# Patient Record
Sex: Female | Born: 1935
Health system: Southern US, Community
[De-identification: ages and names within clinical notes are randomized; demographics above are authoritative.]

## PROBLEM LIST (undated history)

## (undated) DIAGNOSIS — E079 Disorder of thyroid, unspecified: Secondary | ICD-10-CM

## (undated) DIAGNOSIS — K5790 Diverticulosis of intestine, part unspecified, without perforation or abscess without bleeding: Secondary | ICD-10-CM

## (undated) DIAGNOSIS — R740 Nonspecific elevation of levels of transaminase and lactic acid dehydrogenase [LDH]: Secondary | ICD-10-CM

## (undated) DIAGNOSIS — F411 Generalized anxiety disorder: Secondary | ICD-10-CM

## (undated) DIAGNOSIS — L02214 Cutaneous abscess of groin: Secondary | ICD-10-CM

## (undated) DIAGNOSIS — I10 Essential (primary) hypertension: Secondary | ICD-10-CM

## (undated) DIAGNOSIS — I872 Venous insufficiency (chronic) (peripheral): Secondary | ICD-10-CM

## (undated) DIAGNOSIS — R748 Abnormal levels of other serum enzymes: Secondary | ICD-10-CM

## (undated) DIAGNOSIS — E785 Hyperlipidemia, unspecified: Secondary | ICD-10-CM

## (undated) DIAGNOSIS — H8109 Meniere's disease, unspecified ear: Secondary | ICD-10-CM

## (undated) DIAGNOSIS — E039 Hypothyroidism, unspecified: Secondary | ICD-10-CM

## (undated) DIAGNOSIS — Z9109 Other allergy status, other than to drugs and biological substances: Secondary | ICD-10-CM

## (undated) DIAGNOSIS — B019 Varicella without complication: Secondary | ICD-10-CM

## (undated) DIAGNOSIS — R590 Localized enlarged lymph nodes: Secondary | ICD-10-CM

## (undated) DIAGNOSIS — A449 Bartonellosis, unspecified: Secondary | ICD-10-CM

## (undated) DIAGNOSIS — C4491 Basal cell carcinoma of skin, unspecified: Secondary | ICD-10-CM

## (undated) DIAGNOSIS — B159 Hepatitis A without hepatic coma: Secondary | ICD-10-CM

## (undated) DIAGNOSIS — R7402 Elevation of levels of lactic acid dehydrogenase (LDH): Secondary | ICD-10-CM

## (undated) DIAGNOSIS — H3530 Unspecified macular degeneration: Secondary | ICD-10-CM

## (undated) HISTORY — DX: Hyperlipidemia, unspecified: E78.5

## (undated) HISTORY — DX: Meniere's disease, unspecified ear: H81.09

## (undated) HISTORY — DX: Bartonellosis, unspecified: A44.9

## (undated) HISTORY — DX: Disorder of thyroid, unspecified: E07.9

## (undated) HISTORY — DX: Varicella without complication: B01.9

## (undated) HISTORY — DX: Abnormal levels of other serum enzymes: R74.8

## (undated) HISTORY — DX: Localized enlarged lymph nodes: R59.0

## (undated) HISTORY — PX: BLEPHAROPLASTY: SUR158

## (undated) HISTORY — DX: Unspecified macular degeneration: H35.30

## (undated) HISTORY — DX: Elevation of levels of lactic acid dehydrogenase (LDH): R74.02

## (undated) HISTORY — DX: Venous insufficiency (chronic) (peripheral): I87.2

## (undated) HISTORY — DX: Hepatitis a without hepatic coma: B15.9

## (undated) HISTORY — DX: Essential (primary) hypertension: I10

## (undated) HISTORY — DX: Other allergy status, other than to drugs and biological substances: Z91.09

## (undated) HISTORY — DX: Diverticulosis of intestine, part unspecified, without perforation or abscess without bleeding: K57.90

## (undated) HISTORY — DX: Hypothyroidism, unspecified: E03.9

## (undated) HISTORY — DX: Cutaneous abscess of groin: L02.214

## (undated) HISTORY — PX: CATARACT EXTRACTION W/ INTRAOCULAR LENS IMPLANT: SHX1309

## (undated) HISTORY — DX: Generalized anxiety disorder: F41.1

## (undated) HISTORY — DX: Basal cell carcinoma of skin, unspecified: C44.91

## (undated) HISTORY — DX: Nonspecific elevation of levels of transaminase and lactic acid dehydrogenase (ldh): R74.0

---

## 1965-02-05 HISTORY — PX: APPENDECTOMY: SHX54

## 1965-02-05 HISTORY — PX: CHOLECYSTECTOMY: SHX55

## 1989-02-05 HISTORY — PX: ABDOMINAL HYSTERECTOMY: SHX81

## 1995-02-06 DIAGNOSIS — C4491 Basal cell carcinoma of skin, unspecified: Secondary | ICD-10-CM

## 1995-02-06 HISTORY — PX: BASAL CELL CARCINOMA EXCISION: SHX1214

## 1995-02-06 HISTORY — DX: Basal cell carcinoma of skin, unspecified: C44.91

## 2015-12-15 ENCOUNTER — Encounter: Payer: Self-pay | Admitting: Family Medicine

## 2016-01-10 ENCOUNTER — Encounter: Payer: Self-pay | Admitting: Family Medicine

## 2016-01-10 ENCOUNTER — Ambulatory Visit (INDEPENDENT_AMBULATORY_CARE_PROVIDER_SITE_OTHER): Payer: Commercial Managed Care - HMO | Admitting: Family Medicine

## 2016-01-10 VITALS — BP 129/84 | HR 76 | Temp 98.2°F | Resp 20 | Ht 64.5 in | Wt 154.5 lb

## 2016-01-10 DIAGNOSIS — E079 Disorder of thyroid, unspecified: Secondary | ICD-10-CM | POA: Insufficient documentation

## 2016-01-10 DIAGNOSIS — H8109 Meniere's disease, unspecified ear: Secondary | ICD-10-CM | POA: Diagnosis not present

## 2016-01-10 DIAGNOSIS — Z7689 Persons encountering health services in other specified circumstances: Secondary | ICD-10-CM

## 2016-01-10 DIAGNOSIS — Z23 Encounter for immunization: Secondary | ICD-10-CM | POA: Diagnosis not present

## 2016-01-10 DIAGNOSIS — R8290 Unspecified abnormal findings in urine: Secondary | ICD-10-CM

## 2016-01-10 DIAGNOSIS — F411 Generalized anxiety disorder: Secondary | ICD-10-CM | POA: Insufficient documentation

## 2016-01-10 DIAGNOSIS — E785 Hyperlipidemia, unspecified: Secondary | ICD-10-CM | POA: Insufficient documentation

## 2016-01-10 DIAGNOSIS — R350 Frequency of micturition: Secondary | ICD-10-CM | POA: Diagnosis not present

## 2016-01-10 DIAGNOSIS — I10 Essential (primary) hypertension: Secondary | ICD-10-CM | POA: Insufficient documentation

## 2016-01-10 DIAGNOSIS — I872 Venous insufficiency (chronic) (peripheral): Secondary | ICD-10-CM

## 2016-01-10 DIAGNOSIS — E039 Hypothyroidism, unspecified: Secondary | ICD-10-CM | POA: Insufficient documentation

## 2016-01-10 DIAGNOSIS — H3530 Unspecified macular degeneration: Secondary | ICD-10-CM | POA: Insufficient documentation

## 2016-01-10 LAB — POC URINALSYSI DIPSTICK (AUTOMATED)
BILIRUBIN UA: NEGATIVE
Glucose, UA: NEGATIVE
KETONES UA: NEGATIVE
NITRITE UA: NEGATIVE
PH UA: 7.5
PROTEIN UA: NEGATIVE
Spec Grav, UA: 1.015
Urobilinogen, UA: 0.2

## 2016-01-10 MED ORDER — LISINOPRIL 5 MG PO TABS: 5.0000 mg | ORAL_TABLET | Freq: Every day | ORAL | 1 refills | Status: DC

## 2016-01-10 MED ORDER — NITROFURANTOIN MONOHYD MACRO 100 MG PO CAPS: 100.0000 mg | ORAL_CAPSULE | Freq: Two times a day (BID) | ORAL | 0 refills | Status: DC

## 2016-01-10 MED ORDER — TRIAMTERENE-HCTZ 37.5-25 MG PO CAPS: 1.0000 | ORAL_CAPSULE | Freq: Every day | ORAL | 1 refills | Status: DC

## 2016-01-10 MED ORDER — TRAZODONE HCL 50 MG PO TABS: 25.0000 mg | ORAL_TABLET | Freq: Every evening | ORAL | 0 refills | Status: DC | PRN

## 2016-01-10 MED ORDER — LEVOTHYROXINE SODIUM 125 MCG PO TABS: 125.0000 ug | ORAL_TABLET | Freq: Every day | ORAL | 1 refills | Status: DC

## 2016-01-10 NOTE — Patient Instructions (Signed)
It was a pleasure to meet you today.  I have called in an antibiotic at [facility] for your UTI. We will call you when your culture returns.  I have also called in your BP meds to [facility], follow up on chronic medical conditions every 6 months and your physical and medical wellness yearly.

## 2016-01-10 NOTE — Progress Notes (Signed)
Patient ID: Denise Fuller, female  DOB: September 24, 1935, 80 y.o.   MRN: [id number] Patient Care Team    Relationship Specialty Notifications Start End  Ma Hillock, DO PCP - General Family Medicine  01/10/16   Otelia Sergeant, OD Referring Physician   01/10/16    Comment: ophth- macular degeneration    Subjective:  Denise Fuller is a 80 y.o.  female present for new patient establishment. All past medical history, surgical history, allergies, family history, immunizations, medications and social history were obtained and entered in the electronic medical record today. All recent labs, ED visits and hospitalizations within the last year were reviewed. Pt is transferring from [place] secondary to her PCP retiring. She moved from [place] 05/2014.   Chronic medical conditions: Pt is being treated for Hypertension/hyperlipidemia and hypothyroidism through her prior PCP. Her last labs were collected 04/2015. She reports compliance with Dyazide, lisinopril  and synthroid daily. She takes 81 mg ASA daily.  04/27/2015:    - CBC, CMP WNL    - TSH 0.976   - lipid panel: Tchol 220, LDL 134, HDL 65. Pt currently not on medications.   Dysuria: Pt has complaint of dysuria of 1 week duration, after recent travel. She has taken AZO and cranberry extract to help with her symptoms.  She endorses urinary frequency, dysuria and (chronic) mild incontinence. She denies fever, chills, low back pain  or nausea.   Insomnia/anxiety: pt reports she very infrequently uses ativan 0.25 mg to fall asleep at night. She states she has noticed she is more sensitive to the medication and it "knocks her out". She reports she has taken this medication for multiple years. She has a current script. She reports she does have a "sleep hangover" with use of this medication and other sleeping pills.   Health maintenance:  Immunizations: tdap 1/12012, Influenza updated today (encouraged yearly), PNA series prevnar 13 10/2014 and  pneumovax 2012, zostavax completed 2012. DEXA: 2012 thorugh life [facility], and was "normal" per her report.   Depression screen PHQ 2/9 01/10/2016  Decreased Interest 0  Down, Depressed, Hopeless 0  PHQ - 2 Score 0    Immunization History  Administered Date(s) Administered  . Influenza, High Dose Seasonal PF 01/10/2016  . Influenza-Unspecified 10/27/2014  . Pneumococcal Conjugate-13 10/27/2014    Past Medical History:  Diagnosis Date  . BCC (basal cell carcinoma of skin) 1997   nose  . Chickenpox   . Chronic venous insufficiency   . Diverticulosis   . Elevated alkaline phosphatase level   . Elevated LDH   . Environmental allergies   . GAD (generalized anxiety disorder)   . Groin abscess    culture negative, doxy resolved abscess  . Hepatitis A   . Hyperlipidemia   . Hypertension   . Hypothyroidism   . Macular degeneration   . Meniere disease   . Thyroid disease    Allergies  Allergen Reactions  . Keflex [Cephalexin] Hives and Itching   Past Surgical History:  Procedure Laterality Date  . ABDOMINAL HYSTERECTOMY  1991  . APPENDECTOMY  1967  . BASAL CELL CARCINOMA EXCISION  1997   nose  . BLEPHAROPLASTY    . CHOLECYSTECTOMY  1967   Family History  Problem Relation Age of Onset  . Arthritis Mother   . Heart disease Mother   . Arthritis Father   . Aneurysm Father 43    brain  . Thyroid disease Daughter    Social History  Social History  . Marital status: Married    Spouse name: [name]  . Number of children: 3  . Years of education: N/A   Occupational History  . retired    Social History Main Topics  . Smoking status: Never Smoker  . Smokeless tobacco: Never Used  . Alcohol use No  . Drug use: No  . Sexual activity: Yes    Partners: Male     Comment: married   Other Topics Concern  . Not on file   Social History Narrative   Married to [place]. They have 3 children [name])   Retired.    Drinks caffeine, takes herbal remedies and  dialy vitamin.   Wears her seatbelt, smoke detector in the home   Wears dentures, no assistive devices for walking - independent.    Feels safe in her relationships.      Medication List       Accurate as of 01/10/16 10:07 AM. Always use your most recent med list.          aspirin EC 81 MG tablet Take 81 mg by mouth daily.   Biotin 5000 MCG Tabs Take by mouth.   calcium citrate-vitamin D 315-200 MG-UNIT tablet Commonly known as:  CITRACAL+D Take 1 tablet by mouth 2 (two) times daily.   co-enzyme Q-10 30 MG capsule Take 30 mg by mouth 3 (three) times daily.   Cranberry 500 MG Tabs Take 1 tablet by mouth 2 (two) times daily.   FLAX SEED OIL PO Take 1,300 mg by mouth 2 (two) times daily.   glucosamine-chondroitin 500-400 MG tablet Take 1 tablet by mouth 3 (three) times daily.   levothyroxine 125 MCG tablet Commonly known as:  SYNTHROID, LEVOTHROID Take 1 tablet (125 mcg total) by mouth daily.   lisinopril 5 MG tablet Commonly known as:  PRINIVIL,ZESTRIL Take 1 tablet (5 mg total) by mouth daily.   LORazepam 0.5 MG tablet Commonly known as:  ATIVAN Take 0.5 mg by mouth as needed.   nitrofurantoin (macrocrystal-monohydrate) 100 MG capsule Commonly known as:  MACROBID Take 1 capsule (100 mg total) by mouth 2 (two) times daily.   OMEGA-3 COMPLEX PO Take 360 mg by mouth daily.   polyethylene glycol packet Commonly known as:  MIRALAX / GLYCOLAX Take 17 g by mouth daily.   traZODone 50 MG tablet Commonly known as:  DESYREL Take 0.5-1 tablets (25-50 mg total) by mouth at bedtime as needed for sleep.   triamterene-hydrochlorothiazide 37.5-25 MG capsule Commonly known as:  DYAZIDE Take 1 each (1 capsule total) by mouth daily.   Vitamin D3 1000 units Caps Take by mouth.        Recent Results (from the past 2160 hour(s))  POCT Urinalysis Dipstick (Automated)     Status: Abnormal   Collection Time: 01/10/16  9:28 AM  Result Value Ref Range   Color, UA  yellow    Clarity, UA cloudy    Glucose, UA negative    Bilirubin, UA negative    Ketones, UA negative    Spec Grav, UA 1.015    Blood, UA small    pH, UA 7.5    Protein, UA negative    Urobilinogen, UA 0.2    Nitrite, UA negative    Leukocytes, UA large (3+) (A) Negative    Patient was never admitted.   ROS: 14 pt review of systems performed and negative (unless mentioned in an HPI)  Objective: BP 129/84 (BP Location: Left Arm, Patient  Position: Sitting, Cuff Size: Normal)   Pulse 76   Temp 98.2 F (36.8 C)   Resp 20   Ht 5' 4.5" (1.638 m)   Wt 154 lb 8 oz (70.1 kg)   SpO2 98%   BMI 26.11 kg/m  Gen: Afebrile. No acute distress. Nontoxic in appearance, well-developed, well-nourished, pleasant caucasian female.  HENT: AT. [place]. MMM Eyes:Pupils Equal Round Reactive to light, Extraocular movements intact,  Conjunctiva without redness, discharge or icterus. Neck/lymp/endocrine: Supple,mild chronic left ant cervical  lymphadenopathy, no thyromegaly CV: RRR no murmur, no edema, +2/4 P posterior tibialis pulses. no carotid bruits. No JVD. Chest: CTAB, no wheeze, rhonchi or crackles.  Abd: Soft. NTND. BS present. . Skin:  Warm and well-perfused. Skin intact. Neuro/Msk: Normal gait. PERLA. EOMi. Alert. Oriented x3.   Psych: Normal affect, dress and demeanor. Normal speech. Normal thought content and judgment.  Assessment/plan: Denise Fuller is a 80 y.o. female present for establishment of care with complaints of dysuria.   Hypothyroidism, unspecified type - stable - last TSH WNL by records in 04/2015, continue current dose, refills provided - levothyroxine (SYNTHROID, LEVOTHROID) 125 MCG tablet; Take 1 tablet (125 mcg total) by mouth daily.  Dispense: 90 tablet; Refill: 1 - will follow yearly (during yearly).   Essential hypertension - stable - low salt, diet and exercise.  - continue current regimen, well controlled.  - triamterene-hydrochlorothiazide (DYAZIDE) 37.5-25 MG  capsule; Take 1 each (1 capsule total) by mouth daily.  Dispense: 90 capsule; Refill: 1 - lisinopril (PRINIVIL,ZESTRIL) 5 MG tablet; Take 1 tablet (5 mg total) by mouth daily.  Dispense: 90 tablet; Refill: 1  Hyperlipidemia, unspecified hyperlipidemia type - stable H/o with last lipids only mildly elevated LDL 134, 04/2015.  Fasting labs with yearly in March. Currently not on medications, consider fish oil supplement if elevated.    Urinary frequency/abnormal  - new - POCT Urinalysis Dipstick (Automated): +leuks and blood.  - urine culture ordered.  - macrobid 100 mg BId x 7 days  Meniere's disease, unspecified laterality H/o, no complications.   Macular degeneration - follows every 6 months with ophthalmologist.  GAD (generalized anxiety disorder)/sleeping disorder - change in therapy - DC ativan - start trazodone 25-50 mg QHS.   Chronic venous insufficiency H/o, pt has compression stockings, but does not like to wear them. Encourage use.  Need for prophylactic vaccination and inoculation against influenza - Flu vaccine HIGH DOSE PF (Fluzone High dose)     Return in about 4 months (around 04/30/2016) for CPE, Medicare Wellness (31m).  Electronically signed by: Howard Pouch, DO [place]

## 2016-01-12 LAB — URINE CULTURE

## 2016-01-13 ENCOUNTER — Telehealth: Payer: Self-pay | Admitting: Family Medicine

## 2016-01-13 NOTE — Telephone Encounter (Signed)
Please call pt: - her urine culture was positive for E.coli bacteria. The abx she was placed on has coverage for this bacteria. Continue medication. No need to followup on this condition, as long as improving.   Yearly physical due end of March

## 2016-01-13 NOTE — Telephone Encounter (Signed)
Spoke with patient reviewed information and instructions. Patient verbalized understanding. 

## 2016-02-01 ENCOUNTER — Telehealth: Payer: Self-pay | Admitting: Family Medicine

## 2016-02-01 NOTE — Telephone Encounter (Signed)
Patient said she did get a little better over Christmas but has gotten worse.

## 2016-02-01 NOTE — Telephone Encounter (Signed)
Pt will need an appt with urine sample in order to provide care. Thank you.

## 2016-02-01 NOTE — Telephone Encounter (Signed)
Patient still experiencing urinary burning, no fever, no headache. Feels like she still have UTI. Please call.

## 2016-02-01 NOTE — Telephone Encounter (Signed)
Patient called back. Can she get a different antibiotic? Her urine is cloudy.

## 2016-02-01 NOTE — Telephone Encounter (Signed)
Called patient Denise Fuller scheduled patient for an appt.

## 2016-02-02 ENCOUNTER — Ambulatory Visit (INDEPENDENT_AMBULATORY_CARE_PROVIDER_SITE_OTHER): Payer: Commercial Managed Care - HMO | Admitting: Family Medicine

## 2016-02-02 ENCOUNTER — Encounter: Payer: Self-pay | Admitting: Family Medicine

## 2016-02-02 VITALS — BP 130/88 | HR 91 | Temp 98.0°F | Resp 20 | Wt 155.0 lb

## 2016-02-02 DIAGNOSIS — N390 Urinary tract infection, site not specified: Secondary | ICD-10-CM

## 2016-02-02 DIAGNOSIS — R8290 Unspecified abnormal findings in urine: Secondary | ICD-10-CM | POA: Diagnosis not present

## 2016-02-02 DIAGNOSIS — R350 Frequency of micturition: Secondary | ICD-10-CM

## 2016-02-02 LAB — POC URINALSYSI DIPSTICK (AUTOMATED)
Bilirubin, UA: NEGATIVE
Glucose, UA: NEGATIVE
Ketones, UA: NEGATIVE
NITRITE UA: NEGATIVE
PH UA: 6
PROTEIN UA: NEGATIVE
Spec Grav, UA: 1.015
Urobilinogen, UA: 1

## 2016-02-02 MED ORDER — CIPROFLOXACIN HCL 500 MG PO TABS: 500.0000 mg | ORAL_TABLET | Freq: Two times a day (BID) | ORAL | 0 refills | Status: DC

## 2016-02-02 NOTE — Patient Instructions (Signed)
It was a great to see you again.  Start the cipro every 12 hours for 7 days.  Drink plenty of water.  I want you to have a urine test after treatment on [id number] to make certain all bacteria resolved.

## 2016-02-02 NOTE — Progress Notes (Signed)
Patient ID: Denise Fuller, female  DOB: 08-02-1935, 80 y.o.   MRN: [id number] Patient Care Team    Relationship Specialty Notifications Start End  Ma Hillock, DO PCP - General Family Medicine  01/10/16   Otelia Sergeant, OD Referring Physician   01/10/16    Comment: ophth- macular degeneration    Subjective:  Denise Fuller is a 80 y.o.  female present for Recurrent UTI sx  Dysuria: Patient presents today for recurrent UTI symptoms after treatment with Macrobid for Escherichia coli UTI last month. Patient states that she had a few days in which she had complete resolution of her symptoms, and a symptoms slowly started to return. She endorses urinary frequency, mild dysuria, and feeling "off". She denies fever, chills, nausea, vomit or low back pain. She states she has had a few urinary tract infections in the past, but approximately the last few years. She has had bladder sling surgery, and endorses having continued bladder prolapse and urinary incontinence that is mild. She states at this time she does not desire to have the surgery repeated. She is allergic to Keflex.    Immunization History  Administered Date(s) Administered  . Influenza, High Dose Seasonal PF 01/10/2016  . Influenza-Unspecified 10/27/2014  . Pneumococcal Conjugate-13 10/27/2014    Past Medical History:  Diagnosis Date  . Bartonella infection   . BCC (basal cell carcinoma of skin) 1997   nose  . Chickenpox   . Chronic venous insufficiency   . Diverticulosis   . Elevated alkaline phosphatase level   . Elevated LDH   . Environmental allergies   . GAD (generalized anxiety disorder)   . Groin abscess    culture negative, doxy resolved abscess  . Hepatitis A   . Hyperlipidemia   . Hypertension   . Hypothyroidism   . Lymphadenopathy, cervical    chronic per pt. (left)  . Macular degeneration   . Meniere disease   . Thyroid disease    Allergies  Allergen Reactions  . Keflex [Cephalexin] Hives and  Itching   Past Surgical History:  Procedure Laterality Date  . ABDOMINAL HYSTERECTOMY  1991  . APPENDECTOMY  1967  . BASAL CELL CARCINOMA EXCISION  1997   nose  . BLEPHAROPLASTY    . CHOLECYSTECTOMY  1967   Family History  Problem Relation Age of Onset  . Arthritis Mother   . Heart disease Mother   . Arthritis Father   . Aneurysm Father 55    brain  . Thyroid disease Daughter    Social History   Social History  . Marital status: Married    Spouse name: [name]  . Number of children: 3  . Years of education: N/A   Occupational History  . retired    Social History Main Topics  . Smoking status: Never Smoker  . Smokeless tobacco: Never Used  . Alcohol use No  . Drug use: No  . Sexual activity: Yes    Partners: Male     Comment: married   Other Topics Concern  . Not on file   Social History Narrative   Retired. Married to [place]. They have 3 children [name])   [place] from [place] 05/2014.    Has 2 older dogs.    Retired.    Drinks caffeine, takes herbal remedies and dialy vitamin.   Wears her seatbelt, smoke detector in the home   Wears dentures, no assistive devices for walking - independent.  Feels safe in her relationships.    Allergies as of 02/02/2016      Reactions   Keflex [cephalexin] Hives, Itching      Medication List       Accurate as of 02/02/16 10:55 AM. Always use your most recent med list.          aspirin EC 81 MG tablet Take 81 mg by mouth daily.   Biotin 5000 MCG Tabs Take by mouth.   calcium citrate-vitamin D 315-200 MG-UNIT tablet Commonly known as:  CITRACAL+D Take 1 tablet by mouth 2 (two) times daily.   co-enzyme Q-10 30 MG capsule Take 30 mg by mouth 3 (three) times daily.   Cranberry 500 MG Tabs Take 1 tablet by mouth 2 (two) times daily.   FLAX SEED OIL PO Take 1,300 mg by mouth 2 (two) times daily.   glucosamine-chondroitin 500-400 MG tablet Take 1 tablet by mouth 3 (three) times daily.     levothyroxine 125 MCG tablet Commonly known as:  SYNTHROID, LEVOTHROID Take 1 tablet (125 mcg total) by mouth daily.   lisinopril 5 MG tablet Commonly known as:  PRINIVIL,ZESTRIL Take 1 tablet (5 mg total) by mouth daily.   LORazepam 0.5 MG tablet Commonly known as:  ATIVAN Take 0.5 mg by mouth as needed.   OMEGA-3 COMPLEX PO Take 360 mg by mouth daily.   polyethylene glycol packet Commonly known as:  MIRALAX / GLYCOLAX Take 17 g by mouth daily.   traZODone 50 MG tablet Commonly known as:  DESYREL Take 0.5-1 tablets (25-50 mg total) by mouth at bedtime as needed for sleep.   triamterene-hydrochlorothiazide 37.5-25 MG capsule Commonly known as:  DYAZIDE Take 1 each (1 capsule total) by mouth daily.   Vitamin D3 1000 units Caps Take by mouth.       ROS: 14 pt review of systems performed and negative (unless mentioned in an HPI)  Objective: BP 130/88 (BP Location: Right Arm, Patient Position: Sitting, Cuff Size: Normal)   Pulse 91   Temp 98 F (36.7 C)   Resp 20   Wt 155 lb (70.3 kg)   SpO2 98%   BMI 26.19 kg/m  Gen: Afebrile. No acute distress. Nontoxic in appearance, well-developed, well-nourished, Caucasian female. Very pleasant. HENT: AT. .  MMM.  Eyes:Pupils Equal Round Reactive to light, Extraocular movements intact,  Conjunctiva without redness, discharge or icterus. Abd: Soft. Flat. NTND. BS present. No Masses palpated.  MSK: No CVA tenderness bilaterally. Neuro:  Normal gait. PERLA. EOMi. Alert. Oriented.   Results for orders placed or performed in visit on 02/02/16 (from the past 24 hour(s))  POCT Urinalysis Dipstick (Automated)     Status: Abnormal   Collection Time: 02/02/16 10:47 AM  Result Value Ref Range   Color, UA yellow    Clarity, UA cloudy    Glucose, UA negative    Bilirubin, UA negative    Ketones, UA negative    Spec Grav, UA 1.015    Blood, UA small    pH, UA 6.0    Protein, UA negative    Urobilinogen, UA 1.0    Nitrite, UA  negative    Leukocytes, UA large (3+) (A) Negative    Assessment/plan: Denise Fuller is a 80 y.o. female present for with complaints of dysuria.  Urinary frequency/Abnormal urine findings/Recurrent UTI - POCT Urinalysis Dipstick (Automated): Positive findings consistent with recurrent UTI. - Urine Culture - Discussed the need to return today, and for future appointment if she is  having urinary tract infection symptoms. Patient understands what she needs to be seen for this condition. - Discussed trying different therapy with ciprofloxacin, and she is agreeable today. Although prior UTI was sensitive to the [place], lengthy discussion surrounding possible reasons of her having reoccurrence with her bladder prolapse progressing. - Patient will provide a urine sample to send for micro reflex and culture 3 days after being treated again for this UTI. No appointment needed unless symptoms are not resolved. - If urine test positive in early January, would favor sending to urology. Patient will be called with results from urine culture collected today and repeat results collected in early January. Patient is in agreement with this plan. Future order placed for urine.  > 25 minutes spent with patient, >50% of time spent face to face counseling and/or coordinating care   Electronically signed by: Howard Pouch, [place]

## 2016-02-04 LAB — URINE CULTURE: Colony Count: >=100000

## 2016-02-07 ENCOUNTER — Telehealth: Payer: Self-pay | Admitting: Family Medicine

## 2016-02-07 MED ORDER — SULFAMETHOXAZOLE-TRIMETHOPRIM 800-160 MG PO TABS: 1.0000 | ORAL_TABLET | Freq: Two times a day (BID) | ORAL | 0 refills | Status: DC

## 2016-02-07 NOTE — Telephone Encounter (Signed)
Please call pt: - her urine culture is positive for E.COli, it is however resistant to the Cipro we placed her on.  - I have called in another script, different abx.  - I had asked her to have a urine test after finishing cipro, since we are changing abx, I want her to postpone the retest until 3 days after abx has finished.  - bactrim prescribed BID for 10 days

## 2016-02-07 NOTE — Telephone Encounter (Signed)
Left message for patient to call back to review results and new instructions.

## 2016-02-07 NOTE — Telephone Encounter (Signed)
Patient notified and verbalized understanding. 

## 2016-02-27 ENCOUNTER — Other Ambulatory Visit: Payer: Commercial Managed Care - HMO

## 2016-02-27 ENCOUNTER — Telehealth: Payer: Self-pay | Admitting: Family Medicine

## 2016-02-27 DIAGNOSIS — N390 Urinary tract infection, site not specified: Secondary | ICD-10-CM

## 2016-02-27 LAB — URINALYSIS, ROUTINE W REFLEX MICROSCOPIC
Bilirubin Urine: NEGATIVE
Glucose, UA: NEGATIVE
HGB URINE DIPSTICK: NEGATIVE
Ketones, ur: NEGATIVE
LEUKOCYTES UA: NEGATIVE
Nitrite: NEGATIVE
PH: 7 (ref 5.0–8.0)
Protein, ur: NEGATIVE
SPECIFIC GRAVITY, URINE: 1.007 (ref 1.001–1.035)

## 2016-02-27 NOTE — Telephone Encounter (Signed)
Future orders in Epic patient to be added to lab schedule [name] aware to release orders.

## 2016-02-27 NOTE — Telephone Encounter (Signed)
Patient brought in urine specimen. It is in the lab. She said she was advised to bring it in due to several rounds of antibiotics.

## 2016-02-28 LAB — URINE CULTURE: Organism ID, Bacteria: NO GROWTH

## 2016-02-29 ENCOUNTER — Telehealth: Payer: Self-pay | Admitting: Family Medicine

## 2016-02-29 NOTE — Telephone Encounter (Signed)
Patient notified and verbalized understanding. 

## 2016-02-29 NOTE — Telephone Encounter (Signed)
Please call pt: Her urine culture is completely normal. Full resolution of infection.

## 2016-04-23 ENCOUNTER — Encounter: Payer: Self-pay | Admitting: Family Medicine

## 2016-04-23 ENCOUNTER — Ambulatory Visit (INDEPENDENT_AMBULATORY_CARE_PROVIDER_SITE_OTHER): Payer: Medicare HMO | Admitting: Family Medicine

## 2016-04-23 VITALS — BP 126/80 | HR 66 | Temp 98.0°F | Resp 20 | Ht 65.0 in | Wt 156.5 lb

## 2016-04-23 DIAGNOSIS — I10 Essential (primary) hypertension: Secondary | ICD-10-CM

## 2016-04-23 DIAGNOSIS — E039 Hypothyroidism, unspecified: Secondary | ICD-10-CM | POA: Diagnosis not present

## 2016-04-23 DIAGNOSIS — Z6826 Body mass index (BMI) 26.0-26.9, adult: Secondary | ICD-10-CM | POA: Diagnosis not present

## 2016-04-23 DIAGNOSIS — Z0000 Encounter for general adult medical examination without abnormal findings: Secondary | ICD-10-CM | POA: Diagnosis not present

## 2016-04-23 DIAGNOSIS — E785 Hyperlipidemia, unspecified: Secondary | ICD-10-CM | POA: Diagnosis not present

## 2016-04-23 DIAGNOSIS — Z131 Encounter for screening for diabetes mellitus: Secondary | ICD-10-CM | POA: Diagnosis not present

## 2016-04-23 LAB — CBC WITH DIFFERENTIAL/PLATELET
Basophils Absolute: 0 10*3/uL (ref 0.0–0.1)
Basophils Relative: 0.3 % (ref 0.0–3.0)
EOS ABS: 0.2 10*3/uL (ref 0.0–0.7)
Eosinophils Relative: 2.8 % (ref 0.0–5.0)
HEMATOCRIT: 43.5 % (ref 36.0–46.0)
HEMOGLOBIN: 14.4 g/dL (ref 12.0–15.0)
LYMPHS PCT: 38.7 % (ref 12.0–46.0)
Lymphs Abs: 2.4 10*3/uL (ref 0.7–4.0)
MCHC: 33.1 g/dL (ref 30.0–36.0)
MCV: 90.7 fl (ref 78.0–100.0)
MONO ABS: 1 10*3/uL (ref 0.1–1.0)
Monocytes Relative: 15.8 % — ABNORMAL HIGH (ref 3.0–12.0)
Neutro Abs: 2.6 10*3/uL (ref 1.4–7.7)
Neutrophils Relative %: 42.4 % — ABNORMAL LOW (ref 43.0–77.0)
Platelets: 305 10*3/uL (ref 150.0–400.0)
RBC: 4.8 Mil/uL (ref 3.87–5.11)
RDW: 14.3 % (ref 11.5–15.5)
WBC: 6.2 10*3/uL (ref 4.0–10.5)

## 2016-04-23 LAB — LIPID PANEL
CHOL/HDL RATIO: 4
Cholesterol: 256 mg/dL — ABNORMAL HIGH (ref 0–200)
HDL: 67.5 mg/dL (ref >39.00)
LDL CALC: 161 mg/dL — AB (ref 0–99)
NonHDL: 188.16
TRIGLYCERIDES: 136 mg/dL (ref 0.0–149.0)
VLDL: 27.2 mg/dL (ref 0.0–40.0)

## 2016-04-23 LAB — BASIC METABOLIC PANEL
BUN: 11 mg/dL (ref 6–23)
CHLORIDE: 99 meq/L (ref 96–112)
CO2: 31 meq/L (ref 19–32)
Calcium: 9.9 mg/dL (ref 8.4–10.5)
Creatinine, Ser: 0.63 mg/dL (ref 0.40–1.20)
GFR: 97.04 mL/min (ref >60.00)
Glucose, Bld: 83 mg/dL (ref 70–99)
POTASSIUM: 4.2 meq/L (ref 3.5–5.1)
SODIUM: 137 meq/L (ref 135–145)

## 2016-04-23 LAB — TSH: TSH: 1.79 u[IU]/mL (ref 0.35–4.50)

## 2016-04-23 MED ORDER — TRAZODONE HCL 50 MG PO TABS: 25.0000 mg | ORAL_TABLET | Freq: Every evening | ORAL | 1 refills | Status: DC | PRN

## 2016-04-23 MED ORDER — LISINOPRIL 5 MG PO TABS: 5.0000 mg | ORAL_TABLET | Freq: Every day | ORAL | 1 refills | Status: DC

## 2016-04-23 MED ORDER — TRIAMTERENE-HCTZ 37.5-25 MG PO CAPS: 1.0000 | ORAL_CAPSULE | Freq: Every day | ORAL | 1 refills | Status: DC

## 2016-04-23 NOTE — Patient Instructions (Signed)
Health Maintenance, Female Adopting a healthy lifestyle and getting preventive care can go a long way to promote health and wellness. Talk with your health care provider about what schedule of regular examinations is right for you. This is a good chance for you to check in with your provider about disease prevention and staying healthy. In between checkups, there are plenty of things you can do on your own. Experts have done a lot of research about which lifestyle changes and preventive measures are most likely to keep you healthy. Ask your health care provider for more information. Weight and diet Eat a healthy diet  Be sure to include plenty of vegetables, fruits, low-fat dairy products, and lean protein.  Do not eat a lot of foods high in solid fats, added sugars, or salt.  Get regular exercise. This is one of the most important things you can do for your health.  Most adults should exercise for at least 150 minutes each week. The exercise should increase your heart rate and make you sweat (moderate-intensity exercise).  Most adults should also do strengthening exercises at least twice a week. This is in addition to the moderate-intensity exercise. Maintain a healthy weight  Body mass index (BMI) is a measurement that can be used to identify possible weight problems. It estimates body fat based on height and weight. Your health care provider can help determine your BMI and help you achieve or maintain a healthy weight.  For females 76 years of age and older:  A BMI below 18.5 is considered underweight.  A BMI of 18.5 to 24.9 is normal.  A BMI of 25 to 29.9 is considered overweight.  A BMI of 30 and above is considered obese. Watch levels of cholesterol and blood lipids  You should start having your blood tested for lipids and cholesterol at 79 years of age, then have this test every 5 years.  You may need to have your cholesterol levels checked more often if:  Your lipid or  cholesterol levels are high.  You are older than 79 years of age.  You are at high risk for heart disease. Cancer screening Lung Cancer  Lung cancer screening is recommended for adults 64-42 years old who are at high risk for lung cancer because of a history of smoking.  A yearly low-dose CT scan of the lungs is recommended for people who:  Currently smoke.  Have quit within the past 15 years.  Have at least a 30-pack-year history of smoking. A pack year is smoking an average of one pack of cigarettes a day for 1 year.  Yearly screening should continue until it has been 15 years since you quit.  Yearly screening should stop if you develop a health problem that would prevent you from having lung cancer treatment. Breast Cancer  Practice breast self-awareness. This means understanding how your breasts normally appear and feel.  It also means doing regular breast self-exams. Let your health care provider know about any changes, no matter how small.  If you are in your 20s or 30s, you should have a clinical breast exam (CBE) by a health care provider every 1-3 years as part of a regular health exam.  If you are 34 or older, have a CBE every year. Also consider having a breast X-ray (mammogram) every year.  If you have a family history of breast cancer, talk to your health care provider about genetic screening.  If you are at high risk for breast cancer, talk  to your health care provider about having an MRI and a mammogram every year.  Breast cancer gene (BRCA) assessment is recommended for women who have family members with BRCA-related cancers. BRCA-related cancers include:  Breast.  Ovarian.  Tubal.  Peritoneal cancers.  Results of the assessment will determine the need for genetic counseling and BRCA1 and BRCA2 testing. Cervical Cancer  Your health care provider may recommend that you be screened regularly for cancer of the pelvic organs (ovaries, uterus, and vagina).  This screening involves a pelvic examination, including checking for microscopic changes to the surface of your cervix (Pap test). You may be encouraged to have this screening done every 3 years, beginning at age 24.  For women ages 66-65, health care providers may recommend pelvic exams and Pap testing every 3 years, or they may recommend the Pap and pelvic exam, combined with testing for human papilloma virus (HPV), every 5 years. Some types of HPV increase your risk of cervical cancer. Testing for HPV may also be done on women of any age with unclear Pap test results.  Other health care providers may not recommend any screening for nonpregnant women who are considered low risk for pelvic cancer and who do not have symptoms. Ask your health care provider if a screening pelvic exam is right for you.  If you have had past treatment for cervical cancer or a condition that could lead to cancer, you need Pap tests and screening for cancer for at least 20 years after your treatment. If Pap tests have been discontinued, your risk factors (such as having a new sexual partner) need to be reassessed to determine if screening should resume. Some women have medical problems that increase the chance of getting cervical cancer. In these cases, your health care provider may recommend more frequent screening and Pap tests. Colorectal Cancer  This type of cancer can be detected and often prevented.  Routine colorectal cancer screening usually begins at 79 years of age and continues through 79 years of age.  Your health care provider may recommend screening at an earlier age if you have risk factors for colon cancer.  Your health care provider may also recommend using home test kits to check for hidden blood in the stool.  A small camera at the end of a tube can be used to examine your colon directly (sigmoidoscopy or colonoscopy). This is done to check for the earliest forms of colorectal cancer.  Routine  screening usually begins at age 41.  Direct examination of the colon should be repeated every 5-10 years through 79 years of age. However, you may need to be screened more often if early forms of precancerous polyps or small growths are found. Skin Cancer  Check your skin from head to toe regularly.  Tell your health care provider about any new moles or changes in moles, especially if there is a change in a mole's shape or color.  Also tell your health care provider if you have a mole that is larger than the size of a pencil eraser.  Always use sunscreen. Apply sunscreen liberally and repeatedly throughout the day.  Protect yourself by wearing long sleeves, pants, a wide-brimmed hat, and sunglasses whenever you are outside. Heart disease, diabetes, and high blood pressure  High blood pressure causes heart disease and increases the risk of stroke. High blood pressure is more likely to develop in:  People who have blood pressure in the high end of the normal range (130-139/85-89 mm Hg).  People who are overweight or obese.  People who are African American.  If you are 59-24 years of age, have your blood pressure checked every 3-5 years. If you are 34 years of age or older, have your blood pressure checked every year. You should have your blood pressure measured twice-once when you are at a hospital or clinic, and once when you are not at a hospital or clinic. Record the average of the two measurements. To check your blood pressure when you are not at a hospital or clinic, you can use:  An automated blood pressure machine at a pharmacy.  A home blood pressure monitor.  If you are between 29 years and 60 years old, ask your health care provider if you should take aspirin to prevent strokes.  Have regular diabetes screenings. This involves taking a blood sample to check your fasting blood sugar level.  If you are at a normal weight and have a low risk for diabetes, have this test once  every three years after 79 years of age.  If you are overweight and have a high risk for diabetes, consider being tested at a younger age or more often. Preventing infection Hepatitis B  If you have a higher risk for hepatitis B, you should be screened for this virus. You are considered at high risk for hepatitis B if:  You were born in a country where hepatitis B is common. Ask your health care provider which countries are considered high risk.  Your parents were born in a high-risk country, and you have not been immunized against hepatitis B (hepatitis B vaccine).  You have HIV or AIDS.  You use needles to inject street drugs.  You live with someone who has hepatitis B.  You have had sex with someone who has hepatitis B.  You get hemodialysis treatment.  You take certain medicines for conditions, including cancer, organ transplantation, and autoimmune conditions. Hepatitis C  Blood testing is recommended for:  Everyone born from [birth year] through 1963.  Anyone with known risk factors for hepatitis C. Sexually transmitted infections (STIs)  You should be screened for sexually transmitted infections (STIs) including gonorrhea and chlamydia if:  You are sexually active and are younger than 79 years of age.  You are older than 79 years of age and your health care provider tells you that you are at risk for this type of infection.  Your sexual activity has changed since you were last screened and you are at an increased risk for chlamydia or gonorrhea. Ask your health care provider if you are at risk.  If you do not have HIV, but are at risk, it may be recommended that you take a prescription medicine daily to prevent HIV infection. This is called pre-exposure prophylaxis (PrEP). You are considered at risk if:  You are sexually active and do not regularly use condoms or know the HIV status of your partner(s).  You take drugs by injection.  You are sexually active with a partner  who has HIV. Talk with your health care provider about whether you are at high risk of being infected with HIV. If you choose to begin PrEP, you should first be tested for HIV. You should then be tested every 3 months for as long as you are taking PrEP. Pregnancy  If you are premenopausal and you may become pregnant, ask your health care provider about preconception counseling.  If you may become pregnant, take 400 to 800 micrograms (mcg) of folic acid  every day.  If you want to prevent pregnancy, talk to your health care provider about birth control (contraception). Osteoporosis and menopause  Osteoporosis is a disease in which the bones lose minerals and strength with aging. This can result in serious bone fractures. Your risk for osteoporosis can be identified using a bone density scan.  If you are 4 years of age or older, or if you are at risk for osteoporosis and fractures, ask your health care provider if you should be screened.  Ask your health care provider whether you should take a calcium or vitamin D supplement to lower your risk for osteoporosis.  Menopause may have certain physical symptoms and risks.  Hormone replacement therapy may reduce some of these symptoms and risks. Talk to your health care provider about whether hormone replacement therapy is right for you. Follow these instructions at home:  Schedule regular health, dental, and eye exams.  Stay current with your immunizations.  Do not use any tobacco products including cigarettes, chewing tobacco, or electronic cigarettes.  If you are pregnant, do not drink alcohol.  If you are breastfeeding, limit how much and how often you drink alcohol.  Limit alcohol intake to no more than 1 drink per day for nonpregnant women. One drink equals 12 ounces of beer, 5 ounces of wine, or 1 ounces of hard liquor.  Do not use street drugs.  Do not share needles.  Ask your health care provider for help if you need support  or information about quitting drugs.  Tell your health care provider if you often feel depressed.  Tell your health care provider if you have ever been abused or do not feel safe at home. This information is not intended to replace advice given to you by your health care provider. Make sure you discuss any questions you have with your health care provider. Document Released: 08/07/2010 Document Revised: 06/30/2015 Document Reviewed: 10/26/2014 Elsevier Interactive Patient Education  2017 [employer].

## 2016-04-23 NOTE — Progress Notes (Signed)
Patient ID: Denise Fuller, female  DOB: October 06, 1935, 81 y.o.   MRN: [id number] Patient Care Team    Relationship Specialty Notifications Start End  Ma Hillock, DO PCP - General Family Medicine  01/10/16   Otelia Sergeant, OD Referring Physician   01/10/16    Comment: ophth- macular degeneration    Subjective:  Denise Fuller is a 81 y.o.  Female  present for CPE. All past medical history, surgical history, allergies, family history, immunizations, medications and social history were updated in the electronic medical record today. All recent labs, ED visits and hospitalizations within the last year were reviewed.   Health maintenance:  Immunizations: tdap 1/12012, Influenza updated today (encouraged yearly), PNA series prevnar 13 10/2014 and pneumovax 2012, zostavax completed 2012. DEXA: 2012 thorugh life line, and was "normal" per her report.  Colonoscopy: N/A, > 75 Mammogram: N/A, > 75, does not desire further screening.  Assistive device: none Oxygen VQQ:VZDG Patient has a Dental home. Hospitalizations/ED visits: none  Depression screen [facility] 2/9 04/23/2016 01/10/2016  Decreased Interest 0 0  Down, Depressed, Hopeless 0 0  PHQ - 2 Score 0 0    Current Exercise Habits: The patient does not participate in regular exercise at present Exercise limited by: None identified   Immunization History  Administered Date(s) Administered  . Influenza, High Dose Seasonal PF 01/10/2016  . Influenza-Unspecified 10/27/2014  . Pneumococcal Conjugate-13 10/27/2014     Past Medical History:  Diagnosis Date  . Bartonella infection   . BCC (basal cell carcinoma of skin) 1997   nose  . Chickenpox   . Chronic venous insufficiency   . Diverticulosis   . Elevated alkaline phosphatase level   . Elevated LDH   . Environmental allergies   . GAD (generalized anxiety disorder)   . Groin abscess    culture negative, doxy resolved abscess  . Hepatitis A   . Hyperlipidemia   . Hypertension    . Hypothyroidism   . Lymphadenopathy, cervical    chronic per pt. (left)  . Macular degeneration   . Meniere disease   . Thyroid disease    Allergies  Allergen Reactions  . Keflex [Cephalexin] Hives and Itching   Past Surgical History:  Procedure Laterality Date  . ABDOMINAL HYSTERECTOMY  1991  . APPENDECTOMY  1967  . BASAL CELL CARCINOMA EXCISION  1997   nose  . BLEPHAROPLASTY    . CHOLECYSTECTOMY  1967   Family History  Problem Relation Age of Onset  . Arthritis Mother   . Heart disease Mother   . Arthritis Father   . Aneurysm Father 51    brain  . Thyroid disease Daughter    Social History   Social History  . Marital status: Married    Spouse name: [name]  . Number of children: 3  . Years of education: N/A   Occupational History  . retired    Social History Main Topics  . Smoking status: Never Smoker  . Smokeless tobacco: Never Used  . Alcohol use No  . Drug use: No  . Sexual activity: Yes    Partners: Male     Comment: married   Other Topics Concern  . Not on file   Social History Narrative   Retired. Married to [place]. They have 3 children [name])   [place] from [place] 05/2014.    Has 2 older dogs.    Retired.    Drinks caffeine, takes herbal remedies and  dialy vitamin.   Wears her seatbelt, smoke detector in the home   Wears dentures, no assistive devices for walking - independent.    Feels safe in her relationships.    Allergies as of 04/23/2016      Reactions   Keflex [cephalexin] Hives, Itching      Medication List       Accurate as of 04/23/16  8:58 AM. Always use your most recent med list.          aspirin EC 81 MG tablet Take 81 mg by mouth daily.   Biotin 5000 MCG Tabs Take by mouth.   calcium citrate-vitamin D 315-200 MG-UNIT tablet Commonly known as:  CITRACAL+D Take 1 tablet by mouth 2 (two) times daily.   co-enzyme Q-10 30 MG capsule Take 30 mg by mouth 3 (three) times daily.   Cranberry 500 MG  Tabs Take 1 tablet by mouth 2 (two) times daily.   FLAX SEED OIL PO Take 1,300 mg by mouth 2 (two) times daily.   glucosamine-chondroitin 500-400 MG tablet Take 1 tablet by mouth 3 (three) times daily.   levothyroxine 125 MCG tablet Commonly known as:  SYNTHROID, LEVOTHROID Take 1 tablet (125 mcg total) by mouth daily.   lisinopril 5 MG tablet Commonly known as:  PRINIVIL,ZESTRIL Take 1 tablet (5 mg total) by mouth daily.   OMEGA-3 COMPLEX PO Take 360 mg by mouth daily.   polyethylene glycol packet Commonly known as:  MIRALAX / GLYCOLAX Take 17 g by mouth daily.   sulfamethoxazole-trimethoprim 800-160 MG tablet Commonly known as:  BACTRIM DS,SEPTRA DS Take 1 tablet by mouth 2 (two) times daily.   traZODone 50 MG tablet Commonly known as:  DESYREL Take 0.5-1 tablets (25-50 mg total) by mouth at bedtime as needed for sleep.   triamterene-hydrochlorothiazide 37.5-25 MG capsule Commonly known as:  DYAZIDE Take 1 each (1 capsule total) by mouth daily.   Vitamin D3 1000 units Caps Take by mouth.      Patient was never admitted.   ROS: 14 pt review of systems performed and negative (unless mentioned in an HPI)  Objective: BP 126/80 (BP Location: Left Arm, Patient Position: Sitting, Cuff Size: Normal)   Pulse 66   Temp 98 F (36.7 C)   Resp 20   Ht 5\' 5"  (1.651 m)   Wt 156 lb 8 oz (71 kg)   SpO2 98%   BMI 26.04 kg/m  Gen: Afebrile. No acute distress. Nontoxic in appearance, well-developed, well-nourished,  Very pleasant caucasian female HENT: AT. [place]. Bilateral TM visualized and normal in appearance, normal external auditory canal. MMM, no oral lesions, adequate dentition. Bilateral nares within normal limits. Throat without erythema, ulcerations or exudates. no Cough on exam, no hoarseness on exam. Eyes:Pupils Equal Round Reactive to light, Extraocular movements intact,  Conjunctiva without redness, discharge or icterus. Neck/lymp/endocrine: Supple,no  lymphadenopathy, no thyromegaly CV: RRR no murmur, no edema, +2/4 P posterior tibialis pulses. no carotid bruits. No JVD. Chest: CTAB, no wheeze, rhonchi or crackles. normal Respiratory effort. Good  Air movement. Abd: Soft. flat. NTND. BS present. No  Masses palpated. No hepatosplenomegaly. No rebound tenderness or guarding. Skin: no rashes, purpura or petechiae. Warm and well-perfused. Skin intact. Neuro/Msk:  Normal gait. PERLA. EOMi. Alert. Oriented x3.  Cranial nerves II through XII intact. Muscle strength 5/5 upper/lower extremity. DTRs equal bilaterally. Psych: Normal affect, dress and demeanor. Normal speech. Normal thought content and judgment.   Assessment/plan: JOHARA LODWICK is a 81 y.o. female present  for CPE.  Hypothyroidism, unspecified type - Continue current regimen Levothyroxine 125 mg QD - TSH - Hemoglobin A1c Essential hypertension/Hyperlipidemia, unspecified hyperlipidemia type - stable. Continue current regimen. - lisinopril 5 mg qd - Continue ASA - diet and exercise rotuine - Lipid panel - CBC w/Diff - Basic Metabolic Panel (BMET) - TSH Visit for preventive health examination Patient was encouraged to exercise greater than 150 minutes a week. Patient was encouraged to choose a diet filled with fresh fruits and vegetables, and lean meats. AVS provided to patient today for education/recommendation on gender specific health and safety maintenance. Immunizations: tdap 1/12012, Influenza updated today (encouraged yearly), PNA series prevnar 13 10/2014 and pneumovax 2012, zostavax completed 2012. DEXA: 2012 thorugh life line, and was "normal" per her report.  Colonoscopy: N/A, > 75 Mammogram: N/A, > 75, does not desire further screening.   Return in about 1 year (around 04/23/2017) for CPE.  Electronically signed by: Howard Pouch, DO [place]

## 2016-04-24 ENCOUNTER — Telehealth: Payer: Self-pay | Admitting: Family Medicine

## 2016-04-24 DIAGNOSIS — E785 Hyperlipidemia, unspecified: Secondary | ICD-10-CM

## 2016-04-24 LAB — HEMOGLOBIN A1C
HEMOGLOBIN A1C: 4.9 % (ref <5.7)
Mean Plasma Glucose: 94 mg/dL

## 2016-04-24 NOTE — Telephone Encounter (Signed)
Please call pt: - all of her labs look great, with the exception of her cholesterol.  - Her total is 256 (Prior:220) and her "bad" or LDL cholesterol is 161 (prior:134)   - her good cholesterol is really good (67). I would recommend we try to get her LDL down < 130 by diet (high fiber, low saturated fats/sugars), exercise routinely and she could consider adding fish oil or krill oil or red yeast rice supplement to her daily regimen.  - We will recheck in 6 months after above changes are made on her next HTN follow up, and if elevated again above goal, given her h/o HTN and her fhx of heart disease in her mother, would then consider a medication for heart/CV protection after discussion.  - I have placed the future lab for lipids she can have a few days prior to her HTN f/u in 6 months.

## 2016-04-24 NOTE — Telephone Encounter (Signed)
Left message for patient to return call to review lab results and instructions.

## 2016-04-24 NOTE — Telephone Encounter (Signed)
Spoke with patient reviewed lab results and instructions. Patient verbalized understanding. 

## 2016-04-27 ENCOUNTER — Encounter: Payer: Commercial Managed Care - HMO | Admitting: Family Medicine

## 2016-04-30 ENCOUNTER — Encounter: Payer: Commercial Managed Care - HMO | Admitting: Family Medicine

## 2016-08-03 ENCOUNTER — Ambulatory Visit (INDEPENDENT_AMBULATORY_CARE_PROVIDER_SITE_OTHER): Payer: Medicare HMO | Admitting: Family Medicine

## 2016-08-03 ENCOUNTER — Encounter: Payer: Self-pay | Admitting: Family Medicine

## 2016-08-03 VITALS — BP 116/80 | HR 73 | Resp 20

## 2016-08-03 DIAGNOSIS — R350 Frequency of micturition: Secondary | ICD-10-CM | POA: Diagnosis not present

## 2016-08-03 DIAGNOSIS — N390 Urinary tract infection, site not specified: Secondary | ICD-10-CM | POA: Diagnosis not present

## 2016-08-03 DIAGNOSIS — R8290 Unspecified abnormal findings in urine: Secondary | ICD-10-CM

## 2016-08-03 DIAGNOSIS — R319 Hematuria, unspecified: Secondary | ICD-10-CM | POA: Diagnosis not present

## 2016-08-03 LAB — POC URINALSYSI DIPSTICK (AUTOMATED)
Bilirubin, UA: NEGATIVE
Glucose, UA: NEGATIVE
KETONES UA: NEGATIVE
Nitrite, UA: POSITIVE
PH UA: 5.5 (ref 5.0–8.0)
PROTEIN UA: NEGATIVE
SPEC GRAV UA: 1.01 (ref 1.010–1.025)
Urobilinogen, UA: 0.2 E.U./dL

## 2016-08-03 MED ORDER — SULFAMETHOXAZOLE-TRIMETHOPRIM 800-160 MG PO TABS: 1.0000 | ORAL_TABLET | Freq: Two times a day (BID) | ORAL | 0 refills | Status: DC

## 2016-08-03 NOTE — Patient Instructions (Signed)
Start the bactrim today every 12 hrs for 10 days.   Rest, hydrate, hydrate hydrate!!   It was great seeing you today.

## 2016-08-03 NOTE — Progress Notes (Signed)
Denise Fuller , 1936-02-05, 81 y.o., female MRN: [id number] Patient Care Team    Relationship Specialty Notifications Start End  Denise Hillock, Denise Fuller PCP - General Family Medicine  01/10/16   Denise Fuller, Denise Fuller Referring Physician   01/10/16    Comment: ophth- macular degeneration    Chief Complaint  Patient presents with  . Urinary Tract Infection     Subjective: Pt presents for an OV with complaints of urinary frequency of 1 day duration duration.  Associated symptoms include mild low abd pressure, mild dysuria. She has frequent UTI history. Bactrim typically works best for her. She denies fever, chills, nausea or vomit.   Depression screen [facility] 2/9 04/23/2016 01/10/2016  Decreased Interest 0 0  Down, Depressed, Hopeless 0 0  PHQ - 2 Score 0 0    Allergies  Allergen Reactions  . Keflex [Cephalexin] Hives and Itching   Social History  Substance Use Topics  . Smoking status: Never Smoker  . Smokeless tobacco: Never Used  . Alcohol use No   Past Medical History:  Diagnosis Date  . Bartonella infection   . BCC (basal cell carcinoma of skin) 1997   nose  . Chickenpox   . Chronic venous insufficiency   . Diverticulosis   . Elevated alkaline phosphatase level   . Elevated LDH   . Environmental allergies   . GAD (generalized anxiety disorder)   . Groin abscess    culture negative, doxy resolved abscess  . Hepatitis A   . Hyperlipidemia   . Hypertension   . Hypothyroidism   . Lymphadenopathy, cervical    chronic per pt. (left)  . Macular degeneration   . Meniere disease   . Thyroid disease    Past Surgical History:  Procedure Laterality Date  . ABDOMINAL HYSTERECTOMY  1991  . APPENDECTOMY  1967  . BASAL CELL CARCINOMA EXCISION  1997   nose  . BLEPHAROPLASTY    . CHOLECYSTECTOMY  1967   Family History  Problem Relation Age of Onset  . Arthritis Mother   . Heart disease Mother   . Arthritis Father   . Aneurysm Father 21       brain  . Thyroid disease  Daughter    Allergies as of 08/03/2016      Reactions   Keflex [cephalexin] Hives, Itching      Medication List       Accurate as of 08/03/16  3:13 PM. Always use your most recent med list.          aspirin EC 81 MG tablet Take 81 mg by mouth daily.   Biotin 5000 MCG Tabs Take by mouth.   calcium citrate-vitamin D 315-200 MG-UNIT tablet Commonly known as:  CITRACAL+D Take 1 tablet by mouth 2 (two) times daily.   co-enzyme Q-10 30 MG capsule Take 30 mg by mouth 3 (three) times daily.   Cranberry 500 MG Tabs Take 1 tablet by mouth 2 (two) times daily.   FLAX SEED OIL PO Take 1,300 mg by mouth 2 (two) times daily.   glucosamine-chondroitin 500-400 MG tablet Take 1 tablet by mouth 3 (three) times daily.   levothyroxine 125 MCG tablet Commonly known as:  SYNTHROID, LEVOTHROID Take 1 tablet (125 mcg total) by mouth daily.   lisinopril 5 MG tablet Commonly known as:  PRINIVIL,ZESTRIL Take 1 tablet (5 mg total) by mouth daily.   OMEGA-3 COMPLEX PO Take 360 mg by mouth daily.   polyethylene glycol packet Commonly known  as:  MIRALAX / GLYCOLAX Take 17 g by mouth daily.   traZODone 50 MG tablet Commonly known as:  DESYREL Take 0.5-1 tablets (25-50 mg total) by mouth at bedtime as needed for sleep.   triamterene-hydrochlorothiazide 37.5-25 MG capsule Commonly known as:  DYAZIDE Take 1 each (1 capsule total) by mouth daily.   Vitamin D3 1000 units Caps Take by mouth.       All past medical history, surgical history, allergies, family history, immunizations andmedications were updated in the EMR today and reviewed under the history and medication portions of their EMR.     ROS: Negative, with the exception of above mentioned in HPI   Objective:  BP 116/80 (BP Location: Left Arm, Patient Position: Sitting, Cuff Size: Normal)   Pulse 73   Resp 20   SpO2 98%  There is no height or weight on file to calculate BMI. Gen: Afebrile. No acute distress. Nontoxic in  appearance, well developed, well nourished.  HENT: AT. [place].  MMM, no oral lesions. Eyes:Pupils Equal Round Reactive to light, Extraocular movements intact,  Conjunctiva without redness, discharge or icterus. CV: RRR  Abd: Soft.  NTND. BS present MSK: NO CVA tenderness Neuro:  Normal gait. PERLA. EOMi. Alert. Oriented x3   No exam data present No results found. Results for orders placed or performed in visit on 08/03/16 (from the past 24 hour(s))  POCT Urinalysis Dipstick (Automated)     Status: Abnormal   Collection Time: 08/03/16  3:06 PM  Result Value Ref Range   Color, UA dark yellow    Clarity, UA slightly cloudy    Glucose, UA Negative    Bilirubin, UA Negative    Ketones, UA Negative    Spec Grav, UA 1.010 1.010 - 1.025   Blood, UA Moderate    pH, UA 5.5 5.0 - 8.0   Protein, UA Negative    Urobilinogen, UA 0.2 0.2 or 1.0 E.U./dL   Nitrite, UA Positive    Leukocytes, UA Large (3+) (A) Negative    Assessment/Plan: Denise Fuller is a 81 y.o. female present for OV for  Urinary frequency Urinary tract infection with hematuria, site unspecified Abnormal urine findings - POCT Urinalysis Dipstick (Automated)--> Large leuks, moderate blood - Urine Culture - treat with bactrim (works best for her) - rest, hydrate. - F/U PRN   Reviewed expectations re: course of current medical issues.  Discussed self-management of symptoms.  Outlined signs and symptoms indicating need for more acute intervention.  Patient verbalized understanding and all questions were answered.  Patient received an After-Visit Summary.    Orders Placed This Encounter  Procedures  . Urine Culture  . POCT Urinalysis Dipstick (Automated)    Note is dictated utilizing voice recognition software. Although note has been proof read prior to signing, occasional typographical errors still can be missed. If any questions arise, please Denise Fuller not hesitate to call for verification.   electronically signed  by:  Howard Pouch, Denise Fuller  [place]

## 2016-08-06 ENCOUNTER — Telehealth: Payer: Self-pay | Admitting: Family Medicine

## 2016-08-06 LAB — URINE CULTURE

## 2016-08-06 NOTE — Telephone Encounter (Signed)
Please call pt, her urine culture is positive for E. Coli and the abx prescribed will cover it.

## 2016-08-06 NOTE — Telephone Encounter (Signed)
Left message with results and instructions on patient voice mail per DPR 

## 2016-08-16 ENCOUNTER — Other Ambulatory Visit: Payer: Self-pay

## 2016-08-16 DIAGNOSIS — E039 Hypothyroidism, unspecified: Secondary | ICD-10-CM

## 2016-08-16 MED ORDER — LEVOTHYROXINE SODIUM 125 MCG PO TABS: 125.0000 ug | ORAL_TABLET | Freq: Every day | ORAL | 1 refills | Status: DC

## 2016-08-16 NOTE — Telephone Encounter (Signed)
Patient called requesting refill on thyroid medication. Refill sent to pharmacy requested.

## 2016-08-22 ENCOUNTER — Telehealth: Payer: Self-pay | Admitting: *Deleted

## 2016-08-22 NOTE — Telephone Encounter (Signed)
Patient had CPE 04/23/16 and feels like everything was covered at that time.

## 2016-10-02 DIAGNOSIS — H524 Presbyopia: Secondary | ICD-10-CM | POA: Diagnosis not present

## 2016-10-02 DIAGNOSIS — H5203 Hypermetropia, bilateral: Secondary | ICD-10-CM | POA: Diagnosis not present

## 2016-10-02 DIAGNOSIS — H1851 Endothelial corneal dystrophy: Secondary | ICD-10-CM | POA: Diagnosis not present

## 2016-10-02 DIAGNOSIS — H2513 Age-related nuclear cataract, bilateral: Secondary | ICD-10-CM | POA: Diagnosis not present

## 2016-10-02 DIAGNOSIS — H353131 Nonexudative age-related macular degeneration, bilateral, early dry stage: Secondary | ICD-10-CM | POA: Diagnosis not present

## 2016-10-02 DIAGNOSIS — H53022 Refractive amblyopia, left eye: Secondary | ICD-10-CM | POA: Diagnosis not present

## 2016-10-02 DIAGNOSIS — H52223 Regular astigmatism, bilateral: Secondary | ICD-10-CM | POA: Diagnosis not present

## 2016-10-30 DIAGNOSIS — H2512 Age-related nuclear cataract, left eye: Secondary | ICD-10-CM | POA: Diagnosis not present

## 2016-10-30 DIAGNOSIS — H2513 Age-related nuclear cataract, bilateral: Secondary | ICD-10-CM | POA: Diagnosis not present

## 2016-10-30 DIAGNOSIS — H02839 Dermatochalasis of unspecified eye, unspecified eyelid: Secondary | ICD-10-CM | POA: Diagnosis not present

## 2016-10-30 DIAGNOSIS — H25013 Cortical age-related cataract, bilateral: Secondary | ICD-10-CM | POA: Diagnosis not present

## 2016-10-30 DIAGNOSIS — H353130 Nonexudative age-related macular degeneration, bilateral, stage unspecified: Secondary | ICD-10-CM | POA: Diagnosis not present

## 2016-10-30 DIAGNOSIS — H25043 Posterior subcapsular polar age-related cataract, bilateral: Secondary | ICD-10-CM | POA: Diagnosis not present

## 2016-11-05 ENCOUNTER — Telehealth: Payer: Self-pay | Admitting: Family Medicine

## 2016-11-05 DIAGNOSIS — I10 Essential (primary) hypertension: Secondary | ICD-10-CM

## 2016-11-05 DIAGNOSIS — E039 Hypothyroidism, unspecified: Secondary | ICD-10-CM

## 2016-11-05 MED ORDER — LEVOTHYROXINE SODIUM 125 MCG PO TABS: 125.0000 ug | ORAL_TABLET | Freq: Every day | ORAL | 0 refills | Status: DC

## 2016-11-05 MED ORDER — LISINOPRIL 5 MG PO TABS: 5.0000 mg | ORAL_TABLET | Freq: Every day | ORAL | 0 refills | Status: DC

## 2016-11-05 NOTE — Telephone Encounter (Signed)
Spoke with patient refill sent to local pharmacy patient has appt for follow up.

## 2016-11-05 NOTE — Telephone Encounter (Signed)
Patient is requesting CB about Lisinopril Rx. She is about to run out.

## 2016-11-12 ENCOUNTER — Encounter: Payer: Self-pay | Admitting: Family Medicine

## 2016-11-12 ENCOUNTER — Ambulatory Visit (INDEPENDENT_AMBULATORY_CARE_PROVIDER_SITE_OTHER): Payer: Medicare HMO | Admitting: Family Medicine

## 2016-11-12 VITALS — BP 125/87 | HR 79 | Temp 98.0°F | Resp 20 | Wt 152.8 lb

## 2016-11-12 DIAGNOSIS — R350 Frequency of micturition: Secondary | ICD-10-CM

## 2016-11-12 DIAGNOSIS — R319 Hematuria, unspecified: Secondary | ICD-10-CM | POA: Diagnosis not present

## 2016-11-12 LAB — POC URINALSYSI DIPSTICK (AUTOMATED)
Bilirubin, UA: NEGATIVE
GLUCOSE UA: NEGATIVE
Ketones, UA: NEGATIVE
Nitrite, UA: NEGATIVE
PH UA: 7 (ref 5.0–8.0)
PROTEIN UA: NEGATIVE
SPEC GRAV UA: 1.015 (ref 1.010–1.025)
UROBILINOGEN UA: 0.2 U/dL

## 2016-11-12 MED ORDER — SULFAMETHOXAZOLE-TRIMETHOPRIM 800-160 MG PO TABS: 1.0000 | ORAL_TABLET | Freq: Two times a day (BID) | ORAL | 0 refills | Status: DC

## 2016-11-12 NOTE — Progress Notes (Signed)
Denise Fuller , August 06, 1935, 81 y.o., female MRN: [id number] Patient Care Team    Relationship Specialty Notifications Start End  Ma Hillock, DO PCP - General Family Medicine  01/10/16   Otelia Sergeant, OD Referring Physician   01/10/16    Comment: ophth- macular degeneration    Chief Complaint  Patient presents with  . Urinary Frequency    burning with urination     Subjective: Pt presents for an OV with complaints of urinary frequency of 3 days  duration.  Associated symptoms include burning with urination.  Pt denies fever, chills, nausea, vomit or back pain. She has a h/o frequent UTI. Pt has tried AZO to ease their symptoms.   Depression screen [facility] 2/9 04/23/2016 01/10/2016  Decreased Interest 0 0  Down, Depressed, Hopeless 0 0  PHQ - 2 Score 0 0    Allergies  Allergen Reactions  . Keflex [Cephalexin] Hives and Itching   Social History  Substance Use Topics  . Smoking status: Never Smoker  . Smokeless tobacco: Never Used  . Alcohol use No   Past Medical History:  Diagnosis Date  . Bartonella infection   . BCC (basal cell carcinoma of skin) 1997   nose  . Chickenpox   . Chronic venous insufficiency   . Diverticulosis   . Elevated alkaline phosphatase level   . Elevated LDH   . Environmental allergies   . GAD (generalized anxiety disorder)   . Groin abscess    culture negative, doxy resolved abscess  . Hepatitis A   . Hyperlipidemia   . Hypertension   . Hypothyroidism   . Lymphadenopathy, cervical    chronic per pt. (left)  . Macular degeneration   . Meniere disease   . Thyroid disease    Past Surgical History:  Procedure Laterality Date  . ABDOMINAL HYSTERECTOMY  1991  . APPENDECTOMY  1967  . BASAL CELL CARCINOMA EXCISION  1997   nose  . BLEPHAROPLASTY    . CHOLECYSTECTOMY  1967   Family History  Problem Relation Age of Onset  . Arthritis Mother   . Heart disease Mother   . Arthritis Father   . Aneurysm Father 73       brain  .  Thyroid disease Daughter    Allergies as of 11/12/2016      Reactions   Keflex [cephalexin] Hives, Itching      Medication List       Accurate as of 11/12/16  9:41 AM. Always use your most recent med list.          aspirin EC 81 MG tablet Take 81 mg by mouth daily.   Biotin 5000 MCG Tabs Take by mouth.   calcium citrate-vitamin D 315-200 MG-UNIT tablet Commonly known as:  CITRACAL+D Take 1 tablet by mouth 2 (two) times daily.   co-enzyme Q-10 30 MG capsule Take 30 mg by mouth 3 (three) times daily.   Cranberry 500 MG Tabs Take 1 tablet by mouth 2 (two) times daily.   DUREZOL 0.05 % Emul Generic drug:  Difluprednate INSTILL 1 DROP INTO LEFT EYE 3 TIMES A DAY   FLAX SEED OIL PO Take 1,300 mg by mouth 2 (two) times daily.   glucosamine-chondroitin 500-400 MG tablet Take 1 tablet by mouth 3 (three) times daily.   levothyroxine 125 MCG tablet Commonly known as:  SYNTHROID, LEVOTHROID Take 1 tablet (125 mcg total) by mouth daily.   lisinopril 5 MG tablet Commonly known as:  PRINIVIL,ZESTRIL Take 1 tablet (5 mg total) by mouth daily.   OMEGA-3 COMPLEX PO Take 360 mg by mouth daily.   polyethylene glycol packet Commonly known as:  MIRALAX / GLYCOLAX Take 17 g by mouth daily.   PROLENSA 0.07 % Soln Generic drug:  Bromfenac Sodium PUT 1 DROP INTO LEFT EYE AT BEDTIME   traZODone 50 MG tablet Commonly known as:  DESYREL Take 0.5-1 tablets (25-50 mg total) by mouth at bedtime as needed for sleep.   triamterene-hydrochlorothiazide 37.5-25 MG capsule Commonly known as:  DYAZIDE Take 1 each (1 capsule total) by mouth daily.   Vitamin D3 1000 units Caps Take by mouth.       All past medical history, surgical history, allergies, family history, immunizations andmedications were updated in the EMR today and reviewed under the history and medication portions of their EMR.     ROS: Negative, with the exception of above mentioned in HPI   Objective:  BP 125/87  (BP Location: Left Arm, Patient Position: Sitting, Cuff Size: Normal)   Pulse 79   Temp 98 F (36.7 C)   Resp 20   Wt 152 lb 12 oz (69.3 kg)   SpO2 97%   BMI 25.42 kg/m  Body mass index is 25.42 kg/m. Gen: Afebrile. No acute distress. Nontoxic in appearance, well developed, well nourished.  HENT: AT. [place].. MMM, no oral lesions.  Eyes:Pupils Equal Round Reactive to light, Extraocular movements intact,  Conjunctiva without redness, discharge or icterus. CV: RRR  Abd: Soft. NTND. BS present.  MSK: mild left CVA discomfort Neuro: Normal gait. PERLA. EOMi. Alert. Oriented x3  Psych: Normal affect, dress and demeanor. Normal speech. Normal thought content and judgment.  No exam data present No results found. Results for orders placed or performed in visit on 11/12/16 (from the past 24 hour(s))  POCT Urinalysis Dipstick (Automated)     Status: Abnormal   Collection Time: 11/12/16  9:41 AM  Result Value Ref Range   Color, UA yellow    Clarity, UA clear    Glucose, UA negative    Bilirubin, UA negative    Ketones, UA negative    Spec Grav, UA 1.015 1.010 - 1.025   Blood, UA small    pH, UA 7.0 5.0 - 8.0   Protein, UA negative    Urobilinogen, UA 0.2 0.2 or 1.0 E.U./dL   Nitrite, UA negative    Leukocytes, UA Small (1+) (A) Negative    Assessment/Plan: Denise Fuller is a 81 y.o. female present for OV for  Urinary frequency - POCT Urinalysis Dipstick (Automated) - Urine Culture Sent.  Hematuria, unspecified type - bactrim DS BID for 7 days prescribed.  - F/U PRN  Reviewed expectations re: course of current medical issues.  Discussed self-management of symptoms.  Outlined signs and symptoms indicating need for more acute intervention.  Patient verbalized understanding and all questions were answered.  Patient received an After-Visit Summary.   Orders Placed This Encounter  Procedures  . POCT Urinalysis Dipstick (Automated)   Note is dictated utilizing voice  recognition software. Although note has been proof read prior to signing, occasional typographical errors still can be missed. If any questions arise, please do not hesitate to call for verification.   electronically signed by:  Howard Pouch, DO  [place]

## 2016-11-12 NOTE — Patient Instructions (Signed)
We will call you with the culture results.  Start bactrim today for 7 days.   Please help [place] help you:  We are honored you have chosen [facility] for your Primary Care home. Below you will find basic instructions that you may need to access in the future. Please help [place] help you by reading the instructions, which cover many of the frequent questions we experience.   Prescription refills and request:  -In order to allow more efficient response time, please call your pharmacy for all refills. They will forward the request electronically to [place]. This allows for the quickest possible response. Request left on a nurse line can take longer to refill, since these are checked as time allows between office patients and other phone calls.  - refill request can take up to 3-5 working days to complete.  - If request is sent electronically and request is appropiate, it is usually completed in 1-2 business days.  - all patients will need to be seen routinely for all chronic medical conditions requiring prescription medications (see follow-up below). If you are overdue for follow up on your condition, you will be asked to make an appointment and we will call in enough medication to cover you until your appointment (up to 30 days).  - all controlled substances will require a face to face visit to request/refill.  - if you desire your prescriptions to go through a new pharmacy, and have an active script at original pharmacy, you will need to call your pharmacy and have scripts transferred to new pharmacy. This is completed between the pharmacy locations and not by your provider.    Results: If any images or labs were ordered, it can take up to 1 week to get results depending on the test ordered and the lab/facility running and resulting the test. - Normal or stable results, which do not need further discussion, may be released to your mychart immediately with attached note to you. A call may not be generated  for normal results. Please make certain to sign up for mychart. If you have questions on how to activate your mychart you can call the front office.  - If your results need further discussion, our office will attempt to contact you via phone, and if unable to reach you after 2 attempts, we will release your abnormal result to your mychart with instructions.  - All results will be automatically released in mychart after 1 week.  - Your provider will provide you with explanation and instruction on all relevant material in your results. Please keep in mind, results and labs may appear confusing or abnormal to the untrained eye, but it does not mean they are actually abnormal for you personally. If you have any questions about your results that are not covered, or you desire more detailed explanation than what was provided, you should make an appointment with your provider to do so.   Our office handles many outgoing and incoming calls daily. If we have not contacted you within 1 week about your results, please check your mychart to see if there is a message first and if not, then contact our office.  In helping with this matter, you help decrease call volume, and therefore allow [place] to be able to respond to patients needs more efficiently.   Acute office visits (sick visit):  An acute visit is intended for a new problem and are scheduled in shorter time slots to allow schedule openings for patients with new  problems. This is the appropriate visit to discuss a new problem. In order to provide you with excellent quality medical care with proper time for you to explain your problem, have an exam and receive treatment with instructions, these appointments should be limited to one new problem per visit. If you experience a new problem, in which you desire to be addressed, please make an acute office visit, we save openings on the schedule to accommodate you. Please do not save your new problem for any other type of  visit, let us take care of it properly and quickly for you.   Follow up visits:  Depending on your condition(s) your provider will need to see you routinely in order to provide you with quality care and prescribe medication(s). Most chronic conditions (Example: hypertension, Diabetes, depression/anxiety... etc), require visits a couple times a year. Your provider will instruct you on proper follow up for your personal medical conditions and history. Please make certain to make follow up appointments for your condition as instructed. Failing to do so could result in lapse in your medication treatment/refills. If you request a refill, and are overdue to be seen on a condition, we will always provide you with a 30 day script (once) to allow you time to schedule.    Medicare wellness (well visit): - we have a wonderful Nurse Maudie Mercury), that will meet with you and provide you will yearly medicare wellness visits. These visits should occur yearly (can not be scheduled less than 1 calendar year apart) and cover preventive health, immunizations, advance directives and screenings you are entitled to yearly through your medicare benefits. Do not miss out on your entitled benefits, this is when medicare will pay for these benefits to be ordered for you.  These are strongly encouraged by your provider and is the appropriate type of visit to make certain you are up to date with all preventive health benefits. If you have not had your medicare wellness exam in the last 12 months, please make certain to schedule one by calling the office and schedule your medicare wellness with Maudie Mercury as soon as possible.   Yearly physical (well visit):  - Adults are recommended to be seen yearly for physicals. Check with your insurance and date of your last physical, most insurances require one calendar year between physicals. Physicals include all preventive health topics, screenings, medical exam and labs that are appropriate for gender/age  and history. You may have fasting labs needed at this visit. This is a well visit (not a sick visit), new problems should not be covered during this visit (see acute visit).  - Pediatric patients are seen more frequently when they are younger. Your provider will advise you on well child visit timing that is appropriate for your their age. - This is not a medicare wellness visit. Medicare wellness exams do not have an exam portion to the visit. Some medicare companies allow for a physical, some do not allow a yearly physical. If your medicare allows a yearly physical you can schedule the medicare wellness with our nurse Maudie Mercury and have your physical with your provider after, on the same day. Please check with insurance for your full benefits.   Late Policy/No Shows:  - all new patients should arrive 15-30 minutes earlier than appointment to allow [place] time  to  obtain all personal demographics,  insurance information and for you to complete office paperwork. - All established patients should arrive 10-15 minutes earlier than appointment time to update all  information and be checked in .  - In our best efforts to run on time, if you are late for your appointment you will be asked to either reschedule or if able, we will work you back into the schedule. There will be a wait time to work you back in the schedule,  depending on availability.  - If you are unable to make it to your appointment as scheduled, please call 24 hours ahead of time to allow us to fill the time slot with someone else who needs to be seen. If you do not cancel your appointment ahead of time, you may be charged a no show fee.  

## 2016-11-15 LAB — URINE CULTURE
MICRO NUMBER: 81120048
SPECIMEN QUALITY:: ADEQUATE

## 2016-11-19 DIAGNOSIS — H2512 Age-related nuclear cataract, left eye: Secondary | ICD-10-CM | POA: Diagnosis not present

## 2016-11-20 DIAGNOSIS — H2511 Age-related nuclear cataract, right eye: Secondary | ICD-10-CM | POA: Diagnosis not present

## 2016-11-26 DIAGNOSIS — H2513 Age-related nuclear cataract, bilateral: Secondary | ICD-10-CM | POA: Diagnosis not present

## 2016-12-10 DIAGNOSIS — H2511 Age-related nuclear cataract, right eye: Secondary | ICD-10-CM | POA: Diagnosis not present

## 2016-12-18 DIAGNOSIS — H2513 Age-related nuclear cataract, bilateral: Secondary | ICD-10-CM | POA: Diagnosis not present

## 2017-01-09 DIAGNOSIS — Z961 Presence of intraocular lens: Secondary | ICD-10-CM | POA: Diagnosis not present

## 2017-01-30 ENCOUNTER — Other Ambulatory Visit: Payer: Self-pay | Admitting: *Deleted

## 2017-01-30 ENCOUNTER — Other Ambulatory Visit: Payer: Self-pay | Admitting: Family Medicine

## 2017-01-30 DIAGNOSIS — I10 Essential (primary) hypertension: Secondary | ICD-10-CM

## 2017-01-30 MED ORDER — LISINOPRIL 5 MG PO TABS: 5.0000 mg | ORAL_TABLET | Freq: Every day | ORAL | 0 refills | Status: DC

## 2017-01-30 NOTE — Telephone Encounter (Signed)
Copied from [place]. Topic: Quick Communication - Rx Refill/Question >> Jan 30, 2017 10:52 AM Oliver Pila B wrote: Has the patient contacted their pharmacy? No Pt has no refills left Asking for lisinopril Contact pt or pharmacy if needed

## 2017-01-30 NOTE — Telephone Encounter (Signed)
Refill sent for 30 day supply. Patient needs office visit prior to anymore refills.

## 2017-02-04 ENCOUNTER — Ambulatory Visit: Payer: Medicare HMO | Admitting: Family Medicine

## 2017-02-04 ENCOUNTER — Encounter: Payer: Self-pay | Admitting: Family Medicine

## 2017-02-04 VITALS — BP 108/74 | HR 87 | Temp 98.0°F | Resp 16 | Ht 65.0 in | Wt 152.2 lb

## 2017-02-04 DIAGNOSIS — B9789 Other viral agents as the cause of diseases classified elsewhere: Secondary | ICD-10-CM | POA: Diagnosis not present

## 2017-02-04 DIAGNOSIS — J069 Acute upper respiratory infection, unspecified: Secondary | ICD-10-CM

## 2017-02-04 NOTE — Progress Notes (Signed)
OFFICE VISIT  02/04/2017   CC:  Chief Complaint  Patient presents with  . Hoarse  . Cough   HPI:    Patient is a 81 y.o.  female who presents for respiratory complaints. Onset 24 hours ago: nasal cong/runny nose, PND cough, greenish phlegm production.  Some ST and hoarse voice with it. No fever.  No SOB, no wheezing.  Some HA in frontal region from coughing.  No n/v/d.  Some mild achiness.  +tired. She has had flu vaccine this year.  Past Medical History:  Diagnosis Date  . Bartonella infection   . BCC (basal cell carcinoma of skin) 1997   nose  . Chickenpox   . Chronic venous insufficiency   . Diverticulosis   . Elevated alkaline phosphatase level   . Elevated LDH   . Environmental allergies   . GAD (generalized anxiety disorder)   . Groin abscess    culture negative, doxy resolved abscess  . Hepatitis A   . Hyperlipidemia   . Hypertension   . Hypothyroidism   . Lymphadenopathy, cervical    chronic per pt. (left)  . Macular degeneration   . Meniere disease   . Thyroid disease     Past Surgical History:  Procedure Laterality Date  . ABDOMINAL HYSTERECTOMY  1991  . APPENDECTOMY  1967  . BASAL CELL CARCINOMA EXCISION  1997   nose  . BLEPHAROPLASTY    . CHOLECYSTECTOMY  1967    Outpatient Medications Prior to Visit  Medication Sig Dispense Refill  . aspirin EC 81 MG tablet Take 81 mg by mouth daily.    . Biotin 5000 MCG TABS Take by mouth.    . calcium citrate-vitamin D (CITRACAL+D) 315-200 MG-UNIT tablet Take 1 tablet by mouth 2 (two) times daily.    . Cholecalciferol (VITAMIN D3) 1000 units CAPS Take by mouth.    . co-enzyme Q-10 30 MG capsule Take 30 mg by mouth 3 (three) times daily.    . Cranberry 500 MG TABS Take 1 tablet by mouth 2 (two) times daily.    . DHA-EPA-Vitamin E (OMEGA-3 COMPLEX PO) Take 360 mg by mouth daily.    . Flaxseed, Linseed, (FLAX SEED OIL PO) Take 1,300 mg by mouth 2 (two) times daily.    Marland Kitchen glucosamine-chondroitin 500-400 MG  tablet Take 1 tablet by mouth 3 (three) times daily.    Marland Kitchen levothyroxine (SYNTHROID, LEVOTHROID) 125 MCG tablet Take 1 tablet (125 mcg total) by mouth daily. 90 tablet 0  . lisinopril (PRINIVIL,ZESTRIL) 5 MG tablet Take 1 tablet (5 mg total) by mouth daily. Needs office visit prior to anymore refills 30 tablet 0  . polyethylene glycol (MIRALAX / GLYCOLAX) packet Take 17 g by mouth daily.    . traZODone (DESYREL) 50 MG tablet Take 0.5-1 tablets (25-50 mg total) by mouth at bedtime as needed for sleep. 90 tablet 1  . triamterene-hydrochlorothiazide (DYAZIDE) 37.5-25 MG capsule Take 1 each (1 capsule total) by mouth daily. 90 capsule 1  . DUREZOL 0.05 % EMUL INSTILL 1 DROP INTO LEFT EYE 3 TIMES A DAY  1  . PROLENSA 0.07 % SOLN PUT 1 DROP INTO LEFT EYE AT BEDTIME  1  . sulfamethoxazole-trimethoprim (BACTRIM DS,SEPTRA DS) 800-160 MG tablet Take 1 tablet by mouth 2 (two) times daily. (Patient not taking: Reported on 02/04/2017) 14 tablet 0   No facility-administered medications prior to visit.     Allergies  Allergen Reactions  . Keflex [Cephalexin] Hives and Itching    ROS  As per HPI  PE: Blood pressure 108/74, pulse 87, temperature 98 F (36.7 C), temperature source Oral, resp. rate 16, height 5\' 5"  (1.651 m), weight 152 lb 4 oz (69.1 kg), SpO2 98 %. VS: noted--normal. Gen: alert, NAD, NONTOXIC APPEARING. HEENT: eyes without injection, drainage, or swelling.  Ears: EACs clear, TMs with normal light reflex and landmarks.  Nose: Clear rhinorrhea, with some dried, crusty exudate adherent to mildly injected mucosa.  No purulent d/c.  No paranasal sinus TTP.  No facial swelling.  Throat and mouth without focal lesion.  No pharyngial swelling, erythema, or exudate.   Neck: supple, no LAD.   LUNGS: CTA bilat, nonlabored resps.   CV: RRR, no m/r/g. EXT: no c/c/e SKIN: no rash  LABS:    Chemistry      Component Value Date/Time   NA 137 04/23/2016 0857   K 4.2 04/23/2016 0857   CL 99  04/23/2016 0857   CO2 31 04/23/2016 0857   BUN 11 04/23/2016 0857   CREATININE 0.63 04/23/2016 0857      Component Value Date/Time   CALCIUM 9.9 04/23/2016 0857      IMPRESSION AND PLAN:  Viral URI with cough. Trial of mucinex DM or robitussin DM otc as directed on the box. May use OTC nasal saline spray or irrigation solution bid. OTC nonsedating antihistamines prn discussed.  Decongestant use discussed--ok if tolerated in the past w/out side effect and if pt has no hx of HTN. Encouraged frequent hand washing, wear mask around others if they are at high risk of problems from this type of illness. Signs/symptoms to call or return for were reviewed and pt expressed understanding.  An After Visit Summary was printed and given to the patient.  FOLLOW UP: Return if symptoms worsen or fail to improve.  Signed:  Crissie Sickles, MD           02/04/2017

## 2017-02-08 ENCOUNTER — Ambulatory Visit (INDEPENDENT_AMBULATORY_CARE_PROVIDER_SITE_OTHER): Payer: Medicare HMO | Admitting: Family Medicine

## 2017-02-08 ENCOUNTER — Encounter: Payer: Self-pay | Admitting: Family Medicine

## 2017-02-08 VITALS — BP 120/78 | HR 84 | Temp 98.3°F

## 2017-02-08 DIAGNOSIS — J320 Chronic maxillary sinusitis: Secondary | ICD-10-CM

## 2017-02-08 NOTE — Patient Instructions (Signed)
Could not complete secondary to power outage.

## 2017-02-08 NOTE — Progress Notes (Signed)
Denise Fuller , [birth year], 82 y.o., female MRN: [id number] Patient Care Team    Relationship Specialty Notifications Start End  Ma Hillock, DO PCP - General Family Medicine  01/10/16   Otelia Sergeant, OD Referring Physician   01/10/16    Comment: ophth- macular degeneration    Chief Complaint  Patient presents with  . URI     Subjective: Pt presents for an OV with complaints of cough of 10-14 days duration.  Associated symptoms include productive cough, nasal congestion, nasal drainage, hoarseness and fatigue. Patient also endorses decreased appetite. She denies fever, chills, nausea, vomiting or diarrhea. She is taking over-the-counter cough syrup. She was seen 5 days ago for upper respiratory infection, diagnosed with viral in advised to use over-the-counter supportive therapy. She reports she has been unable to sleep soundly and has not for the night coughing until she is getting worse.  Depression screen [facility] 2/9 04/23/2016 01/10/2016  Decreased Interest 0 0  Down, Depressed, Hopeless 0 0  PHQ - 2 Score 0 0    Allergies  Allergen Reactions  . Keflex [Cephalexin] Hives and Itching   Social History   Tobacco Use  . Smoking status: Never Smoker  . Smokeless tobacco: Never Used  Substance Use Topics  . Alcohol use: No   Past Medical History:  Diagnosis Date  . Bartonella infection   . BCC (basal cell carcinoma of skin) 1997   nose  . Chickenpox   . Chronic venous insufficiency   . Diverticulosis   . Elevated alkaline phosphatase level   . Elevated LDH   . Environmental allergies   . GAD (generalized anxiety disorder)   . Groin abscess    culture negative, doxy resolved abscess  . Hepatitis A   . Hyperlipidemia   . Hypertension   . Hypothyroidism   . Lymphadenopathy, cervical    chronic per pt. (left)  . Macular degeneration   . Meniere disease   . Thyroid disease    Past Surgical History:  Procedure Laterality Date  . ABDOMINAL HYSTERECTOMY  1991  .  APPENDECTOMY  1967  . BASAL CELL CARCINOMA EXCISION  1997   nose  . BLEPHAROPLASTY    . CHOLECYSTECTOMY  1967   Family History  Problem Relation Age of Onset  . Arthritis Mother   . Heart disease Mother   . Arthritis Father   . Aneurysm Father 52       brain  . Thyroid disease Daughter    Allergies as of 02/08/2017      Reactions   Keflex [cephalexin] Hives, Itching      Medication List        Accurate as of 02/08/17  3:02 PM. Always use your most recent med list.          aspirin EC 81 MG tablet Take 81 mg by mouth daily.   Biotin 5000 MCG Tabs Take by mouth.   calcium citrate-vitamin D 315-200 MG-UNIT tablet Commonly known as:  CITRACAL+D Take 1 tablet by mouth 2 (two) times daily.   co-enzyme Q-10 30 MG capsule Take 30 mg by mouth 3 (three) times daily.   Cranberry 500 MG Tabs Take 1 tablet by mouth 2 (two) times daily.   DUREZOL 0.05 % Emul Generic drug:  Difluprednate INSTILL 1 DROP INTO LEFT EYE 3 TIMES A DAY   FLAX SEED OIL PO Take 1,300 mg by mouth 2 (two) times daily.   glucosamine-chondroitin 500-400 MG tablet Take 1 tablet  by mouth 3 (three) times daily.   levothyroxine 125 MCG tablet Commonly known as:  SYNTHROID, LEVOTHROID Take 1 tablet (125 mcg total) by mouth daily.   lisinopril 5 MG tablet Commonly known as:  PRINIVIL,ZESTRIL Take 1 tablet (5 mg total) by mouth daily. Needs office visit prior to anymore refills   OMEGA-3 COMPLEX PO Take 360 mg by mouth daily.   polyethylene glycol packet Commonly known as:  MIRALAX / GLYCOLAX Take 17 g by mouth daily.   PROLENSA 0.07 % Soln Generic drug:  Bromfenac Sodium PUT 1 DROP INTO LEFT EYE AT BEDTIME   sulfamethoxazole-trimethoprim 800-160 MG tablet Commonly known as:  BACTRIM DS,SEPTRA DS Take 1 tablet by mouth 2 (two) times daily.   traZODone 50 MG tablet Commonly known as:  DESYREL Take 0.5-1 tablets (25-50 mg total) by mouth at bedtime as needed for sleep.     triamterene-hydrochlorothiazide 37.5-25 MG capsule Commonly known as:  DYAZIDE Take 1 each (1 capsule total) by mouth daily.   Vitamin D3 1000 units Caps Take by mouth.       All past medical history, surgical history, allergies, family history, immunizations andmedications were updated in the EMR today and reviewed under the history and medication portions of their EMR.     ROS: Negative, with the exception of above mentioned in HPI   Objective:  BP 120/78   Pulse 84   Temp 98.3 F (36.8 C)  There is no height or weight on file to calculate BMI. (power outage prevented) Gen: Afebrile. No acute distress. Nontoxic in appearance, well developed, well nourished. Pleasant Caucasian female. HENT: AT. [place]. MMM, no oral lesions. Bilateral nares with erythema and drainage. Throat without erythema or exudates. Postnasal drip present. Cough present. Mild hoarseness present. Tender palpation maxillary sinus. Eyes:Pupils Equal Round Reactive to light, Extraocular movements intact,  Conjunctiva without redness, discharge or icterus. Neck/lymp/endocrine: Supple, no lymphadenopathy CV: RRR no murmur, no edema Chest: CTAB, no wheeze or crackles. Good air movement, normal resp effort.  Abd: Soft. NTND. BS present.  Skin: no rashes, purpura or petechiae.  Neuro:  Normal gait. PERLA. EOMi. Alert. Oriented x3  No exam data present No results found. No results found for this or any previous visit (from the past 24 hour(s)).  Assessment/Plan: Denise Fuller is a 82 y.o. female present for OV for  Maxillary sinusitis, unspecified chronicity Rest, hydrate.  + flonase, mucinex (DM if cough), nettie pot or nasal saline.  Tessalon Perles for cough and doxycycline twice a day prescribed, take until completed.  If cough present it can last up to 6-8 weeks.  F/U 2 weeks of not improved.    Reviewed expectations re: course of current medical issues.  Discussed self-management of symptoms.  Outlined  signs and symptoms indicating need for more acute intervention.  Patient verbalized understanding and all questions were answered.  Patient did not receive an After-Visit Summary secondary to power outage in the office.    No orders of the defined types were placed in this encounter.    Note is dictated utilizing voice recognition software. Although note has been proof read prior to signing, occasional typographical errors still can be missed. If any questions arise, please do not hesitate to call for verification.   electronically signed by:  Howard Pouch, DO  [place]

## 2017-02-26 ENCOUNTER — Other Ambulatory Visit: Payer: Self-pay | Admitting: *Deleted

## 2017-02-26 DIAGNOSIS — I10 Essential (primary) hypertension: Secondary | ICD-10-CM

## 2017-02-26 MED ORDER — LISINOPRIL 5 MG PO TABS: 5.0000 mg | ORAL_TABLET | Freq: Every day | ORAL | 0 refills | Status: DC

## 2017-03-18 ENCOUNTER — Encounter: Payer: Self-pay | Admitting: Family Medicine

## 2017-03-18 ENCOUNTER — Ambulatory Visit (INDEPENDENT_AMBULATORY_CARE_PROVIDER_SITE_OTHER): Payer: Medicare HMO | Admitting: Family Medicine

## 2017-03-18 VITALS — BP 113/73 | HR 77 | Temp 97.9°F | Resp 20 | Wt 149.5 lb

## 2017-03-18 DIAGNOSIS — F411 Generalized anxiety disorder: Secondary | ICD-10-CM

## 2017-03-18 DIAGNOSIS — E785 Hyperlipidemia, unspecified: Secondary | ICD-10-CM | POA: Diagnosis not present

## 2017-03-18 DIAGNOSIS — I10 Essential (primary) hypertension: Secondary | ICD-10-CM | POA: Diagnosis not present

## 2017-03-18 DIAGNOSIS — R5383 Other fatigue: Secondary | ICD-10-CM | POA: Diagnosis not present

## 2017-03-18 DIAGNOSIS — E039 Hypothyroidism, unspecified: Secondary | ICD-10-CM | POA: Diagnosis not present

## 2017-03-18 LAB — TSH: TSH: 0.24 u[IU]/mL — AB (ref 0.35–4.50)

## 2017-03-18 MED ORDER — TRIAMTERENE-HCTZ 37.5-25 MG PO CAPS: 1.0000 | ORAL_CAPSULE | Freq: Every day | ORAL | 1 refills | Status: DC

## 2017-03-18 MED ORDER — LISINOPRIL 5 MG PO TABS: 5.0000 mg | ORAL_TABLET | Freq: Every day | ORAL | 1 refills | Status: DC

## 2017-03-18 MED ORDER — LISINOPRIL 5 MG PO TABS: 5.0000 mg | ORAL_TABLET | Freq: Every day | ORAL | 0 refills | Status: DC

## 2017-03-18 MED ORDER — TRAZODONE HCL 50 MG PO TABS: 25.0000 mg | ORAL_TABLET | Freq: Every evening | ORAL | 1 refills | Status: DC | PRN

## 2017-03-18 NOTE — Patient Instructions (Addendum)
I have refilled your blood pressure medications.  We will call you with the results once avialable.    Start taking an antihistamine nightly.    It was great to see you today!

## 2017-03-18 NOTE — Addendum Note (Signed)
Addended by: Gordy Councilman on: 03/18/2017 12:02 PM   Modules accepted: Orders

## 2017-03-18 NOTE — Progress Notes (Signed)
Denise Fuller , [birth year], 82 y.o., female MRN: [id number] Patient Care Team    Relationship Specialty Notifications Start End  Ma Hillock, DO PCP - General Family Medicine  01/10/16   Otelia Sergeant, OD Referring Physician   01/10/16    Comment: ophth- macular degeneration    Chief Complaint  Patient presents with  . Hypothyroidism  . Hypertension     Subjective:   Hypertension/HLD:  Pt reports compliance with lisinopril 5 mg daily and Dyazide 37.5-25 milligrams daily. Blood pressures ranges at home WNL. Patient denies chest pain, shortness of breath or lower extremity edema. Pt takes a daily baby ASA. Pt is not prescribed statin. BMP: 04/23/2016 within normal limits CBC: 04/22/2016 within normal limits Lipids: Total cholesterol 256, LDL 161, HDL 67, triglycerides 136 Diet: Low-sodium Exercise: Routine exercise RF: Hypertension, hyperlipidemia, family history heart disease  Hypothyroidism/fatigue: Patient reports compliance with levothyroxine 125 g daily. She is feeling more fatigued than usual and wondering if her thyroid medication needs altered. She denies any flushing, palpitations, diarrhea or constipation.  Generalized anxiety disorder: Occasionally uses trazodone daily at bedtime. Doing well on that.  Depression screen [facility] 2/9 04/23/2016 01/10/2016  Decreased Interest 0 0  Down, Depressed, Hopeless 0 0  PHQ - 2 Score 0 0    Allergies  Allergen Reactions  . Doxycycline     GI upset  . Keflex [Cephalexin] Hives and Itching   Social History   Tobacco Use  . Smoking status: Never Smoker  . Smokeless tobacco: Never Used  Substance Use Topics  . Alcohol use: No   Past Medical History:  Diagnosis Date  . Bartonella infection   . BCC (basal cell carcinoma of skin) 1997   nose  . Chickenpox   . Chronic venous insufficiency   . Diverticulosis   . Elevated alkaline phosphatase level   . Elevated LDH   . Environmental allergies   . GAD (generalized  anxiety disorder)   . Groin abscess    culture negative, doxy resolved abscess  . Hepatitis A   . Hyperlipidemia   . Hypertension   . Hypothyroidism   . Lymphadenopathy, cervical    chronic per pt. (left)  . Macular degeneration   . Meniere disease   . Thyroid disease    Past Surgical History:  Procedure Laterality Date  . ABDOMINAL HYSTERECTOMY  1991  . APPENDECTOMY  1967  . BASAL CELL CARCINOMA EXCISION  1997   nose  . BLEPHAROPLASTY    . CHOLECYSTECTOMY  1967   Family History  Problem Relation Age of Onset  . Arthritis Mother   . Heart disease Mother   . Arthritis Father   . Aneurysm Father 77       brain  . Thyroid disease Daughter    Allergies as of 03/18/2017      Reactions   Doxycycline    GI upset   Keflex [cephalexin] Hives, Itching      Medication List        Accurate as of 03/18/17  9:17 AM. Always use your most recent med list.          aspirin EC 81 MG tablet Take 81 mg by mouth daily.   Biotin 5000 MCG Tabs Take by mouth.   calcium citrate-vitamin D 315-200 MG-UNIT tablet Commonly known as:  CITRACAL+D Take 1 tablet by mouth 2 (two) times daily.   co-enzyme Q-10 30 MG capsule Take 30 mg by mouth 3 (three) times daily.  Cranberry 500 MG Tabs Take 1 tablet by mouth 2 (two) times daily.   DUREZOL 0.05 % Emul Generic drug:  Difluprednate INSTILL 1 DROP INTO LEFT EYE 3 TIMES A DAY   FLAX SEED OIL PO Take 1,300 mg by mouth 2 (two) times daily.   glucosamine-chondroitin 500-400 MG tablet Take 1 tablet by mouth 3 (three) times daily.   levothyroxine 125 MCG tablet Commonly known as:  SYNTHROID, LEVOTHROID Take 1 tablet (125 mcg total) by mouth daily.   lisinopril 5 MG tablet Commonly known as:  PRINIVIL,ZESTRIL Take 1 tablet (5 mg total) by mouth daily. Needs office visit prior to anymore refills   OMEGA-3 COMPLEX PO Take 360 mg by mouth daily.   polyethylene glycol packet Commonly known as:  MIRALAX / GLYCOLAX Take 17 g by  mouth daily.   PROLENSA 0.07 % Soln Generic drug:  Bromfenac Sodium PUT 1 DROP INTO LEFT EYE AT BEDTIME   traZODone 50 MG tablet Commonly known as:  DESYREL Take 0.5-1 tablets (25-50 mg total) by mouth at bedtime as needed for sleep.   triamterene-hydrochlorothiazide 37.5-25 MG capsule Commonly known as:  DYAZIDE Take 1 each (1 capsule total) by mouth daily.   Vitamin D3 1000 units Caps Take by mouth.       All past medical history, surgical history, allergies, family history, immunizations andmedications were updated in the EMR today and reviewed under the history and medication portions of their EMR.     ROS: Negative, with the exception of above mentioned in HPI   Objective:  BP 113/73 (BP Location: Left Arm, Patient Position: Sitting, Cuff Size: Normal)   Pulse 77   Temp 97.9 F (36.6 C)   Resp 20   Wt 149 lb 8 oz (67.8 kg)   SpO2 96%   BMI 24.88 kg/m  Body mass index is 24.88 kg/m. Gen: Afebrile. No acute distress. Nontoxic in appearance, well developed, well nourished.  HENT: AT. [place]. MMM, no oral lesions.  Eyes:Pupils Equal Round Reactive to light, Extraocular movements intact,  Conjunctiva without redness, discharge or icterus. Neck/lymp/endocrine: Supple,no lymphadenopathy, no thyromegaly, no nodules palpated  CV: RRR no murmur, no edema Chest: CTAB, no wheeze or crackles. Good air movement, normal resp effort.  Abd: Soft. NTND. BS present.  Skin: no rashes, purpura or petechiae.  Neuro:  Normal gait. PERLA. EOMi. Alert. Oriented x3  Psych: Normal affect, dress and demeanor. Normal speech. Normal thought content and judgment.  No exam data present No results found. No results found for this or any previous visit (from the past 24 hour(s)).  Assessment/Plan: Denise Fuller is a 82 y.o. female present for OV for  Essential hypertension/hyperlipidemia - Stable today. Continue lisinopril 5 mg daily and Dyazide. - triamterene-hydrochlorothiazide (DYAZIDE)  37.5-25 MG capsule; Take 1 each (1 capsule total) by mouth daily.  Dispense: 90 capsule; Refill: 1 - CPE due in a month, fasting labs will be collected that time. Patient has had elevated cholesterol with family history of heart disease. She is taking fish oil. - lisinopril (PRINIVIL,ZESTRIL) 5 MG tablet; Take 1 tablet (5 mg total) by mouth daily. Needs office visit prior to anymore refills  Dispense: 30 tablet; Refill: 0 - CBC w/Diff  Hypothyroidism, unspecified type/fatigue - Recheck thyroid levels and CBC today. Refills on levothyroxine will be provided after results received. - TSH, CBC  GAD (generalized anxiety disorder - traZODone (DESYREL) 50 MG tablet; Take 0.5-1 tablets (25-50 mg total) by mouth at bedtime as needed for sleep.  Dispense: 90 tablet; Refill: 1     Reviewed expectations re: course of current medical issues.  Discussed self-management of symptoms.  Outlined signs and symptoms indicating need for more acute intervention.  Patient verbalized understanding and all questions were answered.  Patient received an After-Visit Summary.    No orders of the defined types were placed in this encounter.    Note is dictated utilizing voice recognition software. Although note has been proof read prior to signing, occasional typographical errors still can be missed. If any questions arise, please do not hesitate to call for verification.   electronically signed by:  Howard Pouch, DO  [place]

## 2017-03-19 ENCOUNTER — Telehealth: Payer: Self-pay | Admitting: Family Medicine

## 2017-03-19 DIAGNOSIS — E039 Hypothyroidism, unspecified: Secondary | ICD-10-CM

## 2017-03-19 LAB — CBC WITH DIFFERENTIAL/PLATELET
BASOS PCT: 0.7 %
Basophils Absolute: 52 cells/uL (ref 0–200)
EOS PCT: 2.8 %
Eosinophils Absolute: 207 cells/uL (ref 15–500)
HEMATOCRIT: 41.2 % (ref 35.0–45.0)
Hemoglobin: 13.9 g/dL (ref 11.7–15.5)
LYMPHS ABS: 2435 {cells}/uL (ref 850–3900)
MCH: 29.6 pg (ref 27.0–33.0)
MCHC: 33.7 g/dL (ref 32.0–36.0)
MCV: 87.8 fL (ref 80.0–100.0)
MPV: 10.1 fL (ref 7.5–12.5)
Monocytes Relative: 9.6 %
NEUTROS PCT: 54 %
Neutro Abs: 3996 cells/uL (ref 1500–7800)
Platelets: 304 10*3/uL (ref 140–400)
RBC: 4.69 10*6/uL (ref 3.80–5.10)
RDW: 12.9 % (ref 11.0–15.0)
Total Lymphocyte: 32.9 %
WBC mixed population: 710 cells/uL (ref 200–950)
WBC: 7.4 10*3/uL (ref 3.8–10.8)

## 2017-03-19 MED ORDER — LEVOTHYROXINE SODIUM 112 MCG PO TABS: 112.0000 ug | ORAL_TABLET | Freq: Every day | ORAL | 3 refills | Status: DC

## 2017-03-19 NOTE — Telephone Encounter (Signed)
Spoke with patient reviewed lab results and instructions. Patient verbalized understanding. 

## 2017-03-19 NOTE — Telephone Encounter (Signed)
Called patient back got voice mail left detailed message with information .

## 2017-03-19 NOTE — Telephone Encounter (Signed)
Please call pt: - her thyroid is over replaced. This can cause fatigue just as under replaced thyroid. I have called in the new dose of 112 mcg, levothyroxine.

## 2017-03-19 NOTE — Telephone Encounter (Signed)
Pt called back in to speak with nurse that called. Pt says that she was in a meeting when she received the call. She have questions. Please call pt back at: [phone]

## 2017-04-24 DIAGNOSIS — H353131 Nonexudative age-related macular degeneration, bilateral, early dry stage: Secondary | ICD-10-CM | POA: Diagnosis not present

## 2017-04-24 DIAGNOSIS — H16223 Keratoconjunctivitis sicca, not specified as Sjogren's, bilateral: Secondary | ICD-10-CM | POA: Diagnosis not present

## 2017-04-25 NOTE — Progress Notes (Addendum)
Subjective:   Denise Fuller is a 82 y.o. female who presents for an Initial Medicare Annual Wellness Visit.  Review of Systems    No ROS.  Medicare Wellness Visit. Additional risk factors are reflected in the social history.   Cardiac Risk Factors include: advanced age (>16men, >56 women);dyslipidemia;hypertension;family history of premature cardiovascular disease   Sleep patterns: Sleeps 6- 8 hours. Naps occasionally.  Home Safety/Smoke Alarms: Feels safe in home. Smoke alarms in place.  Living environment; residence and Firearm Safety: Lives with husband in 1 story home with steps at door, uses rail.  Seat Belt Safety/Bike Helmet: Wears seat belt.   Female:   Pap-N/A     Mammo-Does not desire further screening       Dexa scan-Pt reports > 10 years ago, pt reports normal. Declines further testing.         CCS-Pt reports colonoscopy 10 years ago, pt reports normal.      Objective:    Today's Vitals   04/26/17 0818  BP: 130/76  Pulse: 82  Resp: 16  Temp: 97.6 F (36.4 C)  TempSrc: Oral  SpO2: 97%  Weight: 155 lb 6.4 oz (70.5 kg)  Height: 5' 4.5" (1.638 m)   Body mass index is 26.26 kg/m.  Advanced Directives 04/26/2017  Does Patient Have a Medical Advance Directive? Yes  Type of Paramedic of [place];Living will  Copy of [place] in Chart? No - copy requested    Current Medications (verified) Outpatient Encounter Medications as of 04/26/2017  Medication Sig  . Biotin 5000 MCG TABS Take by mouth.  . calcium citrate-vitamin D (CITRACAL+D) 315-200 MG-UNIT tablet Take 1 tablet by mouth 2 (two) times daily.  . cetirizine (ZYRTEC) 10 MG tablet Take 10 mg by mouth daily.  . Cholecalciferol (VITAMIN D3) 1000 units CAPS Take by mouth.  . co-enzyme Q-10 30 MG capsule Take 30 mg by mouth 3 (three) times daily.  . Cranberry 500 MG TABS Take 1 tablet by mouth 2 (two) times daily.  . DHA-EPA-Vitamin E (OMEGA-3 COMPLEX PO) Take 360  mg by mouth daily.  . Flaxseed, Linseed, (FLAX SEED OIL PO) Take 1,300 mg by mouth 2 (two) times daily.  Marland Kitchen glucosamine-chondroitin 500-400 MG tablet Take 1 tablet by mouth 3 (three) times daily.  Marland Kitchen levothyroxine (SYNTHROID, LEVOTHROID) 112 MCG tablet Take 1 tablet (112 mcg total) by mouth daily.  Marland Kitchen lisinopril (PRINIVIL,ZESTRIL) 5 MG tablet Take 1 tablet (5 mg total) by mouth daily. Needs office visit prior to anymore refills  . Polyethyl Glycol-Propyl Glycol (SYSTANE OP) Apply to eye.  . polyethylene glycol (MIRALAX / GLYCOLAX) packet Take 17 g by mouth daily.  . traZODone (DESYREL) 50 MG tablet Take 0.5-1 tablets (25-50 mg total) by mouth at bedtime as needed for sleep.  Marland Kitchen triamterene-hydrochlorothiazide (DYAZIDE) 37.5-25 MG capsule Take 1 each (1 capsule total) by mouth daily.  Marland Kitchen aspirin EC 81 MG tablet Take 81 mg by mouth daily.  . DUREZOL 0.05 % EMUL INSTILL 1 DROP INTO LEFT EYE 3 TIMES A DAY  . PROLENSA 0.07 % SOLN PUT 1 DROP INTO LEFT EYE AT BEDTIME   No facility-administered encounter medications on file as of 04/26/2017.     Allergies (verified) Doxycycline and Keflex [cephalexin]   History: Past Medical History:  Diagnosis Date  . Bartonella infection   . BCC (basal cell carcinoma of skin) 1997   nose  . Chickenpox   . Chronic venous insufficiency   .  Diverticulosis   . Elevated alkaline phosphatase level   . Elevated LDH   . Environmental allergies   . GAD (generalized anxiety disorder)   . Groin abscess    culture negative, doxy resolved abscess  . Hepatitis A   . Hyperlipidemia   . Hypertension   . Hypothyroidism   . Lymphadenopathy, cervical    chronic per pt. (left)  . Macular degeneration   . Meniere disease   . Thyroid disease    Past Surgical History:  Procedure Laterality Date  . ABDOMINAL HYSTERECTOMY  1991  . APPENDECTOMY  1967  . BASAL CELL CARCINOMA EXCISION  1997   nose  . BLEPHAROPLASTY    . CATARACT EXTRACTION W/ INTRAOCULAR LENS IMPLANT  Bilateral Oct and Nov 2018  . CHOLECYSTECTOMY  1967   Family History  Problem Relation Age of Onset  . Arthritis Mother   . Heart disease Mother   . Arthritis Father   . Aneurysm Father 57       brain  . Thyroid disease Daughter    Social History   Socioeconomic History  . Marital status: Married    Spouse name: [name]  . Number of children: 3  . Years of education: Not on file  . Highest education level: Not on file  Occupational History  . Occupation: retired  Scientific laboratory technician  . Financial resource strain: Not on file  . Food insecurity:    Worry: Not on file    Inability: Not on file  . Transportation needs:    Medical: Not on file    Non-medical: Not on file  Tobacco Use  . Smoking status: Never Smoker  . Smokeless tobacco: Never Used  Substance and Sexual Activity  . Alcohol use: No  . Drug use: No  . Sexual activity: Yes    Partners: Male    Comment: married  Lifestyle  . Physical activity:    Days per week: Not on file    Minutes per session: Not on file  . Stress: Not on file  Relationships  . Social connections:    Talks on phone: Not on file    Gets together: Not on file    Attends religious service: Not on file    Active member of club or organization: Not on file    Attends meetings of clubs or organizations: Not on file    Relationship status: Not on file  Other Topics Concern  . Not on file  Social History Narrative   Retired. Married to [place]. They have 3 children [name])   [place] from [place] 05/2014.    Has 2 older dogs.    Retired.    Drinks caffeine, takes herbal remedies and dialy vitamin.   Wears her seatbelt, smoke detector in the home   Wears dentures, no assistive devices for walking - independent.    Feels safe in her relationships.     Tobacco Counseling Counseling given: Not Answered   Activities of Daily Living In your present state of health, do you have any difficulty performing the following activities:  04/26/2017  Hearing? N  Vision? N  Difficulty concentrating or making decisions? N  Walking or climbing stairs? N  Dressing or bathing? N  Doing errands, shopping? N  Preparing Food and eating ? N  Using the Toilet? N  In the past six months, have you accidently leaked urine? N  Do you have problems with loss of bowel control? N  Managing your Medications? N  Managing your Finances? N  Housekeeping or managing your Housekeeping? N  Some recent data might be hidden     Immunizations and Health Maintenance Immunization History  Administered Date(s) Administered  . Influenza, High Dose Seasonal PF 01/10/2016  . Influenza-Unspecified 10/27/2014, 11/08/2016  . Pneumococcal Conjugate-13 10/27/2014   There are no preventive care reminders to display for this patient.  Patient Care Team: Ma Hillock, DO as PCP - General (Family Medicine) Otelia Sergeant, OD as Referring Physician [place] (Dentistry)  Indicate any recent Medical Services you may have received from other than [facility] providers in the past year (date may be approximate).     Assessment:   This is a routine wellness examination for Kiela.  Hearing/Vision screen Hearing Screening Comments: Able to hear conversational tones w/o difficulty. No issues reported.   Vision Screening Comments: Last exam 04/24/2017. [place]. Wears glasses. H/O Cataract surgery.   Dietary issues and exercise activities discussed: Current Exercise Habits: Home exercise routine, Type of exercise: walking, Time (Minutes): 30, Frequency (Times/Week): 3, Weekly Exercise (Minutes/Week): 90, Exercise limited by: None identified   Diet (meal preparation, eat out, water intake, caffeinated beverages, dairy products, fruits and vegetables): Drinks water, coffee and mocha coffee.   Breakfast: boiled eggs, toast, coffee Lunch: sandwich, soup Dinner: protein and vegetables.       Goals    . Patient Stated     Maintain current health  by staying active.       Depression Screen PHQ 2/9 Scores 04/26/2017 04/23/2016 01/10/2016  PHQ - 2 Score 0 0 0    Fall Risk Fall Risk  04/26/2017 04/23/2016 01/10/2016  Falls in the past year? Yes No No  Comment "fell", landed on furniture - -  Number falls in past yr: 1 - -  Injury with Fall? No - -  Risk for fall due to : Impaired balance/gait - -  Follow up Falls prevention discussed - -     Cognitive Function:       Ad8 score reviewed for issues:  Issues making decisions: no  Less interest in hobbies / activities: no  Repeats questions, stories (family complaining): no  Trouble using ordinary gadgets (microwave, computer, phone): no  Forgets the month or year: no  Mismanaging finances: no  Remembering appts:no  Daily problems with thinking and/or memory: no Ad8 score is=0     Screening Tests Health Maintenance  Topic Date Due  . TETANUS/TDAP  02/06/2020  . INFLUENZA VACCINE  Completed  . DEXA SCAN  Completed  . PNA vac Low Risk Adult  Completed        Plan:    Continue doing brain stimulating activities (puzzles, reading, adult coloring books, staying active) to keep memory sharp.   Bring a copy of your living will and/or healthcare power of attorney to your next office visit.  I have personally reviewed and noted the following in the patient's chart:   . Medical and social history . Use of alcohol, tobacco or illicit drugs  . Current medications and supplements . Functional ability and status . Nutritional status . Physical activity . Advanced directives . List of other physicians . Hospitalizations, surgeries, and ER visits in previous 12 months . Vitals . Screenings to include cognitive, depression, and falls . Referrals and appointments  In addition, I have reviewed and discussed with patient certain preventive protocols, quality metrics, and best practice recommendations. A written personalized care plan for preventive services as well  as general preventive health  recommendations were provided to patient.     Gerilyn Nestle, RN   04/26/2017     Medical screening examination/treatment/procedure(s) were performed by non-physician practitioner and as supervising physician I was immediately available for consultation/collaboration.  I agree with above assessment and plan.  Electronically Signed by: Howard Pouch, DO [facility]

## 2017-04-26 ENCOUNTER — Other Ambulatory Visit: Payer: Self-pay

## 2017-04-26 ENCOUNTER — Encounter: Payer: Self-pay | Admitting: Family Medicine

## 2017-04-26 ENCOUNTER — Ambulatory Visit (INDEPENDENT_AMBULATORY_CARE_PROVIDER_SITE_OTHER): Payer: Medicare HMO

## 2017-04-26 ENCOUNTER — Ambulatory Visit (INDEPENDENT_AMBULATORY_CARE_PROVIDER_SITE_OTHER): Payer: Medicare HMO | Admitting: Family Medicine

## 2017-04-26 VITALS — BP 130/76 | HR 82 | Temp 97.6°F | Resp 16 | Ht 64.5 in | Wt 155.4 lb

## 2017-04-26 VITALS — BP 130/76 | HR 82 | Temp 97.6°F | Resp 16 | Ht 64.5 in | Wt 155.2 lb

## 2017-04-26 DIAGNOSIS — E039 Hypothyroidism, unspecified: Secondary | ICD-10-CM

## 2017-04-26 DIAGNOSIS — E785 Hyperlipidemia, unspecified: Secondary | ICD-10-CM

## 2017-04-26 DIAGNOSIS — I10 Essential (primary) hypertension: Secondary | ICD-10-CM

## 2017-04-26 DIAGNOSIS — Z0000 Encounter for general adult medical examination without abnormal findings: Secondary | ICD-10-CM

## 2017-04-26 DIAGNOSIS — Z131 Encounter for screening for diabetes mellitus: Secondary | ICD-10-CM | POA: Diagnosis not present

## 2017-04-26 NOTE — Patient Instructions (Addendum)
Continue doing brain stimulating activities (puzzles, reading, adult coloring books, staying active) to keep memory sharp.   Bring a copy of your living will and/or healthcare power of attorney to your next office visit.   Health Maintenance, Female Adopting a healthy lifestyle and getting preventive care can go a long way to promote health and wellness. Talk with your health care provider about what schedule of regular examinations is right for you. This is a good chance for you to check in with your provider about disease prevention and staying healthy. In between checkups, there are plenty of things you can do on your own. Experts have done a lot of research about which lifestyle changes and preventive measures are most likely to keep you healthy. Ask your health care provider for more information. Weight and diet Eat a healthy diet  Be sure to include plenty of vegetables, fruits, low-fat dairy products, and lean protein.  Do not eat a lot of foods high in solid fats, added sugars, or salt.  Get regular exercise. This is one of the most important things you can do for your health. ? Most adults should exercise for at least 150 minutes each week. The exercise should increase your heart rate and make you sweat (moderate-intensity exercise). ? Most adults should also do strengthening exercises at least twice a week. This is in addition to the moderate-intensity exercise.  Maintain a healthy weight  Body mass index (BMI) is a measurement that can be used to identify possible weight problems. It estimates body fat based on height and weight. Your health care provider can help determine your BMI and help you achieve or maintain a healthy weight.  For females 20 years of age and older: ? A BMI below 18.5 is considered underweight. ? A BMI of 18.5 to 24.9 is normal. ? A BMI of 25 to 29.9 is considered overweight. ? A BMI of 30 and above is considered obese.  Watch levels of cholesterol and  blood lipids  You should start having your blood tested for lipids and cholesterol at 80 years of age, then have this test every 5 years.  You may need to have your cholesterol levels checked more often if: ? Your lipid or cholesterol levels are high. ? You are older than 80 years of age. ? You are at high risk for heart disease.  Cancer screening Lung Cancer  Lung cancer screening is recommended for adults 55-80 years old who are at high risk for lung cancer because of a history of smoking.  A yearly low-dose CT scan of the lungs is recommended for people who: ? Currently smoke. ? Have quit within the past 15 years. ? Have at least a 30-pack-year history of smoking. A pack year is smoking an average of one pack of cigarettes a day for 1 year.  Yearly screening should continue until it has been 15 years since you quit.  Yearly screening should stop if you develop a health problem that would prevent you from having lung cancer treatment.  Breast Cancer  Practice breast self-awareness. This means understanding how your breasts normally appear and feel.  It also means doing regular breast self-exams. Let your health care provider know about any changes, no matter how small.  If you are in your 20s or 30s, you should have a clinical breast exam (CBE) by a health care provider every 1-3 years as part of a regular health exam.  If you are 40 or older, have a CBE   year. Also consider having a breast X-ray (mammogram) every year.  If you have a family history of breast cancer, talk to your health care provider about genetic screening.  If you are at high risk for breast cancer, talk to your health care provider about having an MRI and a mammogram every year.  Breast cancer gene (BRCA) assessment is recommended for women who have family members with BRCA-related cancers. BRCA-related cancers include: ? Breast. ? Ovarian. ? Tubal. ? Peritoneal cancers.  Results of the assessment  will determine the need for genetic counseling and BRCA1 and BRCA2 testing.  Cervical Cancer Your health care provider may recommend that you be screened regularly for cancer of the pelvic organs (ovaries, uterus, and vagina). This screening involves a pelvic examination, including checking for microscopic changes to the surface of your cervix (Pap test). You may be encouraged to have this screening done every 3 years, beginning at age 67.  For women ages 29-65, health care providers may recommend pelvic exams and Pap testing every 3 years, or they may recommend the Pap and pelvic exam, combined with testing for human papilloma virus (HPV), every 5 years. Some types of HPV increase your risk of cervical cancer. Testing for HPV may also be done on women of any age with unclear Pap test results.  Other health care providers may not recommend any screening for nonpregnant women who are considered low risk for pelvic cancer and who do not have symptoms. Ask your health care provider if a screening pelvic exam is right for you.  If you have had past treatment for cervical cancer or a condition that could lead to cancer, you need Pap tests and screening for cancer for at least 20 years after your treatment. If Pap tests have been discontinued, your risk factors (such as having a new sexual partner) need to be reassessed to determine if screening should resume. Some women have medical problems that increase the chance of getting cervical cancer. In these cases, your health care provider may recommend more frequent screening and Pap tests.  Colorectal Cancer  This type of cancer can be detected and often prevented.  Routine colorectal cancer screening usually begins at 80 years of age and continues through 80 years of age.  Your health care provider may recommend screening at an earlier age if you have risk factors for colon cancer.  Your health care provider may also recommend using home test kits to  check for hidden blood in the stool.  A small camera at the end of a tube can be used to examine your colon directly (sigmoidoscopy or colonoscopy). This is done to check for the earliest forms of colorectal cancer.  Routine screening usually begins at age 45.  Direct examination of the colon should be repeated every 5-10 years through 80 years of age. However, you may need to be screened more often if early forms of precancerous polyps or small growths are found.  Skin Cancer  Check your skin from head to toe regularly.  Tell your health care provider about any new moles or changes in moles, especially if there is a change in a mole's shape or color.  Also tell your health care provider if you have a mole that is larger than the size of a pencil eraser.  Always use sunscreen. Apply sunscreen liberally and repeatedly throughout the day.  Protect yourself by wearing long sleeves, pants, a wide-brimmed hat, and sunglasses whenever you are outside.  Heart disease, diabetes, and  high blood pressure  High blood pressure causes heart disease and increases the risk of stroke. High blood pressure is more likely to develop in: ? People who have blood pressure in the high end of the normal range (130-139/85-89 mm Hg). ? People who are overweight or obese. ? People who are African American.  If you are 18-39 years of age, have your blood pressure checked every 3-5 years. If you are 40 years of age or older, have your blood pressure checked every year. You should have your blood pressure measured twice-once when you are at a hospital or clinic, and once when you are not at a hospital or clinic. Record the average of the two measurements. To check your blood pressure when you are not at a hospital or clinic, you can use: ? An automated blood pressure machine at a pharmacy. ? A home blood pressure monitor.  If you are between 55 years and 79 years old, ask your health care provider if you should  take aspirin to prevent strokes.  Have regular diabetes screenings. This involves taking a blood sample to check your fasting blood sugar level. ? If you are at a normal weight and have a low risk for diabetes, have this test once every three years after 80 years of age. ? If you are overweight and have a high risk for diabetes, consider being tested at a younger age or more often. Preventing infection Hepatitis B  If you have a higher risk for hepatitis B, you should be screened for this virus. You are considered at high risk for hepatitis B if: ? You were born in a country where hepatitis B is common. Ask your health care provider which countries are considered high risk. ? Your parents were born in a high-risk country, and you have not been immunized against hepatitis B (hepatitis B vaccine). ? You have HIV or AIDS. ? You use needles to inject street drugs. ? You live with someone who has hepatitis B. ? You have had sex with someone who has hepatitis B. ? You get hemodialysis treatment. ? You take certain medicines for conditions, including cancer, organ transplantation, and autoimmune conditions.  Hepatitis C  Blood testing is recommended for: ? Everyone born from 1945 through 1965. ? Anyone with known risk factors for hepatitis C.  Sexually transmitted infections (STIs)  You should be screened for sexually transmitted infections (STIs) including gonorrhea and chlamydia if: ? You are sexually active and are younger than 80 years of age. ? You are older than 80 years of age and your health care provider tells you that you are at risk for this type of infection. ? Your sexual activity has changed since you were last screened and you are at an increased risk for chlamydia or gonorrhea. Ask your health care provider if you are at risk.  If you do not have HIV, but are at risk, it may be recommended that you take a prescription medicine daily to prevent HIV infection. This is called  pre-exposure prophylaxis (PrEP). You are considered at risk if: ? You are sexually active and do not regularly use condoms or know the HIV status of your partner(s). ? You take drugs by injection. ? You are sexually active with a partner who has HIV.  Talk with your health care provider about whether you are at high risk of being infected with HIV. If you choose to begin PrEP, you should first be tested for HIV. You should then be   tested every 3 months for as long as you are taking PrEP. Pregnancy  If you are premenopausal and you may become pregnant, ask your health care provider about preconception counseling.  If you may become pregnant, take 400 to 800 micrograms (mcg) of folic acid every day.  If you want to prevent pregnancy, talk to your health care provider about birth control (contraception). Osteoporosis and menopause  Osteoporosis is a disease in which the bones lose minerals and strength with aging. This can result in serious bone fractures. Your risk for osteoporosis can be identified using a bone density scan.  If you are 65 years of age or older, or if you are at risk for osteoporosis and fractures, ask your health care provider if you should be screened.  Ask your health care provider whether you should take a calcium or vitamin D supplement to lower your risk for osteoporosis.  Menopause may have certain physical symptoms and risks.  Hormone replacement therapy may reduce some of these symptoms and risks. Talk to your health care provider about whether hormone replacement therapy is right for you. Follow these instructions at home:  Schedule regular health, dental, and eye exams.  Stay current with your immunizations.  Do not use any tobacco products including cigarettes, chewing tobacco, or electronic cigarettes.  If you are pregnant, do not drink alcohol.  If you are breastfeeding, limit how much and how often you drink alcohol.  Limit alcohol intake to no more  than 1 drink per day for nonpregnant women. One drink equals 12 ounces of beer, 5 ounces of wine, or 1 ounces of hard liquor.  Do not use street drugs.  Do not share needles.  Ask your health care provider for help if you need support or information about quitting drugs.  Tell your health care provider if you often feel depressed.  Tell your health care provider if you have ever been abused or do not feel safe at home. This information is not intended to replace advice given to you by your health care provider. Make sure you discuss any questions you have with your health care provider. Document Released: 08/07/2010 Document Revised: 06/30/2015 Document Reviewed: 10/26/2014 [employer]vier Interactive Patient Education  2018 [employer]  

## 2017-04-26 NOTE — Progress Notes (Signed)
Patient ID: Denise Fuller, female  DOB: 19-Jul-1935, 82 y.o.   MRN: [id number] Patient Care Team    Relationship Specialty Notifications Start End  Ma Hillock, DO PCP - General Family Medicine  01/10/16   Otelia Sergeant, OD Referring Physician   01/10/16    Comment: ophth- macular degeneration  [facility]  04/26/17     Chief Complaint  Patient presents with  . Annual Exam    Subjective:  Denise Fuller is a 82 y.o.  Female  present for CPE. All past medical history, surgical history, allergies, family history, immunizations, medications and social history were updated in the electronic medical record today. All recent labs, ED visits and hospitalizations within the last year were reviewed.  Health maintenance:  Colonoscopy: > 75, N/A Mammogram: pt doe snot desire further testing.  Cervical cancer screening: > 65 n/a Immunizations: tdap UTD 2012, Influenza UTD 2018 (encouraged yearly), PNA series completed, Shingrix declined Infectious disease screening: n/a DEXA: pt declined Assistive device: none Oxygen JJH:ERDE Patient has a Dental home. Hospitalizations/ED visits: none   Depression screen [facility] 2/9 04/26/2017 04/23/2016 01/10/2016  Decreased Interest 0 0 0  Down, Depressed, Hopeless 0 0 0  PHQ - 2 Score 0 0 0   No flowsheet data found.  Immunization History  Administered Date(s) Administered  . Influenza, High Dose Seasonal PF 01/10/2016  . Influenza-Unspecified 10/27/2014, 11/08/2016  . Pneumococcal Conjugate-13 10/27/2014   Past Medical History:  Diagnosis Date  . Bartonella infection   . BCC (basal cell carcinoma of skin) 1997   nose  . Chickenpox   . Chronic venous insufficiency   . Diverticulosis   . Elevated alkaline phosphatase level   . Elevated LDH   . Environmental allergies   . GAD (generalized anxiety disorder)   . Groin abscess    culture negative, doxy resolved abscess  . Hepatitis A   . Hyperlipidemia   . Hypertension     . Hypothyroidism   . Lymphadenopathy, cervical    chronic per pt. (left)  . Macular degeneration   . Meniere disease   . Thyroid disease    Allergies  Allergen Reactions  . Doxycycline     GI upset  . Keflex [Cephalexin] Hives and Itching   Past Surgical History:  Procedure Laterality Date  . ABDOMINAL HYSTERECTOMY  1991  . APPENDECTOMY  1967  . BASAL CELL CARCINOMA EXCISION  1997   nose  . BLEPHAROPLASTY    . CATARACT EXTRACTION W/ INTRAOCULAR LENS IMPLANT Bilateral Oct and Nov 2018  . CHOLECYSTECTOMY  1967   Family History  Problem Relation Age of Onset  . Arthritis Mother   . Heart disease Mother   . Arthritis Father   . Aneurysm Father 52       brain  . Thyroid disease Daughter    Social History   Socioeconomic History  . Marital status: Married    Spouse name: Denise Fuller  . Number of children: 3  . Years of education: Not on file  . Highest education level: Not on file  Occupational History  . Occupation: retired  Scientific laboratory technician  . Financial resource strain: Not on file  . Food insecurity:    Worry: Not on file    Inability: Not on file  . Transportation needs:    Medical: Not on file    Non-medical: Not on file  Tobacco Use  . Smoking status: Never Smoker  . Smokeless tobacco: Never Used  Substance and Sexual Activity  . Alcohol use: No  . Drug use: No  . Sexual activity: Yes    Partners: Male    Comment: married  Lifestyle  . Physical activity:    Days per week: Not on file    Minutes per session: Not on file  . Stress: Not on file  Relationships  . Social connections:    Talks on phone: Not on file    Gets together: Not on file    Attends religious service: Not on file    Active member of club or organization: Not on file    Attends meetings of clubs or organizations: Not on file    Relationship status: Not on file  . Intimate partner violence:    Fear of current or ex partner: Not on file    Emotionally abused: Not on file    Physically  abused: Not on file    Forced sexual activity: Not on file  Other Topics Concern  . Not on file  Social History Narrative   Retired. Married to [place]. They have 3 children [name])   [place] from [place] 05/2014.    Has 2 older dogs.    Retired.    Drinks caffeine, takes herbal remedies and dialy vitamin.   Wears her seatbelt, smoke detector in the home   Wears dentures, no assistive devices for walking - independent.    Feels safe in her relationships.    Allergies as of 04/26/2017      Reactions   Doxycycline    GI upset   Keflex [cephalexin] Hives, Itching      Medication List        Accurate as of 04/26/17  9:21 AM. Always use your most recent med list.          aspirin EC 81 MG tablet Take 81 mg by mouth daily.   Biotin 5000 MCG Tabs Take by mouth.   calcium citrate-vitamin D 315-200 MG-UNIT tablet Commonly known as:  CITRACAL+D Take 1 tablet by mouth 2 (two) times daily.   cetirizine 10 MG tablet Commonly known as:  ZYRTEC Take 10 mg by mouth daily.   co-enzyme Q-10 30 MG capsule Take 30 mg by mouth 3 (three) times daily.   Cranberry 500 MG Tabs Take 1 tablet by mouth 2 (two) times daily.   DUREZOL 0.05 % Emul Generic drug:  Difluprednate INSTILL 1 DROP INTO LEFT EYE 3 TIMES A DAY   FLAX SEED OIL PO Take 1,300 mg by mouth 2 (two) times daily.   glucosamine-chondroitin 500-400 MG tablet Take 1 tablet by mouth 3 (three) times daily.   levothyroxine 112 MCG tablet Commonly known as:  SYNTHROID, LEVOTHROID Take 1 tablet (112 mcg total) by mouth daily.   lisinopril 5 MG tablet Commonly known as:  PRINIVIL,ZESTRIL Take 1 tablet (5 mg total) by mouth daily. Needs office visit prior to anymore refills   OMEGA-3 COMPLEX PO Take 360 mg by mouth daily.   polyethylene glycol packet Commonly known as:  MIRALAX / GLYCOLAX Take 17 g by mouth daily.   PROLENSA 0.07 % Soln Generic drug:  Bromfenac Sodium PUT 1 DROP INTO LEFT EYE AT BEDTIME     SYSTANE OP Apply to eye.   traZODone 50 MG tablet Commonly known as:  DESYREL Take 0.5-1 tablets (25-50 mg total) by mouth at bedtime as needed for sleep.   triamterene-hydrochlorothiazide 37.5-25 MG capsule Commonly known as:  DYAZIDE Take 1 each (1 capsule  total) by mouth daily.   Vitamin D3 1000 units Caps Take by mouth.       All past medical history, surgical history, allergies, family history, immunizations andmedications were updated in the EMR today and reviewed under the history and medication portions of their EMR.     Recent Results (from the past 2160 hour(s))  TSH     Status: Abnormal   Collection Time: 03/18/17  9:40 AM  Result Value Ref Range   TSH 0.24 (L) 0.35 - 4.50 uIU/mL  CBC with Differential/Platelet     Status: None   Collection Time: 03/18/17 12:01 PM  Result Value Ref Range   WBC 7.4 3.8 - 10.8 Thousand/uL   RBC 4.69 3.80 - 5.10 Million/uL   Hemoglobin 13.9 11.7 - 15.5 g/dL   HCT 41.2 35.0 - 45.0 %   MCV 87.8 80.0 - 100.0 fL   MCH 29.6 27.0 - 33.0 pg   MCHC 33.7 32.0 - 36.0 g/dL   RDW 12.9 11.0 - 15.0 %   Platelets 304 140 - 400 Thousand/uL   MPV 10.1 7.5 - 12.5 fL   Neutro Abs 3,996 1,500 - 7,800 cells/uL   Lymphs Abs 2,435 850 - 3,900 cells/uL   WBC mixed population 710 200 - 950 cells/uL   Eosinophils Absolute 207 15 - 500 cells/uL   Basophils Absolute 52 0 - 200 cells/uL   Neutrophils Relative % 54 %   Total Lymphocyte 32.9 %   Monocytes Relative 9.6 %   Eosinophils Relative 2.8 %   Basophils Relative 0.7 %    Patient was never admitted.   ROS: 14 pt review of systems performed and negative (unless mentioned in an HPI)  Objective: BP 130/76 (BP Location: Left Arm, Patient Position: Sitting, Cuff Size: Normal)   Pulse 82   Temp 97.6 F (36.4 C)   Resp 16   Ht 5' 4.5" (1.638 m)   Wt 155 lb 4 oz (70.4 kg)   SpO2 97%   BMI 26.24 kg/m  Gen: Afebrile. No acute distress. Nontoxic in appearance, well-developed, well-nourished,   Very pleasant caucasian female.  HENT: AT. [place]. Bilateral TM visualized and normal in appearance, normal external auditory canal. MMM, no oral lesions, adequate dentition. Bilateral nares within normal limits. Throat without erythema, ulcerations or exudates. no Cough on exam, no hoarseness on exam. Eyes:Pupils Equal Round Reactive to light, Extraocular movements intact,  Conjunctiva without redness, discharge or icterus. Neck/lymp/endocrine: Supple,no lymphadenopathy, no thyromegaly CV: RRR no murmur, no edema, +2/4 P posterior tibialis pulses. no carotid bruits. No JVD. Chest: CTAB, no wheeze, rhonchi or crackles. normal Respiratory effort. good Air movement. Abd: Soft. flat. NTND. BS present. no Masses palpated. No hepatosplenomegaly. No rebound tenderness or guarding. Skin: no rashes, purpura or petechiae. Warm and well-perfused. Skin intact. Neuro/Msk:  Normal gait. PERLA. EOMi. Alert. Oriented x3.  Cranial nerves II through XII intact. Muscle strength 5/5 upper/lower extremity. DTRs equal bilaterally. Psych: Normal affect, dress and demeanor. Normal speech. Normal thought content and judgment.   No exam data present  Assessment/plan: NICHOLL ONSTOTT is a 82 y.o. female present for CPE. Essential hypertension - stable.  - CBC w/Diff; Future - Comp Met (CMET); Future - Lipid panel; Future - follow per routine every 6 mos Hypothyroidism, unspecified type - TSH; Future Hyperlipidemia, unspecified hyperlipidemia type - CBC w/Diff; Future - Comp Met (CMET); Future - Lipid panel; Future - TSH; Future Screening for diabetes mellitus - HgB A1c; Future Encounter for preventive health examination Patient  was encouraged to exercise greater than 150 minutes a week. Patient was encouraged to choose a diet filled with fresh fruits and vegetables, and lean meats. AVS provided to patient today for education/recommendation on gender specific health and safety maintenance. Colonoscopy: > 75,  N/A Mammogram: pt doe snot desire further testing.  Cervical cancer screening: > 65 n/a Immunizations: tdap UTD 2012, Influenza UTD 2018 (encouraged yearly), PNA series completed, Shingrix declined Infectious disease screening: n/a  * medicare wellness prior to provider visit with health coach.   Return in about 1 year (around 04/27/2018) for CPE.  Electronically signed by: Howard Pouch, DO [place]

## 2017-04-26 NOTE — Patient Instructions (Signed)
Every 6 months chronic medical conditions.  Yearly CPE and medicare wellness.    Health Maintenance, Female Adopting a healthy lifestyle and getting preventive care can go a long way to promote health and wellness. Talk with your health care provider about what schedule of regular examinations is right for you. This is a good chance for you to check in with your provider about disease prevention and staying healthy. In between checkups, there are plenty of things you can do on your own. Experts have done a lot of research about which lifestyle changes and preventive measures are most likely to keep you healthy. Ask your health care provider for more information. Weight and diet Eat a healthy diet  Be sure to include plenty of vegetables, fruits, low-fat dairy products, and lean protein.  Do not eat a lot of foods high in solid fats, added sugars, or salt.  Get regular exercise. This is one of the most important things you can do for your health. ? Most adults should exercise for at least 150 minutes each week. The exercise should increase your heart rate and make you sweat (moderate-intensity exercise). ? Most adults should also do strengthening exercises at least twice a week. This is in addition to the moderate-intensity exercise.  Maintain a healthy weight  Body mass index (BMI) is a measurement that can be used to identify possible weight problems. It estimates body fat based on height and weight. Your health care provider can help determine your BMI and help you achieve or maintain a healthy weight.  For females 73 years of age and older: ? A BMI below 18.5 is considered underweight. ? A BMI of 18.5 to 24.9 is normal. ? A BMI of 25 to 29.9 is considered overweight. ? A BMI of 30 and above is considered obese.  Watch levels of cholesterol and blood lipids  You should start having your blood tested for lipids and cholesterol at 80 years of age, then have this test every 5  years.  You may need to have your cholesterol levels checked more often if: ? Your lipid or cholesterol levels are high. ? You are older than 80 years of age. ? You are at high risk for heart disease.  Cancer screening Lung Cancer  Lung cancer screening is recommended for adults 55-33 years old who are at high risk for lung cancer because of a history of smoking.  A yearly low-dose CT scan of the lungs is recommended for people who: ? Currently smoke. ? Have quit within the past 15 years. ? Have at least a 30-pack-year history of smoking. A pack year is smoking an average of one pack of cigarettes a day for 1 year.  Yearly screening should continue until it has been 15 years since you quit.  Yearly screening should stop if you develop a health problem that would prevent you from having lung cancer treatment.  Breast Cancer  Practice breast self-awareness. This means understanding how your breasts normally appear and feel.  It also means doing regular breast self-exams. Let your health care provider know about any changes, no matter how small.  If you are in your 20s or 30s, you should have a clinical breast exam (CBE) by a health care provider every 1-3 years as part of a regular health exam.  If you are 14 or older, have a CBE every year. Also consider having a breast X-ray (mammogram) every year.  If you have a family history of breast cancer, talk  to your health care provider about genetic screening.  If you are at high risk for breast cancer, talk to your health care provider about having an MRI and a mammogram every year.  Breast cancer gene (BRCA) assessment is recommended for women who have family members with BRCA-related cancers. BRCA-related cancers include: ? Breast. ? Ovarian. ? Tubal. ? Peritoneal cancers.  Results of the assessment will determine the need for genetic counseling and BRCA1 and BRCA2 testing.  Cervical Cancer Your health care provider may  recommend that you be screened regularly for cancer of the pelvic organs (ovaries, uterus, and vagina). This screening involves a pelvic examination, including checking for microscopic changes to the surface of your cervix (Pap test). You may be encouraged to have this screening done every 3 years, beginning at age 57.  For women ages 33-65, health care providers may recommend pelvic exams and Pap testing every 3 years, or they may recommend the Pap and pelvic exam, combined with testing for human papilloma virus (HPV), every 5 years. Some types of HPV increase your risk of cervical cancer. Testing for HPV may also be done on women of any age with unclear Pap test results.  Other health care providers may not recommend any screening for nonpregnant women who are considered low risk for pelvic cancer and who do not have symptoms. Ask your health care provider if a screening pelvic exam is right for you.  If you have had past treatment for cervical cancer or a condition that could lead to cancer, you need Pap tests and screening for cancer for at least 20 years after your treatment. If Pap tests have been discontinued, your risk factors (such as having a new sexual partner) need to be reassessed to determine if screening should resume. Some women have medical problems that increase the chance of getting cervical cancer. In these cases, your health care provider may recommend more frequent screening and Pap tests.  Colorectal Cancer  This type of cancer can be detected and often prevented.  Routine colorectal cancer screening usually begins at 80 years of age and continues through 80 years of age.  Your health care provider may recommend screening at an earlier age if you have risk factors for colon cancer.  Your health care provider may also recommend using home test kits to check for hidden blood in the stool.  A small camera at the end of a tube can be used to examine your colon directly  (sigmoidoscopy or colonoscopy). This is done to check for the earliest forms of colorectal cancer.  Routine screening usually begins at age 32.  Direct examination of the colon should be repeated every 5-10 years through 80 years of age. However, you may need to be screened more often if early forms of precancerous polyps or small growths are found.  Skin Cancer  Check your skin from head to toe regularly.  Tell your health care provider about any new moles or changes in moles, especially if there is a change in a mole's shape or color.  Also tell your health care provider if you have a mole that is larger than the size of a pencil eraser.  Always use sunscreen. Apply sunscreen liberally and repeatedly throughout the day.  Protect yourself by wearing long sleeves, pants, a wide-brimmed hat, and sunglasses whenever you are outside.  Heart disease, diabetes, and high blood pressure  High blood pressure causes heart disease and increases the risk of stroke. High blood pressure is more  likely to develop in: ? People who have blood pressure in the high end of the normal range (130-139/85-89 mm Hg). ? People who are overweight or obese. ? People who are African American.  If you are 20-26 years of age, have your blood pressure checked every 3-5 years. If you are 50 years of age or older, have your blood pressure checked every year. You should have your blood pressure measured twice-once when you are at a hospital or clinic, and once when you are not at a hospital or clinic. Record the average of the two measurements. To check your blood pressure when you are not at a hospital or clinic, you can use: ? An automated blood pressure machine at a pharmacy. ? A home blood pressure monitor.  If you are between 47 years and 46 years old, ask your health care provider if you should take aspirin to prevent strokes.  Have regular diabetes screenings. This involves taking a blood sample to check your  fasting blood sugar level. ? If you are at a normal weight and have a low risk for diabetes, have this test once every three years after 80 years of age. ? If you are overweight and have a high risk for diabetes, consider being tested at a younger age or more often. Preventing infection Hepatitis B  If you have a higher risk for hepatitis B, you should be screened for this virus. You are considered at high risk for hepatitis B if: ? You were born in a country where hepatitis B is common. Ask your health care provider which countries are considered high risk. ? Your parents were born in a high-risk country, and you have not been immunized against hepatitis B (hepatitis B vaccine). ? You have HIV or AIDS. ? You use needles to inject street drugs. ? You live with someone who has hepatitis B. ? You have had sex with someone who has hepatitis B. ? You get hemodialysis treatment. ? You take certain medicines for conditions, including cancer, organ transplantation, and autoimmune conditions.  Hepatitis C  Blood testing is recommended for: ? Everyone born from [birth year] through 1963. ? Anyone with known risk factors for hepatitis C.  Sexually transmitted infections (STIs)  You should be screened for sexually transmitted infections (STIs) including gonorrhea and chlamydia if: ? You are sexually active and are younger than 80 years of age. ? You are older than 80 years of age and your health care provider tells you that you are at risk for this type of infection. ? Your sexual activity has changed since you were last screened and you are at an increased risk for chlamydia or gonorrhea. Ask your health care provider if you are at risk.  If you do not have HIV, but are at risk, it may be recommended that you take a prescription medicine daily to prevent HIV infection. This is called pre-exposure prophylaxis (PrEP). You are considered at risk if: ? You are sexually active and do not regularly use condoms  or know the HIV status of your partner(s). ? You take drugs by injection. ? You are sexually active with a partner who has HIV.  Talk with your health care provider about whether you are at high risk of being infected with HIV. If you choose to begin PrEP, you should first be tested for HIV. You should then be tested every 3 months for as long as you are taking PrEP. Pregnancy  If you are premenopausal and you may  become pregnant, ask your health care provider about preconception counseling.  If you may become pregnant, take 400 to 800 micrograms (mcg) of folic acid every day.  If you want to prevent pregnancy, talk to your health care provider about birth control (contraception). Osteoporosis and menopause  Osteoporosis is a disease in which the bones lose minerals and strength with aging. This can result in serious bone fractures. Your risk for osteoporosis can be identified using a bone density scan.  If you are 85 years of age or older, or if you are at risk for osteoporosis and fractures, ask your health care provider if you should be screened.  Ask your health care provider whether you should take a calcium or vitamin D supplement to lower your risk for osteoporosis.  Menopause may have certain physical symptoms and risks.  Hormone replacement therapy may reduce some of these symptoms and risks. Talk to your health care provider about whether hormone replacement therapy is right for you. Follow these instructions at home:  Schedule regular health, dental, and eye exams.  Stay current with your immunizations.  Do not use any tobacco products including cigarettes, chewing tobacco, or electronic cigarettes.  If you are pregnant, do not drink alcohol.  If you are breastfeeding, limit how much and how often you drink alcohol.  Limit alcohol intake to no more than 1 drink per day for nonpregnant women. One drink equals 12 ounces of beer, 5 ounces of wine, or 1 ounces of hard  liquor.  Do not use street drugs.  Do not share needles.  Ask your health care provider for help if you need support or information about quitting drugs.  Tell your health care provider if you often feel depressed.  Tell your health care provider if you have ever been abused or do not feel safe at home. This information is not intended to replace advice given to you by your health care provider. Make sure you discuss any questions you have with your health care provider. Document Released: 08/07/2010 Document Revised: 06/30/2015 Document Reviewed: 10/26/2014 Elsevier Interactive Patient Education  [employer].

## 2017-04-29 ENCOUNTER — Other Ambulatory Visit (INDEPENDENT_AMBULATORY_CARE_PROVIDER_SITE_OTHER): Payer: Medicare HMO

## 2017-04-29 DIAGNOSIS — E785 Hyperlipidemia, unspecified: Secondary | ICD-10-CM | POA: Diagnosis not present

## 2017-04-29 DIAGNOSIS — I10 Essential (primary) hypertension: Secondary | ICD-10-CM

## 2017-04-29 DIAGNOSIS — Z131 Encounter for screening for diabetes mellitus: Secondary | ICD-10-CM | POA: Diagnosis not present

## 2017-04-29 DIAGNOSIS — E039 Hypothyroidism, unspecified: Secondary | ICD-10-CM

## 2017-04-29 LAB — COMPREHENSIVE METABOLIC PANEL
ALBUMIN: 3.7 g/dL (ref 3.5–5.2)
ALK PHOS: 155 U/L — AB (ref 39–117)
ALT: 19 U/L (ref 0–35)
AST: 19 U/L (ref 0–37)
BILIRUBIN TOTAL: 0.4 mg/dL (ref 0.2–1.2)
BUN: 12 mg/dL (ref 6–23)
CALCIUM: 9.3 mg/dL (ref 8.4–10.5)
CO2: 31 mEq/L (ref 19–32)
CREATININE: 0.6 mg/dL (ref 0.40–1.20)
Chloride: 102 mEq/L (ref 96–112)
GFR: 102.4 mL/min (ref >60.00)
Glucose, Bld: 88 mg/dL (ref 70–99)
Potassium: 3.9 mEq/L (ref 3.5–5.1)
SODIUM: 140 meq/L (ref 135–145)
TOTAL PROTEIN: 6.6 g/dL (ref 6.0–8.3)

## 2017-04-29 LAB — LIPID PANEL
CHOLESTEROL: 217 mg/dL — AB (ref 0–200)
HDL: 74 mg/dL (ref >39.00)
LDL CALC: 126 mg/dL — AB (ref 0–99)
NonHDL: 142.89
TRIGLYCERIDES: 85 mg/dL (ref 0.0–149.0)
Total CHOL/HDL Ratio: 3
VLDL: 17 mg/dL (ref 0.0–40.0)

## 2017-04-29 LAB — TSH: TSH: 7.41 u[IU]/mL — ABNORMAL HIGH (ref 0.35–4.50)

## 2017-04-29 LAB — HEMOGLOBIN A1C: HEMOGLOBIN A1C: 5.3 % (ref 4.6–6.5)

## 2017-04-30 ENCOUNTER — Telehealth: Payer: Self-pay | Admitting: Family Medicine

## 2017-04-30 DIAGNOSIS — E039 Hypothyroidism, unspecified: Secondary | ICD-10-CM

## 2017-04-30 MED ORDER — LEVOTHYROXINE SODIUM 125 MCG PO TABS: 125.0000 ug | ORAL_TABLET | Freq: Every day | ORAL | 3 refills | Status: DC

## 2017-04-30 NOTE — Telephone Encounter (Signed)
Please inform patient the following information: - cholesterol improved from last year, much better (161--> 126 for LDL).  Please call pt, her thyroid is now under replaced. I have increased her dose to the 125 mcg, take daily on an empty stomach. Mail in pharmacy, if she needs script to local please send.  --> please ask her if she is taking biotin? If so this may be why we are seeing variation in her thyroid level. Biotin can interfere with some lab tests .  - I would like her to start the new dose called in, follow up in 8 weeks for retesting and if taking biotin I would like her to stop at least 4 weeks prior repeat testing.   Her alk phos was high, but she has a h/o of chronic elevated alk phos.

## 2017-05-01 MED ORDER — LEVOTHYROXINE SODIUM 125 MCG PO TABS: 125.0000 ug | ORAL_TABLET | Freq: Every day | ORAL | 0 refills | Status: DC

## 2017-05-01 NOTE — Telephone Encounter (Signed)
Spoke with patient reviewed lab results and instructions. Patient verbalized understanding. Sent 30 day supply of levothyroxine 163mcg to patient local pharmacy. Scheduled patient for 8 week follow up.

## 2017-06-05 DIAGNOSIS — H353132 Nonexudative age-related macular degeneration, bilateral, intermediate dry stage: Secondary | ICD-10-CM | POA: Diagnosis not present

## 2017-06-14 DIAGNOSIS — H353132 Nonexudative age-related macular degeneration, bilateral, intermediate dry stage: Secondary | ICD-10-CM | POA: Diagnosis not present

## 2017-06-14 DIAGNOSIS — H43813 Vitreous degeneration, bilateral: Secondary | ICD-10-CM | POA: Diagnosis not present

## 2017-06-18 ENCOUNTER — Telehealth: Payer: Self-pay | Admitting: Family Medicine

## 2017-06-18 DIAGNOSIS — I10 Essential (primary) hypertension: Secondary | ICD-10-CM

## 2017-06-18 MED ORDER — LISINOPRIL 5 MG PO TABS: 5.0000 mg | ORAL_TABLET | Freq: Every day | ORAL | 0 refills | Status: DC

## 2017-06-18 NOTE — Telephone Encounter (Signed)
Copied from [place] [id number]. Topic: Quick Communication - Rx Refill/Question >> Jun 18, 2017  8:34 AM [name] wrote: Medication: lisinopril (PRINIVIL,ZESTRIL) 5 MG tablet Has the patient contacted their pharmacy? Yes.   (Agent: If no, request that the patient contact the pharmacy for the refill.) Preferred Pharmacy (with phone number or street name): [place], [address] Agent: Please be advised that RX refills may take up to 3 business days. We ask that you follow-up with your pharmacy.

## 2017-06-24 ENCOUNTER — Ambulatory Visit: Payer: Medicare HMO | Admitting: Family Medicine

## 2017-06-24 ENCOUNTER — Other Ambulatory Visit: Payer: Self-pay | Admitting: Family Medicine

## 2017-06-24 ENCOUNTER — Telehealth: Payer: Self-pay | Admitting: Family Medicine

## 2017-06-24 ENCOUNTER — Other Ambulatory Visit (INDEPENDENT_AMBULATORY_CARE_PROVIDER_SITE_OTHER): Payer: Medicare HMO

## 2017-06-24 ENCOUNTER — Other Ambulatory Visit: Payer: Self-pay | Admitting: *Deleted

## 2017-06-24 DIAGNOSIS — E039 Hypothyroidism, unspecified: Secondary | ICD-10-CM

## 2017-06-24 DIAGNOSIS — I10 Essential (primary) hypertension: Secondary | ICD-10-CM

## 2017-06-24 LAB — TSH: TSH: 1.24 u[IU]/mL (ref 0.35–4.50)

## 2017-06-24 MED ORDER — LISINOPRIL 5 MG PO TABS: 5.0000 mg | ORAL_TABLET | Freq: Every day | ORAL | 0 refills | Status: DC

## 2017-06-24 NOTE — Telephone Encounter (Signed)
Thyroid level is now in normal range.

## 2017-06-25 NOTE — Telephone Encounter (Signed)
Left detailed message with results and instructions on patient voice mail per DPR 

## 2017-07-18 DIAGNOSIS — H1045 Other chronic allergic conjunctivitis: Secondary | ICD-10-CM | POA: Diagnosis not present

## 2017-07-18 DIAGNOSIS — H16223 Keratoconjunctivitis sicca, not specified as Sjogren's, bilateral: Secondary | ICD-10-CM | POA: Diagnosis not present

## 2017-09-02 ENCOUNTER — Ambulatory Visit (INDEPENDENT_AMBULATORY_CARE_PROVIDER_SITE_OTHER): Payer: Medicare HMO | Admitting: Family Medicine

## 2017-09-02 ENCOUNTER — Encounter: Payer: Self-pay | Admitting: Family Medicine

## 2017-09-02 ENCOUNTER — Ambulatory Visit: Payer: Self-pay | Admitting: *Deleted

## 2017-09-02 VITALS — BP 101/70 | HR 103 | Temp 97.8°F | Resp 20 | Ht 64.5 in | Wt 152.0 lb

## 2017-09-02 DIAGNOSIS — B0230 Zoster ocular disease, unspecified: Secondary | ICD-10-CM | POA: Diagnosis not present

## 2017-09-02 DIAGNOSIS — B0239 Other herpes zoster eye disease: Secondary | ICD-10-CM | POA: Diagnosis not present

## 2017-09-02 LAB — CBC WITH DIFFERENTIAL/PLATELET
BASOS PCT: 2.5 % (ref 0.0–3.0)
Basophils Absolute: 0.2 10*3/uL — ABNORMAL HIGH (ref 0.0–0.1)
Eosinophils Absolute: 0.2 10*3/uL (ref 0.0–0.7)
Eosinophils Relative: 3 % (ref 0.0–5.0)
HEMATOCRIT: 43.7 % (ref 36.0–46.0)
HEMOGLOBIN: 14.7 g/dL (ref 12.0–15.0)
Lymphocytes Relative: 17 % (ref 12.0–46.0)
Lymphs Abs: 1.2 10*3/uL (ref 0.7–4.0)
MCHC: 33.8 g/dL (ref 30.0–36.0)
MCV: 89.1 fl (ref 78.0–100.0)
Monocytes Absolute: 1.4 10*3/uL — ABNORMAL HIGH (ref 0.1–1.0)
Monocytes Relative: 19.9 % — ABNORMAL HIGH (ref 3.0–12.0)
NEUTROS ABS: 4.1 10*3/uL (ref 1.4–7.7)
Neutrophils Relative %: 57.6 % (ref 43.0–77.0)
Platelets: 256 10*3/uL (ref 150.0–400.0)
RBC: 4.9 Mil/uL (ref 3.87–5.11)
RDW: 14.4 % (ref 11.5–15.5)
WBC: 7.1 10*3/uL (ref 4.0–10.5)

## 2017-09-02 LAB — SEDIMENTATION RATE: Sed Rate: 25 mm/hr (ref 0–30)

## 2017-09-02 LAB — BASIC METABOLIC PANEL
BUN: 14 mg/dL (ref 6–23)
CALCIUM: 9.7 mg/dL (ref 8.4–10.5)
CO2: 26 meq/L (ref 19–32)
CREATININE: 0.9 mg/dL (ref 0.40–1.20)
Chloride: 95 mEq/L — ABNORMAL LOW (ref 96–112)
GFR: 64.07 mL/min (ref >60.00)
GLUCOSE: 104 mg/dL — AB (ref 70–99)
Potassium: 3.7 mEq/L (ref 3.5–5.1)
Sodium: 133 mEq/L — ABNORMAL LOW (ref 135–145)

## 2017-09-02 MED ORDER — TRAMADOL HCL 50 MG PO TABS: 50.0000 mg | ORAL_TABLET | Freq: Four times a day (QID) | ORAL | 0 refills | Status: DC | PRN

## 2017-09-02 MED ORDER — METHYLPREDNISOLONE ACETATE 80 MG/ML IJ SUSP
80.0000 mg | Freq: Once | INTRAMUSCULAR | Status: AC
Start: 2017-09-02 — End: 2017-09-02
  Administered 2017-09-02: 80 mg via INTRAMUSCULAR

## 2017-09-02 MED ORDER — VALACYCLOVIR HCL 1 G PO TABS: 1000.0000 mg | ORAL_TABLET | Freq: Three times a day (TID) | ORAL | 0 refills | Status: DC

## 2017-09-02 MED ORDER — PREDNISONE 20 MG PO TABS
ORAL_TABLET | ORAL | 0 refills | Status: DC
Start: 2017-09-02 — End: 2017-09-13

## 2017-09-02 MED ORDER — PREDNISONE 20 MG PO TABS: ORAL_TABLET | ORAL | 0 refills | Status: DC

## 2017-09-02 NOTE — Telephone Encounter (Addendum)
The pt called stating that she had dental work done on 08/27/17 (had teeth cleaned); on 08/28/17 her vision in her right eye was blurry and this worsened on 7//25/19; she states that her eye was getting progressively more red; she also states that she noticed swelling on 08/29/17; the pt further states it is 3/4 shut; additionally she states that she "is having what feels like she is having what feels like nerve pain from her head to her neck"; recommendations made per nurse triage protocol to include seeing a physician within 24 hours; pt request to see Dr Raoul Pitch only; pt offered and accepted 1530 appointment with Dr Raoul Pitch, [place]; she verbalizes understanding; will route to office for notification of this upcoming appointment.  Reason for Disposition . MODERATE-SEVERE eyelid swelling on one side  (Exception: due to a mosquito bite)  Answer Assessment - Initial Assessment Questions 1. ONSET: "When did the swelling start?" (e.g., minutes, hours, days)     08/29/17 2. LOCATION: "What part of the eyelids is swollen?"     Right eye top lid worse than bottom 3. SEVERITY: "How swollen is it?"     Moderate to severe (3/4 swollen shut) 4. ITCHING: "Is there any itching?" If so, ask: "How much?"   (Scale 1-10; mild, moderate or severe)     Not since taking zyrtec 5. PAIN: "Is the swelling painful to touch?" If so, ask: "How painful is it?"   (Scale 1-10; mild, moderate or severe)     milf 6. FEVER: "Do you have a fever?" If so, ask: "What is it, how was it measured, and when did it start?"      no 7. CAUSE: "What do you think is causing the swelling?"     unsure 8. RECURRENT SYMPTOM: "Have you had eyelid swelling before?" If so, ask: "When was the last time?" "What happened that time?"      9. OTHER SYMPTOMS: "Do you have any other symptoms?" (e.g., blurred vision, eye discharge, rash, runny nose)     Blurred vision; nasal allergies with drainage; redness; "nerve pain from head to neck on right  side" 10. PREGNANCY: "Is there any chance you are pregnant?" "When was your last menstrual period?"      no  Protocols used: EYE - [facility]

## 2017-09-02 NOTE — Telephone Encounter (Signed)
Called and scheduled patient to be seen at 1115 today

## 2017-09-02 NOTE — Patient Instructions (Addendum)
I will call you with labs once available.  Please make an appt with your eye doctor ASAP. I have placed an internal referral to her to help expedite this process. They will call to schedule.  Tramadol prescribed for pain.  Prednisone start tomorrow (since shot today) Valtrex start today.     Shingles Shingles is an infection that causes a painful skin rash and fluid-filled blisters. Shingles is caused by the same virus that causes chickenpox. Shingles only develops in people who:  Have had chickenpox.  Have gotten the chickenpox vaccine. (This is rare.)  The first symptoms of shingles may be itching, tingling, or pain in an area on your skin. A rash will follow in a few days or weeks. The rash is usually on one side of the body in a bandlike or beltlike pattern. Over time, the rash turns into fluid-filled blisters that break open, scab over, and dry up. Medicines may:  Help you manage pain.  Help you recover more quickly.  Help to prevent long-term problems.  Follow these instructions at home: Medicines  Take medicines only as told by your doctor.  Apply an anti-itch or numbing cream to the affected area as told by your doctor. Blister and Rash Care  Take a cool bath or put cool compresses on the area of the rash or blisters as told by your doctor. This may help with pain and itching.  Keep your rash covered with a loose bandage (dressing). Wear loose-fitting clothing.  Keep your rash and blisters clean with mild soap and cool water or as told by your doctor.  Check your rash every day for signs of infection. These include redness, swelling, and pain that lasts or gets worse.  Do not pick your blisters.  Do not scratch your rash. General instructions  Rest as told by your doctor.  Keep all follow-up visits as told by your doctor. This is important.  Until your blisters scab over, your infection can cause chickenpox in people who have never had it or been vaccinated  against it. To prevent this from happening, avoid touching other people or being around other people, especially: ? Babies. ? Pregnant women. ? Children who have eczema. ? Elderly people who have transplants. ? People who have chronic illnesses, such as leukemia or AIDS. Contact a doctor if:  Your pain does not get better with medicine.  Your pain does not get better after the rash heals.  Your rash looks infected. Signs of infection include: ? Redness. ? Swelling. ? Pain that lasts or gets worse. Get help right away if:  The rash is on your face or nose.  You have pain in your face, pain around your eye area, or loss of feeling on one side of your face.  You have ear pain or you have ringing in your ear.  You have loss of taste.  Your condition gets worse. This information is not intended to replace advice given to you by your health care provider. Make sure you discuss any questions you have with your health care provider. Document Released: 07/11/2007 Document Revised: 09/18/2015 Document Reviewed: 11/03/2013 Elsevier Interactive Patient Education  [employer].

## 2017-09-02 NOTE — Progress Notes (Signed)
Denise Fuller , 01/31/1936, 82 y.o., female MRN: [id number] Patient Care Team    Relationship Specialty Notifications Start End  Denise Hillock, DO PCP - General Family Medicine  01/10/16   Denise Fuller, OD Referring Physician   01/10/16    Comment: ophth- macular degeneration  [place]  [facility]  04/26/17     Chief Complaint  Patient presents with  . Facial Swelling    dizziness,headache     Subjective: Pt presents for an OV with complaints of redness and swelling of 5 days duration.  Associated symptoms include fatigue, nausea, headache, hoarseness and pain with brushing her hair over rash. She is present w/ her daughter today. She states she had her teeth cleaned Tuesday. The next day she felt more tired than usual and thought she was coming down w/ a cold because her throat was scratchy as well. Thursday she noticed the rash starting around her eye. She denies fever, chills, vomit. She may have noticed mild blurriness of her eyes. Hearing unchanged.  She had the zostavax > 3 years ago.   Depression screen [facility] 2/9 04/26/2017 04/23/2016 01/10/2016  Decreased Interest 0 0 0  Down, Depressed, Hopeless 0 0 0  PHQ - 2 Score 0 0 0    Allergies  Allergen Reactions  . Doxycycline     GI upset  . Keflex [Cephalexin] Hives and Itching   Social History   Tobacco Use  . Smoking status: Never Smoker  . Smokeless tobacco: Never Used  Substance Use Topics  . Alcohol use: No   Past Medical History:  Diagnosis Date  . Bartonella infection   . BCC (basal cell carcinoma of skin) 1997   nose  . Chickenpox   . Chronic venous insufficiency   . Diverticulosis   . Elevated alkaline phosphatase level   . Elevated LDH   . Environmental allergies   . GAD (generalized anxiety disorder)   . Groin abscess    culture negative, doxy resolved abscess  . Hepatitis A   . Hyperlipidemia   . Hypertension   . Hypothyroidism   . Lymphadenopathy, cervical    chronic per pt. (left)  .  Macular degeneration   . Meniere disease   . Thyroid disease    Past Surgical History:  Procedure Laterality Date  . ABDOMINAL HYSTERECTOMY  1991  . APPENDECTOMY  1967  . BASAL CELL CARCINOMA EXCISION  1997   nose  . BLEPHAROPLASTY    . CATARACT EXTRACTION W/ INTRAOCULAR LENS IMPLANT Bilateral Oct and Nov 2018  . CHOLECYSTECTOMY  1967   Family History  Problem Relation Age of Onset  . Arthritis Mother   . Heart disease Mother   . Arthritis Father   . Aneurysm Father 21       brain  . Thyroid disease Daughter    Allergies as of 09/02/2017      Reactions   Doxycycline    GI upset   Keflex [cephalexin] Hives, Itching      Medication List        Accurate as of 09/02/17 11:16 AM. Always use your most recent med list.          aspirin EC 81 MG tablet Take 81 mg by mouth daily.   Biotin 5000 MCG Tabs Take by mouth.   calcium citrate-vitamin D 315-200 MG-UNIT tablet Commonly known as:  CITRACAL+D Take 1 tablet by mouth 2 (two) times daily.   cetirizine 10 MG tablet Commonly known as:  ZYRTEC Take 10 mg by mouth daily.   co-enzyme Q-10 30 MG capsule Take 30 mg by mouth 3 (three) times daily.   Cranberry 500 MG Tabs Take 1 tablet by mouth 2 (two) times daily.   DUREZOL 0.05 % Emul Generic drug:  Difluprednate INSTILL 1 DROP INTO LEFT EYE 3 TIMES A DAY   FLAX SEED OIL PO Take 1,300 mg by mouth 2 (two) times daily.   glucosamine-chondroitin 500-400 MG tablet Take 1 tablet by mouth 3 (three) times daily.   levothyroxine 125 MCG tablet Commonly known as:  SYNTHROID, LEVOTHROID Take 1 tablet (125 mcg total) by mouth daily.   lisinopril 5 MG tablet Commonly known as:  PRINIVIL,ZESTRIL Take 1 tablet (5 mg total) by mouth daily. Needs office visit prior to anymore refills   OMEGA-3 COMPLEX PO Take 360 mg by mouth daily.   polyethylene glycol packet Commonly known as:  MIRALAX / GLYCOLAX Take 17 g by mouth daily.   PROLENSA 0.07 % Soln Generic drug:   Bromfenac Sodium PUT 1 DROP INTO LEFT EYE AT BEDTIME   SYSTANE OP Apply to eye.   traZODone 50 MG tablet Commonly known as:  DESYREL Take 0.5-1 tablets (25-50 mg total) by mouth at bedtime as needed for sleep.   triamterene-hydrochlorothiazide 37.5-25 MG capsule Commonly known as:  DYAZIDE Take 1 each (1 capsule total) by mouth daily.   Vitamin D3 1000 units Caps Take by mouth.       All past medical history, surgical history, allergies, family history, immunizations andmedications were updated in the EMR today and reviewed under the history and medication portions of their EMR.     ROS: Negative, with the exception of above mentioned in HPI   Objective:  BP 101/70 (BP Location: Right Arm, Patient Position: Sitting, Cuff Size: Normal)   Pulse (!) 103   Temp 97.8 F (36.6 C)   Resp 20   Ht 5' 4.5" (1.638 m)   Wt 152 lb (68.9 kg)   SpO2 98%   BMI 25.69 kg/m  Body mass index is 25.69 kg/m. Gen: Afebrile. No acute distress. Nontoxic in appearance, well developed, well nourished.  HENT: AT. [place]. Bilateral TM visualized without erythema or buldging. MMM, x1 ulceration right lip commissure. Otherwise no  oral lesions. No teeth or gum pain. No redness or draining of gumline. Bilateral nares without erythema or lesions. Throat without erythema or exudates. Hoarseness present. No cough Eyes:Pupils Equal Round Reactive to light (pain with light right eye), Extraocular movements intact,  Conjunctiva without redness, discharge or icterus. Neck/lymp/endocrine: Supple,right occipital lymphadenopathy x1.  CV: RRR (mildly tachy). No murmur Chest: CTAB, no wheeze or crackles. Good air movement, normal resp effort.  Skin: macular papular rash right eye medial canthus extending to temple and posteriorly over forehead and hairline. , no purpura or petechiae.  Neuro: Normal gait. PERLA. EOMi. Alert. Oriented x3. Negative kernig. Psych: Normal affect, dress and demeanor. Normal speech. Normal  thought content and judgment.  No exam data present No results found. No results found for this or any previous visit (from the past 24 hour(s)).  Assessment/Plan: Denise Fuller is a 82 y.o. female present for OV for  Herpes zoster with ophthalmic complication, unspecified herpes zoster eye disease - swelling and rash appears consistent with shingles. Considered allergy to gloves/intstruments and bacterial infection from dental procedure--> however, neither are consistent with exam today. Rash surrounding right eye and extends into hairline. + right occipital lymph node. - VSS, but she is  mildly tachy and BP lower than her normal. hold diuretic for 2-3 days until eating and drinking normal. If worsening sx, neck pain  or fever--> ED. Her daughter has had meningitis before and they are aware of the signs/symptoms. - CBC w/Diff - Basic Metabolic Panel (BMET) - Sedimentation rate - Ambulatory referral to Ophthalmology--> Appt made for today.  - start tomorrow: predniSONE (DELTASONE) 20 MG tablet; 60 mg x3d, 40 mg x3d, 20 mg x2d, 10 mg x2d  Dispense: 18 tablet; Refill: 0 - Start immediately: valACYclovir (VALTREX) 1000 MG tablet; Take 1 tablet (1,000 mg total) by mouth 3 (three) times daily.  Dispense: 30 tablet; Refill: 0 - provided today: methylPREDNISolone acetate (DEPO-MEDROL) injection 80 mg - F/U Friday. If worsening, fever or chills she is to be seen in ED immediately.    Reviewed expectations re: course of current medical issues.  Discussed self-management of symptoms.  Outlined signs and symptoms indicating need for more acute intervention.  Patient verbalized understanding and all questions were answered.  Patient received an After-Visit Summary.    No orders of the defined types were placed in this encounter.    Note is dictated utilizing voice recognition software. Although note has been proof read prior to signing, occasional typographical errors still can be missed. If  any questions arise, please do not hesitate to call for verification.   electronically signed by:  Howard Pouch, DO  [place]

## 2017-09-02 NOTE — Telephone Encounter (Signed)
Contacted Seth Bake at [facility] regarding pt appointment; information previously routed to office; she states that Dr Raoul Pitch will review this information, and her nurse will contact the pt if needed.

## 2017-09-03 ENCOUNTER — Telehealth: Payer: Self-pay | Admitting: Family Medicine

## 2017-09-03 NOTE — Telephone Encounter (Signed)
Spoke with patient reviewed information and instructions. Patient scheduled for follow up on Thursday 09/05/17

## 2017-09-03 NOTE — Telephone Encounter (Signed)
Please inform patient the following information: Her labs are consistent with shingles over bacterial infection with a high elevation in monocytes- which is a type of white blood cell.  She also has mildly low sodium and chloride--> which is from not eating/drinking well.  - Continue to not use the diuretic until Friday (unless she experiences edema/swelling--> then she can restart sooner). Drink not only water, but add some Gatorade to help replace electrolytes as well.

## 2017-09-05 ENCOUNTER — Encounter: Payer: Self-pay | Admitting: Family Medicine

## 2017-09-05 ENCOUNTER — Ambulatory Visit (INDEPENDENT_AMBULATORY_CARE_PROVIDER_SITE_OTHER): Payer: Medicare HMO | Admitting: Family Medicine

## 2017-09-05 VITALS — BP 111/74 | HR 76 | Temp 98.1°F | Resp 16 | Wt 156.0 lb

## 2017-09-05 DIAGNOSIS — B029 Zoster without complications: Secondary | ICD-10-CM

## 2017-09-05 MED ORDER — ZOSTER VAC RECOMB ADJUVANTED 50 MCG/0.5ML IM SUSR: 0.5000 mL | Freq: Once | INTRAMUSCULAR | 1 refills | Status: AC

## 2017-09-05 NOTE — Patient Instructions (Signed)
Get shingrix vaccine #1 in about 2-3 weeks, then repeat dose in 2-6 months.  Your eye looks so much better.  Finish the medications as prescribed.   I am glad you are doing so much.     Shingles Shingles is an infection that causes a painful skin rash and fluid-filled blisters. Shingles is caused by the same virus that causes chickenpox. Shingles only develops in people who:  Have had chickenpox.  Have gotten the chickenpox vaccine. (This is rare.)  The first symptoms of shingles may be itching, tingling, or pain in an area on your skin. A rash will follow in a few days or weeks. The rash is usually on one side of the body in a bandlike or beltlike pattern. Over time, the rash turns into fluid-filled blisters that break open, scab over, and dry up. Medicines may:  Help you manage pain.  Help you recover more quickly.  Help to prevent long-term problems.  Follow these instructions at home: Medicines  Take medicines only as told by your doctor.  Apply an anti-itch or numbing cream to the affected area as told by your doctor. Blister and Rash Care  Take a cool bath or put cool compresses on the area of the rash or blisters as told by your doctor. This may help with pain and itching.  Keep your rash covered with a loose bandage (dressing). Wear loose-fitting clothing.  Keep your rash and blisters clean with mild soap and cool water or as told by your doctor.  Check your rash every day for signs of infection. These include redness, swelling, and pain that lasts or gets worse.  Do not pick your blisters.  Do not scratch your rash. General instructions  Rest as told by your doctor.  Keep all follow-up visits as told by your doctor. This is important.  Until your blisters scab over, your infection can cause chickenpox in people who have never had it or been vaccinated against it. To prevent this from happening, avoid touching other people or being around other people,  especially: ? Babies. ? Pregnant women. ? Children who have eczema. ? Elderly people who have transplants. ? People who have chronic illnesses, such as leukemia or AIDS. Contact a doctor if:  Your pain does not get better with medicine.  Your pain does not get better after the rash heals.  Your rash looks infected. Signs of infection include: ? Redness. ? Swelling. ? Pain that lasts or gets worse. Get help right away if:  The rash is on your face or nose.  You have pain in your face, pain around your eye area, or loss of feeling on one side of your face.  You have ear pain or you have ringing in your ear.  You have loss of taste.  Your condition gets worse. This information is not intended to replace advice given to you by your health care provider. Make sure you discuss any questions you have with your health care provider. Document Released: 07/11/2007 Document Revised: 09/18/2015 Document Reviewed: 11/03/2013 Elsevier Interactive Patient Education  [employer].

## 2017-09-05 NOTE — Progress Notes (Signed)
Denise Fuller , 16-Jul-1935, 82 y.o., female MRN: [id number] Patient Care Team    Relationship Specialty Notifications Start End  Denise Hillock, DO PCP - General Family Medicine  01/10/16   Denise Fuller, OD Referring Physician   01/10/16    Comment: ophth- macular degeneration  Den[facility] Fuller  Dentistry  04/26/17     Chief Complaint  Patient presents with  . Follow-up    shingles     Subjective:  Pt presents for recheck today. Her shingles infection surrounding her right eye is much improved. She reports her eye exam went well and no complications from the shingles infection. She states it is much better and more comfortable today. She is tolerating the prednisone and valtrex.  Prior note;  Pt presents for an OV with complaints of redness and swelling of 5 days duration.  Associated symptoms include fatigue, nausea, headache, hoarseness and pain with brushing her hair over rash. She is present w/ her daughter today. She states she had her teeth cleaned Tuesday. The next day she felt more tired than usual and thought she was coming down w/ a cold because her throat was scratchy as well. Thursday she noticed the rash starting around her eye. She denies fever, chills, vomit. She may have noticed mild blurriness of her eyes. Hearing unchanged.  She had the zostavax > 3 years ago.   Depression screen [facility] 2/9 04/26/2017 04/23/2016 01/10/2016  Decreased Interest 0 0 0  Down, Depressed, Hopeless 0 0 0  PHQ - 2 Score 0 0 0    Allergies  Allergen Reactions  . Doxycycline     GI upset  . Keflex [Cephalexin] Hives and Itching   Social History   Tobacco Use  . Smoking status: Never Smoker  . Smokeless tobacco: Never Used  Substance Use Topics  . Alcohol use: No   Past Medical History:  Diagnosis Date  . Bartonella infection   . BCC (basal cell carcinoma of skin) 1997   nose  . Chickenpox   . Chronic venous insufficiency   . Diverticulosis   . Elevated alkaline phosphatase level     . Elevated LDH   . Environmental allergies   . GAD (generalized anxiety disorder)   . Groin abscess    culture negative, doxy resolved abscess  . Hepatitis A   . Hyperlipidemia   . Hypertension   . Hypothyroidism   . Lymphadenopathy, cervical    chronic per pt. (left)  . Macular degeneration   . Meniere disease   . Thyroid disease    Past Surgical History:  Procedure Laterality Date  . ABDOMINAL HYSTERECTOMY  1991  . APPENDECTOMY  1967  . BASAL CELL CARCINOMA EXCISION  1997   nose  . BLEPHAROPLASTY    . CATARACT EXTRACTION W/ INTRAOCULAR LENS IMPLANT Bilateral Oct and Nov 2018  . CHOLECYSTECTOMY  1967   Family History  Problem Relation Age of Onset  . Arthritis Mother   . Heart disease Mother   . Arthritis Father   . Aneurysm Father 51       brain  . Thyroid disease Daughter    Allergies as of 09/05/2017      Reactions   Doxycycline    GI upset   Keflex [cephalexin] Hives, Itching      Medication List        Accurate as of 09/05/17 11:01 AM. Always use your most recent med list.  aspirin EC 81 MG tablet Take 81 mg by mouth daily.   Biotin 5000 MCG Tabs Take by mouth.   calcium citrate-vitamin D 315-200 MG-UNIT tablet Commonly known as:  CITRACAL+D Take 1 tablet by mouth 2 (two) times daily.   cetirizine 10 MG tablet Commonly known as:  ZYRTEC Take 10 mg by mouth daily.   co-enzyme Q-10 30 MG capsule Take 30 mg by mouth 3 (three) times daily.   Cranberry 500 MG Tabs Take 1 tablet by mouth 2 (two) times daily.   DUREZOL 0.05 % Emul Generic drug:  Difluprednate INSTILL 1 DROP INTO LEFT EYE 3 TIMES A DAY   FLAX SEED OIL PO Take 1,300 mg by mouth 2 (two) times daily.   glucosamine-chondroitin 500-400 MG tablet Take 1 tablet by mouth 3 (three) times daily.   levothyroxine 125 MCG tablet Commonly known as:  SYNTHROID, LEVOTHROID Take 1 tablet (125 mcg total) by mouth daily.   lisinopril 5 MG tablet Commonly known as:   PRINIVIL,ZESTRIL Take 1 tablet (5 mg total) by mouth daily. Needs office visit prior to anymore refills   OMEGA-3 COMPLEX PO Take 360 mg by mouth daily.   polyethylene glycol packet Commonly known as:  MIRALAX / GLYCOLAX Take 17 g by mouth daily.   predniSONE 20 MG tablet Commonly known as:  DELTASONE 60 mg x3d, 40 mg x3d, 20 mg x2d, 10 mg x2d   predniSONE 5 MG tablet Commonly known as:  DELTASONE   PROLENSA 0.07 % Soln Generic drug:  Bromfenac Sodium PUT 1 DROP INTO LEFT EYE AT BEDTIME   SYSTANE OP Apply to eye.   traMADol 50 MG tablet Commonly known as:  ULTRAM Take 1 tablet (50 mg total) by mouth every 6 (six) hours as needed.   traZODone 50 MG tablet Commonly known as:  DESYREL Take 0.5-1 tablets (25-50 mg total) by mouth at bedtime as needed for sleep.   triamterene-hydrochlorothiazide 37.5-25 MG capsule Commonly known as:  DYAZIDE Take 1 each (1 capsule total) by mouth daily.   valACYclovir 1000 MG tablet Commonly known as:  VALTREX Take 1 tablet (1,000 mg total) by mouth 3 (three) times daily.   Vitamin D3 1000 units Caps Take by mouth.       All past medical history, surgical history, allergies, family history, immunizations andmedications were updated in the EMR today and reviewed under the history and medication portions of their EMR.     ROS: Negative, with the exception of above mentioned in HPI   Objective:  BP 111/74 (BP Location: Left Arm, Patient Position: Sitting, Cuff Size: Normal)   Pulse 76   Temp 98.1 F (36.7 C) (Oral)   Resp 16   Wt 156 lb (70.8 kg)   SpO2 96%   BMI 26.36 kg/m  Body mass index is 26.36 kg/m. Gen: Afebrile. No acute distress. Nontoxic. Looks much better today.  HENT: AT. [place].MMM.  Eyes:Pupils Equal Round Reactive to light, Extraocular movements intact,  Conjunctiva without redness, discharge or icterus. Skin: erythema rash now with dried/scabbed blisters, rash is improving, rash medial aspect of eye is almost  completely resolved.   No purpura or petechiae.  Neuro: Normal gait. PERLA. EOMi. Alert. Oriented x3  No exam data present No results found. No results found for this or any previous visit (from the past 24 hour(s)).  Assessment/Plan: Denise Fuller is a 82 y.o. female present for OV for  Herpes zoster with ophthalmic complication, unspecified herpes zoster eye disease - much improved.  -  continue prednisone and valtrex.  - eye exam by ophthalmology was good.  - shingrix series to start in 3-4 weeks. Script provided.  - F/U PRN   Reviewed expectations re: course of current medical issues.  Discussed self-management of symptoms.  Outlined signs and symptoms indicating need for more acute intervention.  Patient verbalized understanding and all questions were answered.  Patient received an After-Visit Summary.    No orders of the defined types were placed in this encounter.    Note is dictated utilizing voice recognition software. Although note has been proof read prior to signing, occasional typographical errors still can be missed. If any questions arise, please do not hesitate to call for verification.   electronically signed by:  Howard Pouch, DO  [place]

## 2017-09-13 ENCOUNTER — Ambulatory Visit (INDEPENDENT_AMBULATORY_CARE_PROVIDER_SITE_OTHER): Payer: Medicare HMO | Admitting: Family Medicine

## 2017-09-13 ENCOUNTER — Encounter: Payer: Self-pay | Admitting: Family Medicine

## 2017-09-13 VITALS — BP 131/89 | HR 75 | Temp 98.1°F | Resp 20 | Ht 64.5 in | Wt 167.0 lb

## 2017-09-13 DIAGNOSIS — T148XXA Other injury of unspecified body region, initial encounter: Secondary | ICD-10-CM | POA: Diagnosis not present

## 2017-09-13 MED ORDER — CYCLOBENZAPRINE HCL 5 MG PO TABS: 5.0000 mg | ORAL_TABLET | Freq: Every day | ORAL | 0 refills | Status: DC

## 2017-09-13 NOTE — Patient Instructions (Addendum)
I have called you in the flexeril (cyclobenzaprine) to use at night to help relax muscles. This is probably from you compensating from the pain of the shingles.   Start the diuretic again.   I hope you guys have a good trip.

## 2017-09-13 NOTE — Progress Notes (Signed)
Denise Fuller , 1935/09/04, 82 y.o., female MRN: [id number] Patient Care Team    Relationship Specialty Notifications Start End  Ma Hillock, DO PCP - General Family Medicine  01/10/16   Otelia Sergeant, OD Referring Physician   01/10/16    Comment: ophth- macular degeneration  [facility]  04/26/17     Chief Complaint  Patient presents with  . Shoulder Pain    right side     Subjective: Pt presents for an OV with complaints of right sided/neck shoulder pain of 3-4 days duration.  Associated symptoms include recent shingles outbreak on surrounding the right eye and scalp. She reports the pain is worse at night and is throbbing in nature. She feels discomfort with deep breaths. She feels improvement with raising her arm/stretching.   Depression screen [facility] 2/9 04/26/2017 04/23/2016 01/10/2016  Decreased Interest 0 0 0  Down, Depressed, Hopeless 0 0 0  PHQ - 2 Score 0 0 0    Allergies  Allergen Reactions  . Doxycycline     GI upset  . Keflex [Cephalexin] Hives and Itching   Social History   Tobacco Use  . Smoking status: Never Smoker  . Smokeless tobacco: Never Used  Substance Use Topics  . Alcohol use: No   Past Medical History:  Diagnosis Date  . Bartonella infection   . BCC (basal cell carcinoma of skin) 1997   nose  . Chickenpox   . Chronic venous insufficiency   . Diverticulosis   . Elevated alkaline phosphatase level   . Elevated LDH   . Environmental allergies   . GAD (generalized anxiety disorder)   . Groin abscess    culture negative, doxy resolved abscess  . Hepatitis A   . Hyperlipidemia   . Hypertension   . Hypothyroidism   . Lymphadenopathy, cervical    chronic per pt. (left)  . Macular degeneration   . Meniere disease   . Thyroid disease    Past Surgical History:  Procedure Laterality Date  . ABDOMINAL HYSTERECTOMY  1991  . APPENDECTOMY  1967  . BASAL CELL CARCINOMA EXCISION  1997   nose  . BLEPHAROPLASTY    . CATARACT  EXTRACTION W/ INTRAOCULAR LENS IMPLANT Bilateral Oct and Nov 2018  . CHOLECYSTECTOMY  1967   Family History  Problem Relation Age of Onset  . Arthritis Mother   . Heart disease Mother   . Arthritis Father   . Aneurysm Father 31       brain  . Thyroid disease Daughter    Allergies as of 09/13/2017      Reactions   Doxycycline    GI upset   Keflex [cephalexin] Hives, Itching      Medication List        Accurate as of 09/13/17  2:01 PM. Always use your most recent med list.          aspirin EC 81 MG tablet Take 81 mg by mouth daily.   Biotin 5000 MCG Tabs Take by mouth.   calcium citrate-vitamin D 315-200 MG-UNIT tablet Commonly known as:  CITRACAL+D Take 1 tablet by mouth 2 (two) times daily.   cetirizine 10 MG tablet Commonly known as:  ZYRTEC Take 10 mg by mouth daily.   co-enzyme Q-10 30 MG capsule Take 30 mg by mouth 3 (three) times daily.   Cranberry 500 MG Tabs Take 1 tablet by mouth 2 (two) times daily.   DUREZOL 0.05 % Emul Generic drug:  Difluprednate INSTILL 1 DROP INTO LEFT EYE 3 TIMES A DAY   FLAX SEED OIL PO Take 1,300 mg by mouth 2 (two) times daily.   glucosamine-chondroitin 500-400 MG tablet Take 1 tablet by mouth 3 (three) times daily.   levothyroxine 125 MCG tablet Commonly known as:  SYNTHROID, LEVOTHROID Take 1 tablet (125 mcg total) by mouth daily.   lisinopril 5 MG tablet Commonly known as:  PRINIVIL,ZESTRIL Take 1 tablet (5 mg total) by mouth daily. Needs office visit prior to anymore refills   OMEGA-3 COMPLEX PO Take 360 mg by mouth daily.   polyethylene glycol packet Commonly known as:  MIRALAX / GLYCOLAX Take 17 g by mouth daily.   PROLENSA 0.07 % Soln Generic drug:  Bromfenac Sodium PUT 1 DROP INTO LEFT EYE AT BEDTIME   SYSTANE OP Apply to eye.   traMADol 50 MG tablet Commonly known as:  ULTRAM Take 1 tablet (50 mg total) by mouth every 6 (six) hours as needed.   traZODone 50 MG tablet Commonly known as:   DESYREL Take 0.5-1 tablets (25-50 mg total) by mouth at bedtime as needed for sleep.   triamterene-hydrochlorothiazide 37.5-25 MG capsule Commonly known as:  DYAZIDE Take 1 each (1 capsule total) by mouth daily.   Vitamin D3 1000 units Caps Take by mouth.       All past medical history, surgical history, allergies, family history, immunizations andmedications were updated in the EMR today and reviewed under the history and medication portions of their EMR.     ROS: Negative, with the exception of above mentioned in HPI   Objective:  BP 131/89 (BP Location: Right Arm, Patient Position: Sitting, Cuff Size: Large)   Pulse 75   Temp 98.1 F (36.7 C)   Resp 20   Ht 5' 4.5" (1.638 m)   Wt 167 lb (75.8 kg)   SpO2 97%   BMI 28.22 kg/m  Body mass index is 28.22 kg/m. Gen: Afebrile. No acute distress. Nontoxic in appearance, well developed, well nourished.  HENT: AT. [place]. MMM Eyes:Pupils Equal Round Reactive to light, Extraocular movements intact,  Conjunctiva without redness, discharge or icterus. CV: RRR  Chest: CTAB, no wheeze or crackles.   MSK: no erythema, no soft tissue swelling. Mild TTP to right midtrap near tenderpoint. Full ROM shoulder and neck. NV intact distally.  Skin: shingles rash is greatly improved, a ew scabs remain. Neuro: Normal gait. PERLA. EOMi. Alert. Oriented x3   No exam data present No results found. No results found for this or any previous visit (from the past 24 hour(s)).  Assessment/Plan: Denise Fuller is a 82 y.o. female present for OV for  Muscle strain - New problem. mild TTP today. Pain likely secondary from compensation from shingles pain right side of head.  - Flexeril 5 mg QHS prescribed. Stretches.  - F/u PRN   Reviewed expectations re: course of current medical issues.  Discussed self-management of symptoms.  Outlined signs and symptoms indicating need for more acute intervention.  Patient verbalized understanding and all  questions were answered.  Patient received an After-Visit Summary.    No orders of the defined types were placed in this encounter.   > 15 minutes spent with patient, >50% of time spent face to face  Note is dictated utilizing voice recognition software. Although note has been proof read prior to signing, occasional typographical errors still can be missed. If any questions arise, please do not hesitate to call for verification.   electronically signed  by:  Howard Pouch, DO  [place]

## 2017-09-18 DIAGNOSIS — B0239 Other herpes zoster eye disease: Secondary | ICD-10-CM | POA: Diagnosis not present

## 2017-09-27 ENCOUNTER — Telehealth: Payer: Self-pay | Admitting: Family Medicine

## 2017-09-27 NOTE — Telephone Encounter (Signed)
Please advise 

## 2017-09-27 NOTE — Telephone Encounter (Signed)
Yes, she can take the valtrex as prescribed on the bottle

## 2017-09-27 NOTE — Telephone Encounter (Signed)
Relation to pt: self   Call back number: [phone]  Pharmacy:  [id number] - [place], [place] [phone] (Phone)  [phone] (Fax)    Reason for call:    Patient last seen 09/05/17 for "shingles" patient states she's a little stressed due to spouse having emergency knee surgery today and she's currently experiencing a flare up. Patient states her eye is swollen not crusty or red, side of head sensitive totouch. Patient states the medication prescribed at 09/05/17 was sent to retail and mail order therefore she has a dbl batch and would like to know if she can take medication. (medication name is unknown) please advise

## 2017-09-27 NOTE — Telephone Encounter (Signed)
Patient notified and verbalized understanding. 

## 2017-10-01 ENCOUNTER — Ambulatory Visit: Payer: Self-pay | Admitting: Family Medicine

## 2017-10-01 NOTE — Telephone Encounter (Signed)
Spoke with patient she states her heart has been racing at times today it has not been continuous, she states she is not having any other symptoms at this time. Advised patient if she continues to have accelerated HR and has any chest pain,SHOB,dizziness,fever or nausea go to ED. Patient verbalized understanding of all instructions.

## 2017-10-01 NOTE — Telephone Encounter (Signed)
Called in c/o her heart feeling fast.   She is taking Valtrex for Shingles.   She is wondering if she should stop taking the Valtrex because it is causing her heart to beat fast. She used her BP machine.   See readings below.   BP find but pulse was elevated. She denies any symptoms with this. She wanted me to ask Dr. Raoul Pitch if she should stop the medicine.    I routed a note to Dr. Raoul Pitch.   I let pt know someone would be in contact with her.   Reason for Disposition . [1] Palpitations AND [2] no improvement after using CARE ADVICE    Tachycardia related to shingles medication.  Answer Assessment - Initial Assessment Questions 1. DESCRIPTION: "Please describe your heart rate or heart beat that you are having" (e.g., fast/slow, regular/irregular, skipped or extra beats, "palpitations")     My pulse is fast.  150 at first now it's 134.   I have Shingles.   I'm on medicine for Shingles.    2. ONSET: "When did it start?" (Minutes, hours or days)      Just now my pulse went high.   I just took a pill.   I was just in the shower.   I felt my heart race.     I'm under a lot of stress with my husband. 3. DURATION: "How long does it last" (e.g., seconds, minutes, hours)     121/83 151 pulse  112/87 134 pulse. 4. PATTERN "Does it come and go, or has it been constant since it started?"  "Does it get worse with exertion?"   "Are you feeling it now?"      Denies shortness of breath and no chest pain.   I feel like I have eaten too much.   I had some tuna fish and cheese a little while ago. 5. TAP: "Using your hand, can you tap out what you are feeling on a chair or table in front of you, so that I can hear?" (Note: not all patients can do this)       Using BP machine for pulse.   6. HEART RATE: "Can you tell me your heart rate?" "How many beats in 15 seconds?"  (Note: not all patients can do this)       135/102.  Pulse 90.   7. RECURRENT SYMPTOM: "Have you ever had this before?" If so, ask: "When was  the last time?" and "What happened that time?"      Never happened before. 8. CAUSE: "What do you think is causing the palpitations?"     Stress with my husband and maybe the medication for the shingles. 9. CARDIAC HISTORY: "Do you have any history of heart disease?" (e.g., heart attack, angina, bypass surgery, angioplasty, arrhythmia)      No history 10. OTHER SYMPTOMS: "Do you have any other symptoms?" (e.g., dizziness, chest pain, sweating, difficulty breathing)       No dizziness or sweating.   11. PREGNANCY: "Is there any chance you are pregnant?" "When was your last menstrual period?"       Not asked due to age  Protocols used: [place]

## 2017-11-07 ENCOUNTER — Other Ambulatory Visit: Payer: Self-pay | Admitting: *Deleted

## 2017-11-07 DIAGNOSIS — I10 Essential (primary) hypertension: Secondary | ICD-10-CM

## 2017-11-07 MED ORDER — TRIAMTERENE-HCTZ 37.5-25 MG PO CAPS: 1.0000 | ORAL_CAPSULE | Freq: Every day | ORAL | 0 refills | Status: DC

## 2017-11-11 DIAGNOSIS — H35423 Microcystoid degeneration of retina, bilateral: Secondary | ICD-10-CM | POA: Diagnosis not present

## 2017-11-11 DIAGNOSIS — H43813 Vitreous degeneration, bilateral: Secondary | ICD-10-CM | POA: Diagnosis not present

## 2017-11-11 DIAGNOSIS — H353132 Nonexudative age-related macular degeneration, bilateral, intermediate dry stage: Secondary | ICD-10-CM | POA: Diagnosis not present

## 2017-11-27 ENCOUNTER — Ambulatory Visit (INDEPENDENT_AMBULATORY_CARE_PROVIDER_SITE_OTHER): Payer: Medicare HMO

## 2017-11-27 DIAGNOSIS — Z23 Encounter for immunization: Secondary | ICD-10-CM

## 2017-12-23 ENCOUNTER — Other Ambulatory Visit: Payer: Self-pay

## 2017-12-23 DIAGNOSIS — I10 Essential (primary) hypertension: Secondary | ICD-10-CM

## 2017-12-23 MED ORDER — LISINOPRIL 5 MG PO TABS: 5.0000 mg | ORAL_TABLET | Freq: Every day | ORAL | 0 refills | Status: DC

## 2017-12-24 ENCOUNTER — Other Ambulatory Visit: Payer: Self-pay | Admitting: Family Medicine

## 2017-12-24 DIAGNOSIS — I10 Essential (primary) hypertension: Secondary | ICD-10-CM

## 2017-12-24 NOTE — Telephone Encounter (Signed)
Copied from [place] [id number]. Topic: Quick Communication - Rx Refill/Question >> Dec 24, 2017  3:06 PM Rayann Heman wrote: Medication: triamterene-hydrochlorothiazide (DYAZIDE) 37.5-25 MG capsule [[id number]] lisinopril (PRINIVIL,ZESTRIL) 5 MG tablet [[id number]]   Has the patient contacted their pharmacy? No Preferred Pharmacy (with phone number or street name): [place], [place] [phone] (Phone) [phone] (Fax)  Agent: Please be advised that RX refills may take up to 3 business days. We ask that you follow-up with your pharmacy.

## 2017-12-25 ENCOUNTER — Encounter: Payer: Self-pay | Admitting: Family Medicine

## 2017-12-25 ENCOUNTER — Ambulatory Visit (INDEPENDENT_AMBULATORY_CARE_PROVIDER_SITE_OTHER): Payer: Medicare HMO | Admitting: Family Medicine

## 2017-12-25 VITALS — BP 103/72 | HR 77 | Temp 97.5°F | Resp 20 | Ht 64.5 in | Wt 167.0 lb

## 2017-12-25 DIAGNOSIS — R8290 Unspecified abnormal findings in urine: Secondary | ICD-10-CM

## 2017-12-25 DIAGNOSIS — R350 Frequency of micturition: Secondary | ICD-10-CM

## 2017-12-25 LAB — POC URINALSYSI DIPSTICK (AUTOMATED)
Bilirubin, UA: NEGATIVE
Blood, UA: 10
GLUCOSE UA: NEGATIVE
Ketones, UA: NEGATIVE
NITRITE UA: POSITIVE
PROTEIN UA: NEGATIVE
Spec Grav, UA: 1.015 (ref 1.010–1.025)
UROBILINOGEN UA: 0.2 U/dL
pH, UA: 7 (ref 5.0–8.0)

## 2017-12-25 MED ORDER — SULFAMETHOXAZOLE-TRIMETHOPRIM 800-160 MG PO TABS: 1.0000 | ORAL_TABLET | Freq: Two times a day (BID) | ORAL | 0 refills | Status: DC

## 2017-12-25 NOTE — Progress Notes (Signed)
Denise Fuller , 16-Jul-1935, 82 y.o., female MRN: [id number] Patient Care Team    Relationship Specialty Notifications Start End  Ma Hillock, DO PCP - General Family Medicine  01/10/16   Otelia Sergeant, OD Referring Physician   01/10/16    Comment: ophth- macular degeneration  [facility]  04/26/17     Chief Complaint  Patient presents with  . Urinary Frequency    flank pain     Subjective: Pt presents for an OV with complaints of UTI symptoms of 1 week duration.  Associated symptoms include flank pain an urinary frequency. She denies fever, chills, nausea, vomit or low back pain.  She has been + klebsiella pneumonia and E.coli. Bactrim sensitive on both and she feels that med works best.   Depression screen [facility] 2/9 04/26/2017 04/23/2016 01/10/2016  Decreased Interest 0 0 0  Down, Depressed, Hopeless 0 0 0  PHQ - 2 Score 0 0 0    Allergies  Allergen Reactions  . Doxycycline     GI upset  . Keflex [Cephalexin] Hives and Itching   Social History   Tobacco Use  . Smoking status: Never Smoker  . Smokeless tobacco: Never Used  Substance Use Topics  . Alcohol use: No   Past Medical History:  Diagnosis Date  . Bartonella infection   . BCC (basal cell carcinoma of skin) 1997   nose  . Chickenpox   . Chronic venous insufficiency   . Diverticulosis   . Elevated alkaline phosphatase level   . Elevated LDH   . Environmental allergies   . GAD (generalized anxiety disorder)   . Groin abscess    culture negative, doxy resolved abscess  . Hepatitis A   . Hyperlipidemia   . Hypertension   . Hypothyroidism   . Lymphadenopathy, cervical    chronic per pt. (left)  . Macular degeneration   . Meniere disease   . Thyroid disease    Past Surgical History:  Procedure Laterality Date  . ABDOMINAL HYSTERECTOMY  1991  . APPENDECTOMY  1967  . BASAL CELL CARCINOMA EXCISION  1997   nose  . BLEPHAROPLASTY    . CATARACT EXTRACTION W/ INTRAOCULAR LENS IMPLANT  Bilateral Oct and Nov 2018  . CHOLECYSTECTOMY  1967   Family History  Problem Relation Age of Onset  . Arthritis Mother   . Heart disease Mother   . Arthritis Father   . Aneurysm Father 64       brain  . Thyroid disease Daughter    Allergies as of 12/25/2017      Reactions   Doxycycline    GI upset   Keflex [cephalexin] Hives, Itching      Medication List        Accurate as of 12/25/17 11:13 AM. Always use your most recent med list.          aspirin EC 81 MG tablet Take 81 mg by mouth daily.   Biotin 5000 MCG Tabs Take by mouth.   calcium citrate-vitamin D 315-200 MG-UNIT tablet Commonly known as:  CITRACAL+D Take 1 tablet by mouth 2 (two) times daily.   cetirizine 10 MG tablet Commonly known as:  ZYRTEC Take 10 mg by mouth daily.   co-enzyme Q-10 30 MG capsule Take 30 mg by mouth 3 (three) times daily.   Cranberry 500 MG Tabs Take 1 tablet by mouth 2 (two) times daily.   cyclobenzaprine 5 MG tablet Commonly known as:  FLEXERIL Take  1 tablet (5 mg total) by mouth at bedtime.   DUREZOL 0.05 % Emul Generic drug:  Difluprednate INSTILL 1 DROP INTO LEFT EYE 3 TIMES A DAY   FLAX SEED OIL PO Take 1,300 mg by mouth 2 (two) times daily.   glucosamine-chondroitin 500-400 MG tablet Take 1 tablet by mouth 3 (three) times daily.   levothyroxine 125 MCG tablet Commonly known as:  SYNTHROID, LEVOTHROID Take 1 tablet (125 mcg total) by mouth daily.   lisinopril 5 MG tablet Commonly known as:  PRINIVIL,ZESTRIL Take 1 tablet (5 mg total) by mouth daily. Needs office visit prior to anymore refills   OMEGA-3 COMPLEX PO Take 360 mg by mouth daily.   polyethylene glycol packet Commonly known as:  MIRALAX / GLYCOLAX Take 17 g by mouth daily.   PROLENSA 0.07 % Soln Generic drug:  Bromfenac Sodium PUT 1 DROP INTO LEFT EYE AT BEDTIME   SYSTANE OP Apply to eye.   traMADol 50 MG tablet Commonly known as:  ULTRAM Take 1 tablet (50 mg total) by mouth every 6  (six) hours as needed.   traZODone 50 MG tablet Commonly known as:  DESYREL Take 0.5-1 tablets (25-50 mg total) by mouth at bedtime as needed for sleep.   triamterene-hydrochlorothiazide 37.5-25 MG capsule Commonly known as:  DYAZIDE Take 1 each (1 capsule total) by mouth daily. Needs office visit prior to any additional refills   Vitamin D3 25 MCG (1000 UT) Caps Take by mouth.       All past medical history, surgical history, allergies, family history, immunizations andmedications were updated in the EMR today and reviewed under the history and medication portions of their EMR.     ROS: Negative, with the exception of above mentioned in HPI   Objective:  BP 103/72 (BP Location: Right Arm, Patient Position: Sitting, Cuff Size: Normal)   Pulse 77   Temp (!) 97.5 F (36.4 C)   Resp 20   Ht 5' 4.5" (1.638 m)   Wt 167 lb (75.8 kg)   SpO2 96%   BMI 28.22 kg/m  Body mass index is 28.22 kg/m. Gen: Afebrile. No acute distress. Nontoxic in appearance, well developed, well nourished.  HENT: AT. [place].  MMM Eyes:Pupils Equal Round Reactive to light, Extraocular movements intact,  Conjunctiva without redness, discharge or icterus. CV: RRR  Chest: CTAB Abd: Soft. NTND. BS present MSK: No CVA tenderness Neuro: Normal gait. PERLA. EOMi. Alert. Oriented x3 Psych: Normal affect, dress and demeanor. Normal speech. Normal thought content and judgment.  No exam data present No results found. Results for orders placed or performed in visit on 12/25/17 (from the past 24 hour(s))  POCT Urinalysis Dipstick (Automated)     Status: Abnormal   Collection Time: 12/25/17 10:54 AM  Result Value Ref Range   Color, UA dark yellow    Clarity, UA cloudy    Glucose, UA Negative Negative   Bilirubin, UA negative    Ketones, UA negative    Spec Grav, UA 1.015 1.010 - 1.025   Blood, UA 10    pH, UA 7.0 5.0 - 8.0   Protein, UA Negative Negative   Urobilinogen, UA 0.2 0.2 or 1.0 E.U./dL   Nitrite,  UA positive    Leukocytes, UA Large (3+) (A) Negative    Assessment/Plan: Denise Fuller is a 82 y.o. female present for OV for  Urinary frequency - POCT Urinalysis Dipstick (Automated): + leuks, nitrites, RBC Abnormal urine finding Rest, hydrate.  Bactrim BID prescribed.  -  Urine Culture - F/U PRN   Reviewed expectations re: course of current medical issues.  Discussed self-management of symptoms.  Outlined signs and symptoms indicating need for more acute intervention.  Patient verbalized understanding and all questions were answered.  Patient received an After-Visit Summary.    Orders Placed This Encounter  Procedures  . Urine Culture  . POCT Urinalysis Dipstick (Automated)     Note is dictated utilizing voice recognition software. Although note has been proof read prior to signing, occasional typographical errors still can be missed. If any questions arise, please do not hesitate to call for verification.   electronically signed by:  Howard Pouch, DO  [place]

## 2017-12-25 NOTE — Patient Instructions (Signed)
Rest, HYDRATE (water). Bactrim prescribed, take every 12 hrs until completed.  We will send it for culture and call you with results once received.    Urinary Tract Infection, Adult A urinary tract infection (UTI) is an infection of any part of the urinary tract. The urinary tract includes the:  Kidneys.  Ureters.  Bladder.  Urethra.  These organs make, store, and get rid of pee (urine) in the body. Follow these instructions at home:  Take over-the-counter and prescription medicines only as told by your doctor.  If you were prescribed an antibiotic medicine, take it as told by your doctor. Do not stop taking the antibiotic even if you start to feel better.  Avoid the following drinks: ? Alcohol. ? Caffeine. ? Tea. ? Carbonated drinks.  Drink enough fluid to keep your pee clear or pale yellow.  Keep all follow-up visits as told by your doctor. This is important.  Make sure to: ? Empty your bladder often and completely. Do not to hold pee for long periods of time. ? Empty your bladder before and after sex. ? Wipe from front to back after a bowel movement if you are female. Use each tissue one time when you wipe. Contact a doctor if:  You have back pain.  You have a fever.  You feel sick to your stomach (nauseous).  You throw up (vomit).  Your symptoms do not get better after 3 days.  Your symptoms go away and then come back. Get help right away if:  You have very bad back pain.  You have very bad lower belly (abdominal) pain.  You are throwing up and cannot keep down any medicines or water. This information is not intended to replace advice given to you by your health care provider. Make sure you discuss any questions you have with your health care provider. Document Released: 07/11/2007 Document Revised: 06/30/2015 Document Reviewed: 12/13/2014 Elsevier Interactive Patient Education  [employer].

## 2017-12-26 MED ORDER — LISINOPRIL 5 MG PO TABS: 5.0000 mg | ORAL_TABLET | Freq: Every day | ORAL | 0 refills | Status: DC

## 2017-12-26 MED ORDER — TRIAMTERENE-HCTZ 37.5-25 MG PO CAPS: 1.0000 | ORAL_CAPSULE | Freq: Every day | ORAL | 0 refills | Status: DC

## 2017-12-27 LAB — URINE CULTURE
MICRO NUMBER:: 91399315
SPECIMEN QUALITY:: ADEQUATE

## 2017-12-31 ENCOUNTER — Ambulatory Visit (INDEPENDENT_AMBULATORY_CARE_PROVIDER_SITE_OTHER): Payer: Medicare HMO | Admitting: Family Medicine

## 2017-12-31 ENCOUNTER — Encounter: Payer: Self-pay | Admitting: Family Medicine

## 2017-12-31 VITALS — BP 108/75 | HR 79 | Temp 98.3°F | Resp 16 | Ht 64.5 in | Wt 153.0 lb

## 2017-12-31 DIAGNOSIS — M546 Pain in thoracic spine: Secondary | ICD-10-CM

## 2017-12-31 MED ORDER — NAPROXEN 500 MG PO TABS: 500.0000 mg | ORAL_TABLET | Freq: Two times a day (BID) | ORAL | 0 refills | Status: DC | PRN

## 2017-12-31 NOTE — Progress Notes (Signed)
Denise Fuller , 03-01-1935, 82 y.o., female MRN: [id number] Patient Care Team    Relationship Specialty Notifications Start End  Denise Hillock, DO PCP - General Family Medicine  01/10/16   Denise Fuller, OD Referring Physician   01/10/16    Comment: ophth- macular degeneration  Denise Fuller  Dentistry  04/26/17     Chief Complaint  Patient presents with  . Back Pain    left side back pain      Subjective: Pt presents for an OV with complaints of left thoracic back pain of 2 days duration.  Associated symptoms include left upper back pain that is worse with looking over her left shoulder, raisining her left arm and when taking a deep breath. She denies recent travel, fever, chills, Upper resp symptoms, nausea, vomit or shortness of breath. She is eating and drinking well. She is recovering from a klebs pneumonia UTI, finishing her bactrim. Feels much improved from UTI standpoint. She denies recent increase in activity, alcohol use. She has had a h/o GB stones and GB removal. She denies h/o kidney stones.  Pt has tried nothing to ease their symptoms.   Depression screen [facility] 2/9 04/26/2017 04/23/2016 01/10/2016  Decreased Interest 0 0 0  Down, Depressed, Hopeless 0 0 0  PHQ - 2 Score 0 0 0    Allergies  Allergen Reactions  . Doxycycline     GI upset  . Keflex [Cephalexin] Hives and Itching   Social History   Tobacco Use  . Smoking status: Never Smoker  . Smokeless tobacco: Never Used  Substance Use Topics  . Alcohol use: No   Past Medical History:  Diagnosis Date  . Bartonella infection   . BCC (basal cell carcinoma of skin) 1997   nose  . Chickenpox   . Chronic venous insufficiency   . Diverticulosis   . Elevated alkaline phosphatase level   . Elevated LDH   . Environmental allergies   . GAD (generalized anxiety disorder)   . Groin abscess    culture negative, doxy resolved abscess  . Hepatitis A   . Hyperlipidemia   . Hypertension   . Hypothyroidism   .  Lymphadenopathy, cervical    chronic per pt. (left)  . Macular degeneration   . Meniere disease   . Thyroid disease    Past Surgical History:  Procedure Laterality Date  . ABDOMINAL HYSTERECTOMY  1991  . APPENDECTOMY  1967  . BASAL CELL CARCINOMA EXCISION  1997   nose  . BLEPHAROPLASTY    . CATARACT EXTRACTION W/ INTRAOCULAR LENS IMPLANT Bilateral Oct and Nov 2018  . CHOLECYSTECTOMY  1967   Family History  Problem Relation Age of Onset  . Arthritis Mother   . Heart disease Mother   . Arthritis Father   . Aneurysm Father 3       brain  . Thyroid disease Daughter    Allergies as of 12/31/2017      Reactions   Doxycycline    GI upset   Keflex [cephalexin] Hives, Itching      Medication List        Accurate as of 12/31/17  9:29 AM. Always use your most recent med list.          aspirin EC 81 MG tablet Take 81 mg by mouth daily.   Biotin 5000 MCG Tabs Take by mouth.   calcium citrate-vitamin D 315-200 MG-UNIT tablet Commonly known as:  CITRACAL+D Take 1 tablet by mouth  2 (two) times daily.   cetirizine 10 MG tablet Commonly known as:  ZYRTEC Take 10 mg by mouth daily.   co-enzyme Q-10 30 MG capsule Take 30 mg by mouth 3 (three) times daily.   Cranberry 500 MG Tabs Take 1 tablet by mouth 2 (two) times daily.   cyclobenzaprine 5 MG tablet Commonly known as:  FLEXERIL Take 1 tablet (5 mg total) by mouth at bedtime.   DUREZOL 0.05 % Emul Generic drug:  Difluprednate INSTILL 1 DROP INTO LEFT EYE 3 TIMES A DAY   FLAX SEED OIL PO Take 1,300 mg by mouth 2 (two) times daily.   glucosamine-chondroitin 500-400 MG tablet Take 1 tablet by mouth 3 (three) times daily.   levothyroxine 125 MCG tablet Commonly known as:  SYNTHROID, LEVOTHROID Take 1 tablet (125 mcg total) by mouth daily.   lisinopril 5 MG tablet Commonly known as:  PRINIVIL,ZESTRIL Take 1 tablet (5 mg total) by mouth daily. Needs office visit prior to anymore refills   OMEGA-3 COMPLEX  PO Take 360 mg by mouth daily.   polyethylene glycol packet Commonly known as:  MIRALAX / GLYCOLAX Take 17 g by mouth daily.   PROLENSA 0.07 % Soln Generic drug:  Bromfenac Sodium PUT 1 DROP INTO LEFT EYE AT BEDTIME   sulfamethoxazole-trimethoprim 800-160 MG tablet Commonly known as:  BACTRIM DS,SEPTRA DS Take 1 tablet by mouth 2 (two) times daily.   SYSTANE OP Apply to eye.   traMADol 50 MG tablet Commonly known as:  ULTRAM Take 1 tablet (50 mg total) by mouth every 6 (six) hours as needed.   traZODone 50 MG tablet Commonly known as:  DESYREL Take 0.5-1 tablets (25-50 mg total) by mouth at bedtime as needed for sleep.   triamterene-hydrochlorothiazide 37.5-25 MG capsule Commonly known as:  DYAZIDE Take 1 each (1 capsule total) by mouth daily. Needs office visit prior to any additional refills   Vitamin D3 25 MCG (1000 UT) Caps Take by mouth.       All past medical history, surgical history, allergies, family history, immunizations andmedications were updated in the EMR today and reviewed under the history and medication portions of their EMR.     ROS: Negative, with the exception of above mentioned in HPI   Objective:  BP 108/75 (BP Location: Left Arm, Patient Position: Sitting, Cuff Size: Normal)   Pulse 79   Temp 98.3 F (36.8 C) (Oral)   Resp 16   Ht 5' 4.5" (1.638 m)   Wt 153 lb (69.4 kg)   SpO2 97%   BMI 25.86 kg/m  Body mass index is 25.86 kg/m. Gen: Afebrile. No acute distress. Nontoxic in appearance, well developed, well nourished.  HENT: AT. [place]. MMM Eyes:Pupils Equal Round Reactive to light, Extraocular movements intact,  Conjunctiva without redness, discharge or icterus. Neck/lymp/endocrine: Supple,no lymphadenopathy CV: RRR  Chest: CTAB, no wheeze or crackles. Good air movement, normal resp effort.  Abd: Soft. NTND. BS present. no Masses palpated. No rebound or guarding.  MSK: no cva tenderness. No spine or rib tenderness, trapezium and  rhomboid ropiness and tightness.  Skin: no rashes, purpura or petechiae.  Psych: Normal affect, dress and demeanor. Normal speech. Normal thought content and judgment.  No exam data present No results found. No results found for this or any previous visit (from the past 24 hour(s)).  Assessment/Plan: FALYNN AILEY is a 82 y.o. female present for OV for  Acute left-sided thoracic back pain - No red flags on exam. Consider  pleurisy and pancreatic cause of pain- exam more consistent with MSK and reproducible with movement.  - rest, heating pad, massage, naproxen prescribed. - Use flexeril at night.  - F/U 2 weeks, sooner if worsening. If still present would image and consider CMP/lipase.    Reviewed expectations re: course of current medical issues.  Discussed self-management of symptoms.  Outlined signs and symptoms indicating need for more acute intervention.  Patient verbalized understanding and all questions were answered.  Patient received an After-Visit Summary.    No orders of the defined types were placed in this encounter.    Note is dictated utilizing voice recognition software. Although note has been proof read prior to signing, occasional typographical errors still can be missed. If any questions arise, please do not hesitate to call for verification.   electronically signed by:  Howard Pouch, DO  [place]

## 2017-12-31 NOTE — Patient Instructions (Signed)
I believe this musculoskeletal in nature.  Rest, heating pad, naproxen once a day with food- can take every 12 hours if needed, use the flexeril (muscle relaxer)  before bed.   If it would happen to be pleurisy the naproxen will help with it as well.   If you are not seeing improvement in 2 weeks then please be seen or if you are getting worse- having shortness of breath- then be seen immediately.     Muscle Strain A muscle strain (pulled muscle) happens when a muscle is stretched beyond normal length. It happens when a sudden, violent force stretches your muscle too far. Usually, a few of the fibers in your muscle are torn. Muscle strain is common in athletes. Recovery usually takes 1-2 weeks. Complete healing takes 5-6 weeks. Follow these instructions at home:  Follow the PRICE method of treatment to help your injury get better. Do this the first 2-3 days after the injury: ? Protect. Protect the muscle to keep it from getting injured again. ? Rest. Limit your activity and rest the injured body part. ? Ice. Put ice in a plastic bag. Place a towel between your skin and the bag. Then, apply the ice and leave it on from 15-20 minutes each hour. After the third day, switch to moist heat packs. ? Compression. Use a splint or elastic bandage on the injured area for comfort. Do not put it on too tightly. ? Elevate. Keep the injured body part above the level of your heart.  Only take medicine as told by your doctor.  Warm up before doing exercise to prevent future muscle strains. Contact a doctor if:  You have more pain or puffiness (swelling) in the injured area.  You feel numbness, tingling, or notice a loss of strength in the injured area. This information is not intended to replace advice given to you by your health care provider. Make sure you discuss any questions you have with your health care provider. Document Released: 11/01/2007 Document Revised: 06/30/2015 Document Reviewed:  08/21/2012 Elsevier Interactive Patient Education  2017 [employer].

## 2018-01-20 ENCOUNTER — Other Ambulatory Visit: Payer: Self-pay | Admitting: Family Medicine

## 2018-01-20 DIAGNOSIS — I10 Essential (primary) hypertension: Secondary | ICD-10-CM

## 2018-02-19 DIAGNOSIS — H353132 Nonexudative age-related macular degeneration, bilateral, intermediate dry stage: Secondary | ICD-10-CM | POA: Diagnosis not present

## 2018-03-03 ENCOUNTER — Other Ambulatory Visit: Payer: Self-pay | Admitting: Family Medicine

## 2018-03-03 DIAGNOSIS — I10 Essential (primary) hypertension: Secondary | ICD-10-CM

## 2018-03-29 ENCOUNTER — Other Ambulatory Visit: Payer: Self-pay | Admitting: Family Medicine

## 2018-03-29 DIAGNOSIS — I10 Essential (primary) hypertension: Secondary | ICD-10-CM

## 2018-04-03 ENCOUNTER — Other Ambulatory Visit: Payer: Self-pay | Admitting: Family Medicine

## 2018-04-03 DIAGNOSIS — I10 Essential (primary) hypertension: Secondary | ICD-10-CM

## 2018-04-03 MED ORDER — TRIAMTERENE-HCTZ 37.5-25 MG PO CAPS: ORAL_CAPSULE | ORAL | 0 refills | Status: DC

## 2018-04-03 MED ORDER — LISINOPRIL 5 MG PO TABS: ORAL_TABLET | ORAL | 0 refills | Status: DC

## 2018-04-03 NOTE — Telephone Encounter (Signed)
Pt has appt 04/10/2018

## 2018-04-03 NOTE — Telephone Encounter (Signed)
Copied from [place] [id number]. Topic: Quick Communication - Rx Refill/Question >> Apr 03, 2018 10:42 AM Virl Axe D wrote: Medication: lisinopril (PRINIVIL,ZESTRIL) 5 MG tablet / triamterene-hydrochlorothiazide (DYAZIDE) 37.5-25 MG capsule / Pt has scheduled OV on 04/10/10, Dr. Lucita Lora first available opening.  Has the patient contacted their pharmacy? Yes.   (Agent: If no, request that the patient contact the pharmacy for the refill.) (Agent: If yes, when and what did the pharmacy advise?)  Preferred Pharmacy (with phone number or street name): [facility], [place] [phone] (Phone) [phone] (Fax)  Agent: Please be advised that RX refills may take up to 3 business days. We ask that you follow-up with your pharmacy.

## 2018-04-10 ENCOUNTER — Ambulatory Visit (INDEPENDENT_AMBULATORY_CARE_PROVIDER_SITE_OTHER): Payer: Medicare HMO | Admitting: Family Medicine

## 2018-04-10 ENCOUNTER — Encounter: Payer: Self-pay | Admitting: Family Medicine

## 2018-04-10 VITALS — BP 105/72 | HR 70 | Temp 97.8°F | Resp 16 | Ht 65.0 in | Wt 151.5 lb

## 2018-04-10 DIAGNOSIS — L600 Ingrowing nail: Secondary | ICD-10-CM

## 2018-04-10 DIAGNOSIS — F411 Generalized anxiety disorder: Secondary | ICD-10-CM | POA: Diagnosis not present

## 2018-04-10 DIAGNOSIS — I10 Essential (primary) hypertension: Secondary | ICD-10-CM

## 2018-04-10 DIAGNOSIS — E663 Overweight: Secondary | ICD-10-CM | POA: Insufficient documentation

## 2018-04-10 DIAGNOSIS — E785 Hyperlipidemia, unspecified: Secondary | ICD-10-CM

## 2018-04-10 DIAGNOSIS — E039 Hypothyroidism, unspecified: Secondary | ICD-10-CM

## 2018-04-10 HISTORY — DX: Ingrowing nail: L60.0

## 2018-04-10 HISTORY — DX: Overweight: E66.3

## 2018-04-10 MED ORDER — LEVOTHYROXINE SODIUM 125 MCG PO TABS: 125.0000 ug | ORAL_TABLET | Freq: Every day | ORAL | 1 refills | Status: DC

## 2018-04-10 MED ORDER — TRIAMTERENE-HCTZ 37.5-25 MG PO CAPS: ORAL_CAPSULE | ORAL | 1 refills | Status: DC

## 2018-04-10 MED ORDER — TRAZODONE HCL 50 MG PO TABS: 25.0000 mg | ORAL_TABLET | Freq: Every evening | ORAL | 1 refills | Status: DC | PRN

## 2018-04-10 MED ORDER — LISINOPRIL 5 MG PO TABS: ORAL_TABLET | ORAL | 1 refills | Status: DC

## 2018-04-10 NOTE — Progress Notes (Signed)
Denise Fuller , 03/18/1935, 83 y.o., female MRN: [id number] Patient Care Team    Relationship Specialty Notifications Start End  Ma Hillock, DO PCP - General Family Medicine  01/10/16   Otelia Sergeant, OD Referring Physician   01/10/16    Comment: ophth- macular degeneration  [place]  Dentistry  04/26/17     Chief Complaint  Patient presents with  . Hypertension  . Anxiety  . Ingrown Toenail    Pt is having a difficult time cutting her own toenails and R big toe is red and hurting      Subjective:   Hypertension/HLD:  Pt reports compliance with lisinopril 5 mg daily and Dyazide 37.5-25 milligrams daily. Blood pressures ranges at home WNL. Patient denies chest pain, shortness of breath, dizziness or lower extremity edema.  Pt takes a daily baby ASA. Pt is not prescribed statin. BMP: 08/2017 within normal limits CBC: 08/2017 within normal limits Lipids:04/2017  Total cholesterol 256, LDL 161, HDL 67, triglycerides 136 Diet: Low-sodium Exercise: Routine exercise- she is now walking again.  RF: Hypertension, hyperlipidemia, family history heart disease  Hypothyroidism/fatigue: Patient reports compliance with levothyroxine 125 g daily. She denies any flushing, palpitations, diarrhea or constipation. 06/2017 TSH WNL  Generalized anxiety disorder: trazodone daily at bedtime. Doing well on that.  Ingrown toenail:  Right large toe with mild tenderness and long nails bilateral feet. Pt states she is unable to reach her feet now to trim her toes. She denies redness or drainage. Started a few weeks ago.   Depression screen [facility] 2/9 04/26/2017 04/23/2016 01/10/2016  Decreased Interest 0 0 0  Down, Depressed, Hopeless 0 0 0  PHQ - 2 Score 0 0 0    Allergies  Allergen Reactions  . Doxycycline     GI upset  . Keflex [Cephalexin] Hives and Itching   Social History   Tobacco Use  . Smoking status: Never Smoker  . Smokeless tobacco: Never Used  Substance Use Topics  .  Alcohol use: No   Past Medical History:  Diagnosis Date  . Bartonella infection   . BCC (basal cell carcinoma of skin) 1997   nose  . Chickenpox   . Chronic venous insufficiency   . Diverticulosis   . Elevated alkaline phosphatase level   . Elevated LDH   . Environmental allergies   . GAD (generalized anxiety disorder)   . Groin abscess    culture negative, doxy resolved abscess  . Hepatitis A   . Hyperlipidemia   . Hypertension   . Hypothyroidism   . Lymphadenopathy, cervical    chronic per pt. (left)  . Macular degeneration   . Meniere disease   . Thyroid disease    Past Surgical History:  Procedure Laterality Date  . ABDOMINAL HYSTERECTOMY  1991  . APPENDECTOMY  1967  . BASAL CELL CARCINOMA EXCISION  1997   nose  . BLEPHAROPLASTY    . CATARACT EXTRACTION W/ INTRAOCULAR LENS IMPLANT Bilateral Oct and Nov 2018  . CHOLECYSTECTOMY  1967   Family History  Problem Relation Age of Onset  . Arthritis Mother   . Heart disease Mother   . Arthritis Father   . Aneurysm Father 82       brain  . Thyroid disease Daughter    Allergies as of 04/10/2018      Reactions   Doxycycline    GI upset   Keflex [cephalexin] Hives, Itching      Medication List  Accurate as of April 10, 2018  9:45 AM. Always use your most recent med list.        aspirin EC 81 MG tablet Take 81 mg by mouth daily.   Biotin 5000 MCG Tabs Take by mouth.   calcium citrate-vitamin D 315-200 MG-UNIT tablet Commonly known as:  CITRACAL+D Take 1 tablet by mouth 2 (two) times daily.   cetirizine 10 MG tablet Commonly known as:  ZYRTEC Take 10 mg by mouth daily.   co-enzyme Q-10 30 MG capsule Take 30 mg by mouth 3 (three) times daily.   Cranberry 500 MG Tabs Take 1 tablet by mouth 2 (two) times daily.   cyclobenzaprine 5 MG tablet Commonly known as:  FLEXERIL Take 1 tablet (5 mg total) by mouth at bedtime.   DUREZOL 0.05 % Emul Generic drug:  Difluprednate INSTILL 1 DROP INTO LEFT  EYE 3 TIMES A DAY   FLAX SEED OIL PO Take 1,300 mg by mouth 2 (two) times daily.   glucosamine-chondroitin 500-400 MG tablet Take 1 tablet by mouth 3 (three) times daily.   levothyroxine 125 MCG tablet Commonly known as:  SYNTHROID, LEVOTHROID Take 1 tablet (125 mcg total) by mouth daily.   lisinopril 5 MG tablet Commonly known as:  PRINIVIL,ZESTRIL TAKE 1 TABLET DAILY. NEEDS OFFICE VISIT PRIOR TO ANYMORE REFILLS.   naproxen 500 MG tablet Commonly known as:  NAPROSYN Take 1 tablet (500 mg total) by mouth 2 (two) times daily as needed.   OMEGA-3 COMPLEX PO Take 360 mg by mouth daily.   polyethylene glycol packet Commonly known as:  MIRALAX / GLYCOLAX Take 17 g by mouth daily.   PROLENSA 0.07 % Soln Generic drug:  Bromfenac Sodium PUT 1 DROP INTO LEFT EYE AT BEDTIME   SYSTANE OP Apply to eye.   traZODone 50 MG tablet Commonly known as:  DESYREL Take 0.5-1 tablets (25-50 mg total) by mouth at bedtime as needed for sleep.   triamterene-hydrochlorothiazide 37.5-25 MG capsule Commonly known as:  DYAZIDE TAKE 1 CAPSULE DAILY. NEED OFFICE VISIT PRIOR TO ANY ADDITIONAL REFILLS   Vitamin D3 25 MCG (1000 UT) Caps Take by mouth.       All past medical history, surgical history, allergies, family history, immunizations andmedications were updated in the EMR today and reviewed under the history and medication portions of their EMR.     ROS: Negative, with the exception of above mentioned in HPI   Objective:  BP 105/72 (BP Location: Left Arm, Patient Position: Sitting, Cuff Size: Normal)   Pulse 70   Temp 97.8 F (36.6 C) (Oral)   Resp 16   Ht 5\' 5"  (1.651 m)   Wt 151 lb 8 oz (68.7 kg)   SpO2 98%   BMI 25.21 kg/m  Body mass index is 25.21 kg/m. Gen: Afebrile. No acute distress. Nontoxic in presentation. Pleasant caucasian female.  HENT: AT. [place]. MMM.  Eyes:Pupils Equal Round Reactive to light, Extraocular movements intact,  Conjunctiva without redness, discharge or  icterus. Neck/lymp/endocrine: Supple,no lymphadenopathy, no thyromegaly CV: RRR no murmur, no edema, +2/4 P posterior tibialis pulses Chest: CTAB, no wheeze or crackles MSK (right toe): no erythema, no soft tissue swelling, small amount of nail ingrown lateral toe nail. No drainage. Thickened long nails bilateral feet.  Neuro:  Normal gait. PERLA. EOMi. Alert. Oriented x3  Psych: Normal affect, dress and demeanor. Normal speech. Normal thought content and judgment.    No exam data present No results found. No results found for this or  any previous visit (from the past 24 hour(s)).  Assessment/Plan: Denise Fuller is a 83 y.o. female present for OV for  Essential hypertension/hyperlipidemia - Stable today. Continue lisinopril 5 mg daily and Dyazide. - low sodium, routine exercise.  - labs next visit.  - f/u 6 mos   Hypothyroidism, unspecified type/fatigue - refilled synthroid. - recheck levels next appt.   GAD (generalized anxiety disorder - Stable. Continue current regimen.  - traZODone (DESYREL) 50 MG tablet; Take 0.5-1 tablets (25-50 mg total) by mouth at bedtime as needed for sleep.  Dispense: 90 tablet; Refill: 1  Ingrown nail: Encourage routine nail care--> referred to podiatry since she can no longer trim her nails on her own.  - epson salt soak area. Apply small amount of cortisone cream. No picking area of concern. It is not infected at this time.    Reviewed expectations re: course of current medical issues.  Discussed self-management of symptoms.  Outlined signs and symptoms indicating need for more acute intervention.  Patient verbalized understanding and all questions were answered.  Patient received an After-Visit Summary.    No orders of the defined types were placed in this encounter.    Note is dictated utilizing voice recognition software. Although note has been proof read prior to signing, occasional typographical errors still can be missed. If any  questions arise, please do not hesitate to call for verification.   electronically signed by:  Howard Pouch, DO  [place]

## 2018-04-10 NOTE — Patient Instructions (Addendum)
In 6 months come fasting for your physical.  Referred you to podiatry to help with nail care.  Use a small amount of hydrocortisone cream over the area we discussed to soften. Try not to pick.   Please help [place] help you:  We are honored you have chosen [facility] for your Primary Care home. Below you will find basic instructions that you may need to access in the future. Please help [place] help you by reading the instructions, which cover many of the frequent questions we experience.   Prescription refills and request:  -In order to allow more efficient response time, please call your pharmacy for all refills. They will forward the request electronically to [place]. This allows for the quickest possible response. Request left on a nurse line can take longer to refill, since these are checked as time allows between office patients and other phone calls.  - refill request can take up to 3-5 working days to complete.  - If request is sent electronically and request is appropiate, it is usually completed in 1-2 business days.  - all patients will need to be seen routinely for all chronic medical conditions requiring prescription medications (see follow-up below). If you are overdue for follow up on your condition, you will be asked to make an appointment and we will call in enough medication to cover you until your appointment (up to 30 days).  - all controlled substances will require a face to face visit to request/refill.  - if you desire your prescriptions to go through a new pharmacy, and have an active script at original pharmacy, you will need to call your pharmacy and have scripts transferred to new pharmacy. This is completed between the pharmacy locations and not by your provider.    Results: If any images or labs were ordered, it can take up to 1 week to get results depending on the test ordered and the lab/facility running and resulting the test. - Normal or stable results, which do not need  further discussion, may be released to your mychart immediately with attached note to you. A call may not be generated for normal results. Please make certain to sign up for mychart. If you have questions on how to activate your mychart you can call the front office.  - If your results need further discussion, our office will attempt to contact you via phone, and if unable to reach you after 2 attempts, we will release your abnormal result to your mychart with instructions.  - All results will be automatically released in mychart after 1 week.  - Your provider will provide you with explanation and instruction on all relevant material in your results. Please keep in mind, results and labs may appear confusing or abnormal to the untrained eye, but it does not mean they are actually abnormal for you personally. If you have any questions about your results that are not covered, or you desire more detailed explanation than what was provided, you should make an appointment with your provider to do so.   Our office handles many outgoing and incoming calls daily. If we have not contacted you within 1 week about your results, please check your mychart to see if there is a message first and if not, then contact our office.  In helping with this matter, you help decrease call volume, and therefore allow [place] to be able to respond to patients needs more efficiently.   Acute office visits (sick visit):  An acute  visit is intended for a new problem and are scheduled in shorter time slots to allow schedule openings for patients with new problems. This is the appropriate visit to discuss a new problem. Problems will not be addressed by phone call or Echart message. Appointment is needed if requesting treatment. In order to provide you with excellent quality medical care with proper time for you to explain your problem, have an exam and receive treatment with instructions, these appointments should be limited to one new  problem per visit. If you experience a new problem, in which you desire to be addressed, please make an acute office visit, we save openings on the schedule to accommodate you. Please do not save your new problem for any other type of visit, let us take care of it properly and quickly for you.   Follow up visits:  Depending on your condition(s) your provider will need to see you routinely in order to provide you with quality care and prescribe medication(s). Most chronic conditions (Example: hypertension, Diabetes, depression/anxiety... etc), require visits a couple times a year. Your provider will instruct you on proper follow up for your personal medical conditions and history. Please make certain to make follow up appointments for your condition as instructed. Failing to do so could result in lapse in your medication treatment/refills. If you request a refill, and are overdue to be seen on a condition, we will always provide you with a 30 day script (once) to allow you time to schedule.    Medicare wellness (well visit): - we have a wonderful Nurse Maudie Mercury), that will meet with you and provide you will yearly medicare wellness visits. These visits should occur yearly (can not be scheduled less than 1 calendar year apart) and cover preventive health, immunizations, advance directives and screenings you are entitled to yearly through your medicare benefits. Do not miss out on your entitled benefits, this is when medicare will pay for these benefits to be ordered for you.  These are strongly encouraged by your provider and is the appropriate type of visit to make certain you are up to date with all preventive health benefits. If you have not had your medicare wellness exam in the last 12 months, please make certain to schedule one by calling the office and schedule your medicare wellness with Maudie Mercury as soon as possible.   Yearly physical (well visit):  - Adults are recommended to be seen yearly for physicals.  Check with your insurance and date of your last physical, most insurances require one calendar year between physicals. Physicals include all preventive health topics, screenings, medical exam and labs that are appropriate for gender/age and history. You may have fasting labs needed at this visit. This is a well visit (not a sick visit), new problems should not be covered during this visit (see acute visit).  - Pediatric patients are seen more frequently when they are younger. Your provider will advise you on well child visit timing that is appropriate for your their age. - This is not a medicare wellness visit. Medicare wellness exams do not have an exam portion to the visit. Some medicare companies allow for a physical, some do not allow a yearly physical. If your medicare allows a yearly physical you can schedule the medicare wellness with our nurse Maudie Mercury and have your physical with your provider after, on the same day. Please check with insurance for your full benefits.   Late Policy/No Shows:  - all new patients should arrive 15-30 minutes  earlier than appointment to allow [place] time  to  obtain all personal demographics,  insurance information and for you to complete office paperwork. - All established patients should arrive 10-15 minutes earlier than appointment time to update all information and be checked in .  - In our best efforts to run on time, if you are late for your appointment you will be asked to either reschedule or if able, we will work you back into the schedule. There will be a wait time to work you back in the schedule,  depending on availability.  - If you are unable to make it to your appointment as scheduled, please call 24 hours ahead of time to allow [place] to fill the time slot with someone else who needs to be seen. If you do not cancel your appointment ahead of time, you may be charged a no show fee.

## 2018-08-15 ENCOUNTER — Other Ambulatory Visit: Payer: Self-pay | Admitting: Family Medicine

## 2018-08-15 DIAGNOSIS — I10 Essential (primary) hypertension: Secondary | ICD-10-CM

## 2018-08-15 DIAGNOSIS — F411 Generalized anxiety disorder: Secondary | ICD-10-CM

## 2018-09-01 DIAGNOSIS — H35423 Microcystoid degeneration of retina, bilateral: Secondary | ICD-10-CM | POA: Diagnosis not present

## 2018-09-01 DIAGNOSIS — H43813 Vitreous degeneration, bilateral: Secondary | ICD-10-CM | POA: Diagnosis not present

## 2018-09-01 DIAGNOSIS — H353123 Nonexudative age-related macular degeneration, left eye, advanced atrophic without subfoveal involvement: Secondary | ICD-10-CM | POA: Diagnosis not present

## 2018-09-01 DIAGNOSIS — H353112 Nonexudative age-related macular degeneration, right eye, intermediate dry stage: Secondary | ICD-10-CM | POA: Diagnosis not present

## 2018-10-01 DIAGNOSIS — H26491 Other secondary cataract, right eye: Secondary | ICD-10-CM | POA: Diagnosis not present

## 2018-10-01 DIAGNOSIS — H353132 Nonexudative age-related macular degeneration, bilateral, intermediate dry stage: Secondary | ICD-10-CM | POA: Diagnosis not present

## 2018-10-15 ENCOUNTER — Encounter: Payer: Self-pay | Admitting: Family Medicine

## 2018-10-15 ENCOUNTER — Ambulatory Visit (INDEPENDENT_AMBULATORY_CARE_PROVIDER_SITE_OTHER): Payer: Medicare HMO | Admitting: Family Medicine

## 2018-10-15 ENCOUNTER — Other Ambulatory Visit: Payer: Self-pay

## 2018-10-15 VITALS — BP 114/72 | HR 68 | Temp 97.8°F | Resp 16 | Ht 65.0 in | Wt 161.2 lb

## 2018-10-15 DIAGNOSIS — I10 Essential (primary) hypertension: Secondary | ICD-10-CM | POA: Diagnosis not present

## 2018-10-15 DIAGNOSIS — E785 Hyperlipidemia, unspecified: Secondary | ICD-10-CM

## 2018-10-15 DIAGNOSIS — F411 Generalized anxiety disorder: Secondary | ICD-10-CM

## 2018-10-15 DIAGNOSIS — Z23 Encounter for immunization: Secondary | ICD-10-CM

## 2018-10-15 DIAGNOSIS — Z0000 Encounter for general adult medical examination without abnormal findings: Secondary | ICD-10-CM | POA: Diagnosis not present

## 2018-10-15 DIAGNOSIS — E663 Overweight: Secondary | ICD-10-CM

## 2018-10-15 DIAGNOSIS — Z131 Encounter for screening for diabetes mellitus: Secondary | ICD-10-CM

## 2018-10-15 DIAGNOSIS — E039 Hypothyroidism, unspecified: Secondary | ICD-10-CM | POA: Diagnosis not present

## 2018-10-15 LAB — CBC
HCT: 38.7 % (ref 36.0–46.0)
Hemoglobin: 12.8 g/dL (ref 12.0–15.0)
MCHC: 32.9 g/dL (ref 30.0–36.0)
MCV: 90.4 fl (ref 78.0–100.0)
Platelets: 272 10*3/uL (ref 150.0–400.0)
RBC: 4.29 Mil/uL (ref 3.87–5.11)
RDW: 13.7 % (ref 11.5–15.5)
WBC: 6.5 10*3/uL (ref 4.0–10.5)

## 2018-10-15 LAB — COMPREHENSIVE METABOLIC PANEL
ALT: 18 U/L (ref 0–35)
AST: 17 U/L (ref 0–37)
Albumin: 3.8 g/dL (ref 3.5–5.2)
Alkaline Phosphatase: 164 U/L — ABNORMAL HIGH (ref 39–117)
BUN: 13 mg/dL (ref 6–23)
CO2: 28 mEq/L (ref 19–32)
Calcium: 9 mg/dL (ref 8.4–10.5)
Chloride: 100 mEq/L (ref 96–112)
Creatinine, Ser: 0.62 mg/dL (ref 0.40–1.20)
GFR: 92.42 mL/min (ref >60.00)
Glucose, Bld: 87 mg/dL (ref 70–99)
Potassium: 3.6 mEq/L (ref 3.5–5.1)
Sodium: 136 mEq/L (ref 135–145)
Total Bilirubin: 0.5 mg/dL (ref 0.2–1.2)
Total Protein: 6.2 g/dL (ref 6.0–8.3)

## 2018-10-15 LAB — LIPID PANEL
Cholesterol: 197 mg/dL (ref 0–200)
HDL: 64.6 mg/dL (ref >39.00)
LDL Cholesterol: 111 mg/dL — ABNORMAL HIGH (ref 0–99)
NonHDL: 132.87
Total CHOL/HDL Ratio: 3
Triglycerides: 108 mg/dL (ref 0.0–149.0)
VLDL: 21.6 mg/dL (ref 0.0–40.0)

## 2018-10-15 LAB — HEMOGLOBIN A1C: Hgb A1c MFr Bld: 5.5 % (ref 4.6–6.5)

## 2018-10-15 LAB — TSH: TSH: 0.06 u[IU]/mL — ABNORMAL LOW (ref 0.35–4.50)

## 2018-10-15 MED ORDER — LISINOPRIL 5 MG PO TABS: 5.0000 mg | ORAL_TABLET | Freq: Every day | ORAL | 1 refills | Status: DC

## 2018-10-15 MED ORDER — TRIAMTERENE-HCTZ 37.5-25 MG PO CAPS: 1.0000 | ORAL_CAPSULE | Freq: Every day | ORAL | 1 refills | Status: DC

## 2018-10-15 NOTE — Patient Instructions (Signed)
Health Maintenance, Female Adopting a healthy lifestyle and getting preventive care are important in promoting health and wellness. Ask your health care provider about:  The right schedule for you to have regular tests and exams.  Things you can do on your own to prevent diseases and keep yourself healthy. What should I know about diet, weight, and exercise? Eat a healthy diet   Eat a diet that includes plenty of vegetables, fruits, low-fat dairy products, and lean protein.  Do not eat a lot of foods that are high in solid fats, added sugars, or sodium. Maintain a healthy weight Body mass index (BMI) is used to identify weight problems. It estimates body fat based on height and weight. Your health care provider can help determine your BMI and help you achieve or maintain a healthy weight. Get regular exercise Get regular exercise. This is one of the most important things you can do for your health. Most adults should:  Exercise for at least 150 minutes each week. The exercise should increase your heart rate and make you sweat (moderate-intensity exercise).  Do strengthening exercises at least twice a week. This is in addition to the moderate-intensity exercise.  Spend less time sitting. Even light physical activity can be beneficial. Watch cholesterol and blood lipids Have your blood tested for lipids and cholesterol at 81 years of age, then have this test every 5 years. Have your cholesterol levels checked more often if:  Your lipid or cholesterol levels are high.  You are older than 81 years of age.  You are at high risk for heart disease. What should I know about cancer screening? Depending on your health history and family history, you may need to have cancer screening at various ages. This may include screening for:  Breast cancer.  Cervical cancer.  Colorectal cancer.  Skin cancer.  Lung cancer. What should I know about heart disease, diabetes, and high blood  pressure? Blood pressure and heart disease  High blood pressure causes heart disease and increases the risk of stroke. This is more likely to develop in people who have high blood pressure readings, are of African descent, or are overweight.  Have your blood pressure checked: ? Every 3-5 years if you are 18-39 years of age. ? Every year if you are 40 years old or older. Diabetes Have regular diabetes screenings. This checks your fasting blood sugar level. Have the screening done:  Once every three years after age 40 if you are at a normal weight and have a low risk for diabetes.  More often and at a younger age if you are overweight or have a high risk for diabetes. What should I know about preventing infection? Hepatitis B If you have a higher risk for hepatitis B, you should be screened for this virus. Talk with your health care provider to find out if you are at risk for hepatitis B infection. Hepatitis C Testing is recommended for:  Everyone born from 1945 through 1965.  Anyone with known risk factors for hepatitis C. Sexually transmitted infections (STIs)  Get screened for STIs, including gonorrhea and chlamydia, if: ? You are sexually active and are younger than 81 years of age. ? You are older than 81 years of age and your health care provider tells you that you are at risk for this type of infection. ? Your sexual activity has changed since you were last screened, and you are at increased risk for chlamydia or gonorrhea. Ask your health care provider if   you are at risk.  Ask your health care provider about whether you are at high risk for HIV. Your health care provider may recommend a prescription medicine to help prevent HIV infection. If you choose to take medicine to prevent HIV, you should first get tested for HIV. You should then be tested every 3 months for as long as you are taking the medicine. Pregnancy  If you are about to stop having your period (premenopausal) and  you may become pregnant, seek counseling before you get pregnant.  Take 400 to 800 micrograms (mcg) of folic acid every day if you become pregnant.  Ask for birth control (contraception) if you want to prevent pregnancy. Osteoporosis and menopause Osteoporosis is a disease in which the bones lose minerals and strength with aging. This can result in bone fractures. If you are 65 years old or older, or if you are at risk for osteoporosis and fractures, ask your health care provider if you should:  Be screened for bone loss.  Take a calcium or vitamin D supplement to lower your risk of fractures.  Be given hormone replacement therapy (HRT) to treat symptoms of menopause. Follow these instructions at home: Lifestyle  Do not use any products that contain nicotine or tobacco, such as cigarettes, e-cigarettes, and chewing tobacco. If you need help quitting, ask your health care provider.  Do not use street drugs.  Do not share needles.  Ask your health care provider for help if you need support or information about quitting drugs. Alcohol use  Do not drink alcohol if: ? Your health care provider tells you not to drink. ? You are pregnant, may be pregnant, or are planning to become pregnant.  If you drink alcohol: ? Limit how much you use to 0-1 drink a day. ? Limit intake if you are breastfeeding.  Be aware of how much alcohol is in your drink. In the [place] one drink equals one 12 oz bottle of beer (355 mL), one 5 oz glass of wine (148 mL), or one 1 oz glass of hard liquor (44 mL). General instructions  Schedule regular health, dental, and eye exams.  Stay current with your vaccines.  Tell your health care provider if: ? You often feel depressed. ? You have ever been abused or do not feel safe at home. Summary  Adopting a healthy lifestyle and getting preventive care are important in promoting health and wellness.  Follow your health care provider's instructions about healthy  diet, exercising, and getting tested or screened for diseases.  Follow your health care provider's instructions on monitoring your cholesterol and blood pressure. This information is not intended to replace advice given to you by your health care provider. Make sure you discuss any questions you have with your health care provider. Document Released: 08/07/2010 Document Revised: 01/15/2018 Document Reviewed: 01/15/2018 Elsevier Patient Education  2020 [employer] Inc[employer]  

## 2018-10-15 NOTE — Progress Notes (Signed)
Patient ID: Denise Fuller, female  DOB: December 25, 1935, 83 y.o.   MRN: [id number] Patient Care Team    Relationship Specialty Notifications Start End  Ma Hillock, DO PCP - General Family Medicine  01/10/16   Otelia Sergeant, OD Referring Physician   01/10/16    Comment: ophth- macular degeneration  [facility]  Dentistry  04/26/17     Chief Complaint  Patient presents with  . Annual Exam    Fasting. No longer gets mammograms or pap smears. Needs refills.     Subjective:  Denise Fuller is a 83 y.o.  Female  present for CPE. All past medical history, surgical history, allergies, family history, immunizations, medications and social history were updated in the electronic medical record today. All recent labs, ED visits and hospitalizations within the last year were reviewed.  Health maintenance:  Colonoscopy: > 75, N/A Mammogram: pt does not desire further testing.  Cervical cancer screening: > 65 n/a Immunizations: tdap UTD 2012, Influenza administered today(encouraged yearly), PNA series completed, Shingrix completed Infectious disease screening: n/a DEXA: pt declined Assistive device: none Oxygen [id number] Patient has a Dental home. Hospitalizations/ED visits: reviewed  Hypertension/HLD:  Pt reports compliance  with lisinopril 5 mg daily and Dyazide 37.5-25 milligrams daily. Blood pressures ranges at home WNL. Patient denies chest pain, shortness of breath, dizziness or lower extremity edema.  Pt takes a daily baby ASA. Pt is not prescribed statin. BMP: 08/2017 within normal limits CBC: 08/2017 within normal limits Lipids:04/2017  Total cholesterol 256, LDL 161, HDL 67, triglycerides 136 Diet: Low-sodium Exercise: Routine exercise- she is now walking again.  RF: Hypertension, hyperlipidemia, family history heart disease  Hypothyroidism/fatigue: Patient reports compliance with levothyroxine 125 g daily. She denies any flushing, palpitations, diarrhea or constipation.    Generalized anxiety disorder: trazodone only occasional use at bedtime. Doing well.  Depression screen [facility] 2/9 10/15/2018 04/10/2018 04/26/2017 04/23/2016 01/10/2016  Decreased Interest 0 0 0 0 0  Down, Depressed, Hopeless 0 0 0 0 0  PHQ - 2 Score 0 0 0 0 0  Altered sleeping - 0 - - -  Tired, decreased energy - 0 - - -  Change in appetite - 0 - - -  Feeling bad or failure about yourself  - 0 - - -  Trouble concentrating - 0 - - -  Moving slowly or fidgety/restless - 0 - - -  Suicidal thoughts - 0 - - -  PHQ-9 Score - 0 - - -  Difficult doing work/chores - Not difficult at all - - -   GAD 7 : Generalized Anxiety Score 04/10/2018  Nervous, Anxious, on Edge 0  Control/stop worrying 0  Worry too much - different things 0  Trouble relaxing 0  Restless 0  Easily annoyed or irritable 0  Afraid - awful might happen 0  Total GAD 7 Score 0  Anxiety Difficulty Not difficult at all    Immunization History  Administered Date(s) Administered  . Fluad Quad(high Dose 65+) 10/15/2018  . Influenza, High Dose Seasonal PF 01/10/2016, 11/08/2016, 11/27/2017  . Influenza-Unspecified 10/27/2014, 11/08/2016  . Pneumococcal Conjugate-13 10/27/2014  . Zoster Recombinat (Shingrix) 09/20/2017, 01/05/2018    Past Medical History:  Diagnosis Date  . Bartonella infection   . BCC (basal cell carcinoma of skin) 1997   nose  . Chickenpox   . Chronic venous insufficiency   . Diverticulosis   . Elevated alkaline phosphatase level   . Elevated LDH   . Environmental  allergies   . GAD (generalized anxiety disorder)   . Groin abscess    culture negative, doxy resolved abscess  . Hepatitis A   . Hyperlipidemia   . Hypertension   . Hypothyroidism   . Lymphadenopathy, cervical    chronic per pt. (left)  . Macular degeneration   . Meniere disease   . Thyroid disease    Allergies  Allergen Reactions  . Doxycycline     GI upset  . Keflex [Cephalexin] Hives and Itching   Past Surgical History:   Procedure Laterality Date  . ABDOMINAL HYSTERECTOMY  1991  . APPENDECTOMY  1967  . BASAL CELL CARCINOMA EXCISION  1997   nose  . BLEPHAROPLASTY    . CATARACT EXTRACTION W/ INTRAOCULAR LENS IMPLANT Bilateral Oct and Nov 2018  . CHOLECYSTECTOMY  1967   Family History  Problem Relation Age of Onset  . Arthritis Mother   . Heart disease Mother   . Arthritis Father   . Aneurysm Father 42       brain  . Thyroid disease Daughter    Social History   Social History Narrative   Retired. Married to Bruno. They have 3 children [name])   Orangetree from [place] 05/2014.    Has 2 older dogs.    Retired.    Drinks caffeine, takes herbal remedies and dialy vitamin.   Wears her seatbelt, smoke detector in the home   Wears dentures, no assistive devices for walking - independent.    Feels safe in her relationships.     Allergies as of 10/15/2018      Reactions   Doxycycline    GI upset   Keflex [cephalexin] Hives, Itching      Medication List       Accurate as of October 15, 2018 10:00 AM. If you have any questions, ask your nurse or doctor.        aspirin EC 81 MG tablet Take 81 mg by mouth daily.   Biotin 5000 MCG Tabs Take by mouth.   calcium citrate-vitamin D 315-200 MG-UNIT tablet Commonly known as: CITRACAL+D Take 1 tablet by mouth 2 (two) times daily.   cetirizine 10 MG tablet Commonly known as: ZYRTEC Take 10 mg by mouth daily.   co-enzyme Q-10 30 MG capsule Take 30 mg by mouth 3 (three) times daily.   Cranberry 500 MG Tabs Take 1 tablet by mouth 2 (two) times daily.   Durezol 0.05 % Emul Generic drug: Difluprednate INSTILL 1 DROP INTO LEFT EYE 3 TIMES A DAY   FLAX SEED OIL PO Take 1,300 mg by mouth 2 (two) times daily.   glucosamine-chondroitin 500-400 MG tablet Take 1 tablet by mouth 3 (three) times daily.   levothyroxine 125 MCG tablet Commonly known as: SYNTHROID Take 1 tablet (125 mcg total) by mouth daily.   lisinopril 5 MG tablet  Commonly known as: ZESTRIL Take 1 tablet (5 mg total) by mouth daily. What changed: See the new instructions. Changed by: Howard Pouch, DO   OMEGA-3 COMPLEX PO Take 360 mg by mouth daily.   Prolensa 0.07 % Soln Generic drug: Bromfenac Sodium PUT 1 DROP INTO LEFT EYE AT BEDTIME   SYSTANE OP Apply to eye.   traZODone 50 MG tablet Commonly known as: DESYREL TAKE 1/2-1 TABLETS (25-50 MG TOTAL) BY MOUTH AT BEDTIME AS NEEDED FOR SLEEP.   triamterene-hydrochlorothiazide 37.5-25 MG capsule Commonly known as: DYAZIDE Take 1 each (1 capsule total) by mouth daily. What changed: See  the new instructions. Changed by: Howard Pouch, DO   Vitamin D3 25 MCG (1000 UT) Caps Take by mouth.       All past medical history, surgical history, allergies, family history, immunizations andmedications were updated in the EMR today and reviewed under the history and medication portions of their EMR.     No results found for this or any previous visit (from the past 2160 hour(s)).  Patient was never admitted.   ROS: 14 pt review of systems performed and negative (unless mentioned in an HPI)  Objective: BP 114/72 (BP Location: Right Arm, Patient Position: Sitting, Cuff Size: Normal)   Pulse 68   Temp 97.8 F (36.6 C) (Temporal)   Resp 16   Ht 5\' 5"  (1.651 m)   Wt 161 lb 4 oz (73.1 kg)   SpO2 97%   BMI 26.83 kg/m  Gen: Afebrile. No acute distress. Nontoxic in appearance, well-developed, well-nourished,  Pleasant caucasian female.  HENT: AT. [place]. Bilateral TM visualized and normal in appearance, normal external auditory canal. MMM, no oral lesions, adequate dentition.  no Cough on exam, no hoarseness on exam. Eyes:Pupils Equal Round Reactive to light, Extraocular movements intact,  Conjunctiva without redness, discharge or icterus. Neck/lymp/endocrine: Supple,no lymphadenopathy, no thyromegaly CV: RRR no murmur, no edema, +2/4 P posterior tibialis pulses. no carotid bruits. No JVD. Chest:  CTAB, no wheeze, rhonchi or crackles. normal Respiratory effort. good Air movement. Abd: Soft. NTND. BS present. no Masses palpated. No hepatosplenomegaly. No rebound tenderness or guarding. Skin: no rashes, purpura or petechiae. Warm and well-perfused. Skin intact. Neuro/Msk:  Normal gait. PERLA. EOMi. Alert. Oriented x3.  Cranial nerves II through XII intact. Muscle strength 5/5 upper/lower extremity. DTRs equal bilaterally. Psych: Normal affect, dress and demeanor. Normal speech. Normal thought content and judgment.   No exam data present  Assessment/plan: Denise Fuller is a 83 y.o. female present for CPE. Need for influenza vaccination - Flu Vaccine QUAD High Dose(Fluad) GAD (generalized anxiety disorder) Stable. Only taking trazodone on occassions - not refills needed today per pt.  Ok to refill if needed every 6 mos as long as pt seen q 6 months.   Overweight (BMI 25.0-29.9)/Essential hypertension/HLD - stable. Continue lisinopril 5 and Dyazide. - CBC - Comprehensive metabolic panel - Lipid panel - triamterene-hydrochlorothiazide (DYAZIDE) 37.5-25 MG capsule; Take 1 each (1 capsule total) by mouth daily.  Dispense: 90 capsule; Refill: 1 - lisinopril (ZESTRIL) 5 MG tablet; Take 1 tablet (5 mg total) by mouth daily.  Dispense: 90 tablet; Refill: 1 - f/u 6 mos  Hypothyroidism, unspecified type Continue levothyroxine 125 mcg QD- refills will be provided after results received.  - CBC - Comprehensive metabolic panel - Lipid panel - TSH - f/u yearly.   Diabetes mellitus screening - Hemoglobin A1c  Encounter for preventive health examination Patient was encouraged to exercise greater than 150 minutes a week. Patient was encouraged to choose a diet filled with fresh fruits and vegetables, and lean meats. AVS provided to patient today for education/recommendation on gender specific health and safety maintenance. Colonoscopy: > 75, N/A Mammogram: pt does not desire further  testing.  Cervical cancer screening: > 65 n/a Immunizations: tdap UTD 2012, Influenza administered today(encouraged yearly), PNA series completed, Shingrix completed Infectious disease screening: n/a DEXA: pt declined  1 year CPE.  Return in about 6 months (around 04/14/2019).    Electronically signed by: Howard Pouch, DO [place]

## 2018-10-16 ENCOUNTER — Telehealth: Payer: Self-pay | Admitting: Family Medicine

## 2018-10-16 DIAGNOSIS — E039 Hypothyroidism, unspecified: Secondary | ICD-10-CM

## 2018-10-16 MED ORDER — LEVOTHYROXINE SODIUM 125 MCG PO TABS: ORAL_TABLET | ORAL | 3 refills | Status: DC

## 2018-10-16 NOTE — Telephone Encounter (Signed)
Please inform patient the following information: -Elevated alk phos-this is chronic and stable.  -Oversupplemented thyroid-keep dose of the pill the same, I have refilled this for her.  The only changes are she is going to take 1 tab 6 days a week and only a half a tab on Sunday. -Cholesterol panel looks great.  Blood counts are normal.  Liver and kidney function normal.  Diabetes screen negative.

## 2018-10-16 NOTE — Telephone Encounter (Signed)
Left detailed message, okay per DPR. Addressed the medication change and to contact the office if she has any questions or concerns.

## 2018-12-03 ENCOUNTER — Ambulatory Visit (INDEPENDENT_AMBULATORY_CARE_PROVIDER_SITE_OTHER): Payer: Medicare HMO | Admitting: Family Medicine

## 2018-12-03 ENCOUNTER — Other Ambulatory Visit: Payer: Self-pay

## 2018-12-03 ENCOUNTER — Encounter: Payer: Self-pay | Admitting: Family Medicine

## 2018-12-03 VITALS — BP 123/88 | HR 75 | Temp 97.3°F | Resp 16 | Ht 65.0 in | Wt 159.5 lb

## 2018-12-03 DIAGNOSIS — R531 Weakness: Secondary | ICD-10-CM

## 2018-12-03 DIAGNOSIS — R5383 Other fatigue: Secondary | ICD-10-CM | POA: Insufficient documentation

## 2018-12-03 DIAGNOSIS — R42 Dizziness and giddiness: Secondary | ICD-10-CM

## 2018-12-03 DIAGNOSIS — R002 Palpitations: Secondary | ICD-10-CM

## 2018-12-03 HISTORY — DX: Other fatigue: R53.83

## 2018-12-03 HISTORY — DX: Weakness: R53.1

## 2018-12-03 HISTORY — DX: Dizziness and giddiness: R42

## 2018-12-03 HISTORY — DX: Palpitations: R00.2

## 2018-12-03 NOTE — Patient Instructions (Signed)
Avoid caffeine. I suspect this may be your thyroid still overactive. We will collect labs today.    Hyperthyroidism  Hyperthyroidism is when the thyroid gland is too active (overactive). The thyroid gland is a small gland located in the lower front part of the neck, just in front of the windpipe (trachea). This gland makes hormones that help control how the body uses food for energy (metabolism) as well as how the heart and brain function. These hormones also play a role in keeping your bones strong. When the thyroid is overactive, it produces too much of a hormone called thyroxine. What are the causes? This condition may be caused by:  Graves' disease. This is a disorder in which the body's disease-fighting system (immune system) attacks the thyroid gland. This is the most common cause.  Inflammation of the thyroid gland.  A tumor in the thyroid gland.  Use of certain medicines, including: ? Prescription thyroid hormone replacement. ? Herbal supplements that mimic thyroid hormones. ? Amiodarone therapy.  Solid or fluid-filled lumps within your thyroid gland (thyroid nodules).  Taking in a large amount of iodine from foods or medicines. What increases the risk? You are more likely to develop this condition if:  You are female.  You have a family history of thyroid conditions.  You smoke tobacco.  You use a medicine called lithium.  You take medicines that affect the immune system (immunosuppressants). What are the signs or symptoms? Symptoms of this condition include:  Nervousness.  Inability to tolerate heat.  Unexplained weight loss.  Diarrhea.  Change in the texture of hair or skin.  Heart skipping beats or making extra beats.  Rapid heart rate.  Loss of menstruation.  Shaky hands.  Fatigue.  Restlessness.  Sleep problems.  Enlarged thyroid gland or a lump in the thyroid (nodule). You may also have symptoms of Graves' disease, which may include:   Protruding eyes.  Dry eyes.  Red or swollen eyes.  Problems with vision. How is this diagnosed? This condition may be diagnosed based on:  Your symptoms and medical history.  A physical exam.  Blood tests.  Thyroid ultrasound. This test involves using sound waves to produce images of the thyroid gland.  A thyroid scan. A radioactive substance is injected into a vein, and images show how much iodine is present in the thyroid.  Radioactive iodine uptake test (RAIU). A small amount of radioactive iodine is given by mouth to see how much iodine the thyroid absorbs after a certain amount of time. How is this treated? Treatment depends on the cause and severity of the condition. Treatment may include:  Medicines to reduce the amount of thyroid hormone your body makes.  Radioactive iodine treatment (radioiodine therapy). This involves swallowing a small dose of radioactive iodine, in capsule or liquid form, to kill thyroid cells.  Surgery to remove part or all of your thyroid gland. You may need to take thyroid hormone replacement medicine for the rest of your life after thyroid surgery.  Medicines to help manage your symptoms. Follow these instructions at home:   Take over-the-counter and prescription medicines only as told by your health care provider.  Do not use any products that contain nicotine or tobacco, such as cigarettes and e-cigarettes. If you need help quitting, ask your health care provider.  Follow any instructions from your health care provider about diet. You may be instructed to limit foods that contain iodine.  Keep all follow-up visits as told by your health care provider.  This is important. ? You will need to have blood tests regularly so that your health care provider can monitor your condition. Contact a health care provider if:  Your symptoms do not get better with treatment.  You have a fever.  You are taking thyroid hormone replacement medicine and  you: ? Have symptoms of depression. ? Feel like you are tired all the time. ? Gain weight. Get help right away if:  You have chest pain.  You have decreased alertness or a change in your awareness.  You have abdominal pain.  You feel dizzy.  You have a rapid heartbeat.  You have an irregular heartbeat.  You have difficulty breathing. Summary  The thyroid gland is a small gland located in the lower front part of the neck, just in front of the windpipe (trachea).  Hyperthyroidism is when the thyroid gland is too active (overactive) and produces too much of a hormone called thyroxine.  The most common cause is Graves' disease, a disorder in which your immune system attacks the thyroid gland.  Hyperthyroidism can cause various symptoms, such as unexplained weight loss, nervousness, inability to tolerate heat, or changes in your heartbeat.  Treatment may include medicine to reduce the amount of thyroid hormone your body makes, radioiodine therapy, surgery, or medicines to manage symptoms. This information is not intended to replace advice given to you by your health care provider. Make sure you discuss any questions you have with your health care provider. Document Released: 01/22/2005 Document Revised: 01/04/2017 Document Reviewed: 01/02/2017 Elsevier Patient Education  2020 [employer].

## 2018-12-03 NOTE — Progress Notes (Signed)
Denise Fuller , Dec 25, 1935, 83 y.o., female MRN: [id number] Patient Care Team    Relationship Specialty Notifications Start End  Ma Hillock, DO PCP - General Family Medicine  01/10/16   Otelia Sergeant, OD Referring Physician   01/10/16    Comment: ophth- macular degeneration  [facility]  04/26/17     Chief Complaint  Patient presents with  . Dizziness    Pt feels lightheaded after breakfast each morning. Fatigue everyday.      Subjective: Pt presents for an OV with complaints of lightheadedness after drinking her morning coffee and eating a breakfast pastry- worsening over hte last month. She states she feels very fatigued, all the time. She has an anxious feeling or "too much coffee" feeling in her chest. She denies chest pain, shortness of breath or syncope. She has a h/o menieres. Her thyroid dose was decreased to over supplmenation about 6 weeks ago.   Depression screen [facility] 2/9 10/15/2018 04/10/2018 04/26/2017 04/23/2016 01/10/2016  Decreased Interest 0 0 0 0 0  Down, Depressed, Hopeless 0 0 0 0 0  PHQ - 2 Score 0 0 0 0 0  Altered sleeping - 0 - - -  Tired, decreased energy - 0 - - -  Change in appetite - 0 - - -  Feeling bad or failure about yourself  - 0 - - -  Trouble concentrating - 0 - - -  Moving slowly or fidgety/restless - 0 - - -  Suicidal thoughts - 0 - - -  PHQ-9 Score - 0 - - -  Difficult doing work/chores - Not difficult at all - - -    Allergies  Allergen Reactions  . Doxycycline     GI upset  . Keflex [Cephalexin] Hives and Itching   Social History   Social History Narrative   Retired. Married to Denise Fuller. They have 3 children Denise Fuller)   Denise Fuller from [place] 05/2014.    Has 2 older dogs.    Retired.    Drinks caffeine, takes herbal remedies and dialy vitamin.   Wears her seatbelt, smoke detector in the home   Wears dentures, no assistive devices for walking - independent.    Feels safe in her relationships.    Past Medical  History:  Diagnosis Date  . Bartonella infection   . BCC (basal cell carcinoma of skin) 1997   nose  . Chickenpox   . Chronic venous insufficiency   . Diverticulosis   . Elevated alkaline phosphatase level   . Elevated LDH   . Environmental allergies   . GAD (generalized anxiety disorder)   . Groin abscess    culture negative, doxy resolved abscess  . Hepatitis A   . Hyperlipidemia   . Hypertension   . Hypothyroidism   . Lymphadenopathy, cervical    chronic per pt. (left)  . Macular degeneration   . Meniere disease   . Thyroid disease    Past Surgical History:  Procedure Laterality Date  . ABDOMINAL HYSTERECTOMY  1991  . APPENDECTOMY  1967  . BASAL CELL CARCINOMA EXCISION  1997   nose  . BLEPHAROPLASTY    . CATARACT EXTRACTION W/ INTRAOCULAR LENS IMPLANT Bilateral Oct and Nov 2018  . CHOLECYSTECTOMY  1967   Family History  Problem Relation Age of Onset  . Arthritis Mother   . Heart disease Mother   . Arthritis Father   . Aneurysm Father 60  brain  . Thyroid disease Daughter    Allergies as of 12/03/2018      Reactions   Doxycycline    GI upset   Keflex [cephalexin] Hives, Itching      Medication List       Accurate as of December 03, 2018  3:08 PM. If you have any questions, ask your nurse or doctor.        aspirin EC 81 MG tablet Take 81 mg by mouth daily.   Biotin 5000 MCG Tabs Take by mouth.   calcium citrate-vitamin D 315-200 MG-UNIT tablet Commonly known as: CITRACAL+D Take 1 tablet by mouth 2 (two) times daily.   cetirizine 10 MG tablet Commonly known as: ZYRTEC Take 10 mg by mouth daily.   co-enzyme Q-10 30 MG capsule Take 30 mg by mouth 3 (three) times daily.   Cranberry 500 MG Tabs Take 1 tablet by mouth 2 (two) times daily.   Durezol 0.05 % Emul Generic drug: Difluprednate INSTILL 1 DROP INTO LEFT EYE 3 TIMES A DAY   FLAX SEED OIL PO Take 1,300 mg by mouth 2 (two) times daily.   glucosamine-chondroitin 500-400 MG tablet  Take 1 tablet by mouth 3 (three) times daily.   levothyroxine 125 MCG tablet Commonly known as: SYNTHROID 1 tab daily 6 days a week and half a tab 1 day a week.   lisinopril 5 MG tablet Commonly known as: ZESTRIL Take 1 tablet (5 mg total) by mouth daily.   OMEGA-3 COMPLEX PO Take 360 mg by mouth daily.   Prolensa 0.07 % Soln Generic drug: Bromfenac Sodium PUT 1 DROP INTO LEFT EYE AT BEDTIME   SYSTANE OP Apply to eye.   traZODone 50 MG tablet Commonly known as: DESYREL TAKE 1/2-1 TABLETS (25-50 MG TOTAL) BY MOUTH AT BEDTIME AS NEEDED FOR SLEEP.   triamterene-hydrochlorothiazide 37.5-25 MG capsule Commonly known as: DYAZIDE Take 1 each (1 capsule total) by mouth daily.   Vitamin D3 25 MCG (1000 UT) Caps Take by mouth.       All past medical history, surgical history, allergies, family history, immunizations andmedications were updated in the EMR today and reviewed under the history and medication portions of their EMR.     ROS: Negative, with the exception of above mentioned in HPI   Objective:  BP 123/88 (BP Location: Right Arm, Patient Position: Sitting, Cuff Size: Normal)   Pulse 75   Temp (!) 97.3 F (36.3 C) (Temporal)   Resp 16   Ht 5\' 5"  (1.651 m)   Wt 159 lb 8 oz (72.3 kg)   SpO2 99%   BMI 26.54 kg/m  Body mass index is 26.54 kg/m. Gen: Afebrile. No acute distress. Nontoxic in appearance, well developed, well nourished.  HENT: AT. .  Eyes:Pupils Equal Round Reactive to light, Extraocular movements intact,  Conjunctiva without redness, discharge or icterus. Neck/lymp/endocrine: Supple,no lymphadenopathy, no thyromegaly CV: RRR no murmur, no edema Chest: CTAB, no wheeze or crackles. Good air movement, normal resp effort.  Neuro: Normal gait. PERLA. EOMi. Alert. Oriented x3  Psych: Normal affect, dress and demeanor. Normal speech. Normal thought content and judgment.  No exam data present No results found. No results found for this or any previous  visit (from the past 24 hour(s)).  Assessment/Plan: Denise Fuller is a 83 y.o. female present for OV for  Fatigue/palpitations/weakness/lightheadness - her thyroid had been over replaced early September. Her dose was cut back, however her symptoms suggest she may need additional adjustment. Collect  labs today to rule out thyroid, electrolytes, anemia or iron def as cause of her symptoms.  - avoid caffeine use. Typically occurs in the morning after coffee and sugary breakfast pastry.  - Monitor BP if occurs again to rule out drop in BP as possible cause.  - TSH - T4, free - Basic Metabolic Panel (BMET) - CBC w/Diff - Iron, TIBC and Ferritin Panel - f/u dependent upon labs.    Reviewed expectations re: course of current medical issues.  Discussed self-management of symptoms.  Outlined signs and symptoms indicating need for more acute intervention.  Patient verbalized understanding and all questions were answered.  Patient received an After-Visit Summary.   > 25 minutes spent with patient, >50% of time spent face to face     Orders Placed This Encounter  Procedures  . TSH  . T4, free  . Basic Metabolic Panel (BMET)  . CBC w/Diff  . Iron, TIBC and Ferritin Panel     Note is dictated utilizing voice recognition software. Although note has been proof read prior to signing, occasional typographical errors still can be missed. If any questions arise, please do not hesitate to call for verification.   electronically signed by:  Howard Pouch, DO  [place]

## 2018-12-04 LAB — IRON,TIBC AND FERRITIN PANEL
%SAT: 12 % (calc) — ABNORMAL LOW (ref 16–45)
Ferritin: 25 ng/mL (ref 16–288)
Iron: 40 ug/dL — ABNORMAL LOW (ref 45–160)
TIBC: 344 mcg/dL (calc) (ref 250–450)

## 2018-12-05 ENCOUNTER — Telehealth: Payer: Self-pay | Admitting: Family Medicine

## 2018-12-05 DIAGNOSIS — E039 Hypothyroidism, unspecified: Secondary | ICD-10-CM

## 2018-12-05 LAB — CBC WITH DIFFERENTIAL/PLATELET
Basophils Relative: 1 % (ref 0.0–3.0)
Eosinophils Relative: 2.6 % (ref 0.0–5.0)
HCT: 40.8 % (ref 36.0–46.0)
Hemoglobin: 13.4 g/dL (ref 12.0–15.0)
Lymphocytes Relative: 30.5 % (ref 12.0–46.0)
MCHC: 32.8 g/dL (ref 30.0–36.0)
MCV: 92.9 fl (ref 78.0–100.0)
Monocytes Relative: 6.4 % (ref 3.0–12.0)
Neutrophils Relative %: 59.5 % (ref 43.0–77.0)
Platelets: 297 10*3/uL (ref 150.0–400.0)
RBC: 4.39 Mil/uL (ref 3.87–5.11)
RDW: 15.7 % — ABNORMAL HIGH (ref 11.5–15.5)
WBC: 7.6 10*3/uL (ref 4.0–10.5)

## 2018-12-05 LAB — BASIC METABOLIC PANEL
BUN: 14 mg/dL (ref 6–23)
CO2: 25 mEq/L (ref 19–32)
Calcium: 9.3 mg/dL (ref 8.4–10.5)
Chloride: 103 mEq/L (ref 96–112)
Creatinine, Ser: 0.67 mg/dL (ref 0.40–1.20)
GFR: 84.48 mL/min (ref >60.00)
Glucose, Bld: 114 mg/dL — ABNORMAL HIGH (ref 70–99)
Potassium: 3.6 mEq/L (ref 3.5–5.1)
Sodium: 137 mEq/L (ref 135–145)

## 2018-12-05 LAB — TSH: TSH: 0.2 u[IU]/mL — ABNORMAL LOW (ref 0.35–4.50)

## 2018-12-05 LAB — T4, FREE: Free T4: 1.38 ng/dL (ref 0.60–1.60)

## 2018-12-05 MED ORDER — LEVOTHYROXINE SODIUM 112 MCG PO TABS: ORAL_TABLET | ORAL | 0 refills | Status: DC

## 2018-12-05 MED ORDER — FERROUS SULFATE 324 (65 FE) MG PO TBEC: DELAYED_RELEASE_TABLET | ORAL | 0 refills | Status: DC

## 2018-12-05 NOTE — Telephone Encounter (Signed)
Pt was called and given lab results/ instructions. She verbalized understanding and was scheduled for F/U appt

## 2018-12-05 NOTE — Telephone Encounter (Signed)
Please inform patient the following information: She has low iron levels and her thyroid is still a little over supplemented.  I have called in the 112 mcg levothyroxine to take once daily.  Make sure to take on an empty stomach, and avoid eating for 30 minutes  after taking. -If she has a lot of pills of the current 125 mcg dose still she can use those by taking 1 tab of 125 mcg 5 times a week and a half a tab 2 times a week--at least until the new dose comes in.  Follow-up in 8 weeks.  Please schedule this for her I called her levothyroxine into her mail-in pharmacy and the iron since is going to be short-term for approximately 3 months only to her local pharmacy. Watch for any constipation development with use of iron, if need be use of stool softener when using iron.   Both of these are likely causing her symptoms.

## 2018-12-22 ENCOUNTER — Telehealth: Payer: Self-pay | Admitting: Family Medicine

## 2018-12-22 DIAGNOSIS — E611 Iron deficiency: Secondary | ICD-10-CM

## 2018-12-22 NOTE — Telephone Encounter (Signed)
Stop the iron tablets.  The other options are to increase her iron enriched foods and  Last option is referral to blood specialist to consider iron infusion.  It would be odd to be allergic to iron itself since she is tolerating foods that contain iron. It is possibly the inert ingredient in the tablet is causing the reaction.  Many cereals, whole grains and green leafy vegetables are good sources of iron naturally. - her choice:   - we can refer her now for iron deficiency   Or    - wait until her follow up (6-8 weeks from her last appt) to see how she feels after correcting her thyroid.

## 2018-12-22 NOTE — Telephone Encounter (Signed)
Patient thinks she is having a reaction to iron meds - Has rash underneath her neck  Please contact patient at [phone]

## 2018-12-22 NOTE — Telephone Encounter (Signed)
Pt was called back and pt has been taking iron for two weeks and developed rash under neck Friday. Pt states she has had itching since starting the medication and feeling light headed. She has been checking BP regularly and it has been WNL. Denies SOB or swelling. She states she has not taken today and does not want to take anymore and that daughter has the same problem when taking iron supplements. Pt was advised not to take medication until Dr Raoul Pitch reviews and to seek emergency help if she begins to have difficulty breathing or swelling, she verbalized understanding

## 2018-12-23 NOTE — Telephone Encounter (Signed)
Pt was called and prefers to get iron with iron enriched foods. She has F/U appt scheduled. Pt was advised to call back if she was having any issues or needed assistance, she verbalized understanding

## 2019-02-02 ENCOUNTER — Other Ambulatory Visit: Payer: Self-pay | Admitting: Family Medicine

## 2019-02-02 DIAGNOSIS — E039 Hypothyroidism, unspecified: Secondary | ICD-10-CM

## 2019-02-04 ENCOUNTER — Ambulatory Visit: Payer: Medicare HMO | Admitting: Family Medicine

## 2019-02-05 ENCOUNTER — Encounter: Payer: Self-pay | Admitting: Family Medicine

## 2019-02-05 ENCOUNTER — Ambulatory Visit (INDEPENDENT_AMBULATORY_CARE_PROVIDER_SITE_OTHER): Payer: Medicare HMO | Admitting: Family Medicine

## 2019-02-05 ENCOUNTER — Other Ambulatory Visit: Payer: Self-pay

## 2019-02-05 VITALS — BP 120/79 | HR 86 | Temp 98.1°F | Resp 16 | Ht 65.0 in | Wt 162.5 lb

## 2019-02-05 DIAGNOSIS — R300 Dysuria: Secondary | ICD-10-CM

## 2019-02-05 DIAGNOSIS — N390 Urinary tract infection, site not specified: Secondary | ICD-10-CM | POA: Diagnosis not present

## 2019-02-05 DIAGNOSIS — R8290 Unspecified abnormal findings in urine: Secondary | ICD-10-CM

## 2019-02-05 LAB — POCT URINALYSIS DIPSTICK
Bilirubin, UA: NEGATIVE
Blood, UA: 10
Glucose, UA: NEGATIVE
Ketones, UA: NEGATIVE
Nitrite, UA: NEGATIVE
Protein, UA: NEGATIVE
Spec Grav, UA: 1.01 (ref 1.010–1.025)
Urobilinogen, UA: 0.2 E.U./dL
pH, UA: 7.5 (ref 5.0–8.0)

## 2019-02-05 MED ORDER — SULFAMETHOXAZOLE-TRIMETHOPRIM 800-160 MG PO TABS: 1.0000 | ORAL_TABLET | Freq: Two times a day (BID) | ORAL | 0 refills | Status: DC

## 2019-02-05 NOTE — Patient Instructions (Signed)
Rest. Hydrate! I have called in the bactrim antibiotic for you- it is twice a day for 7 days.   Hope you get better quickly.     Urinary Tract Infection, Adult A urinary tract infection (UTI) is an infection of any part of the urinary tract. The urinary tract includes:  The kidneys.  The ureters.  The bladder.  The urethra. These organs make, store, and get rid of pee (urine) in the body. What are the causes? This is caused by germs (bacteria) in your genital area. These germs grow and cause swelling (inflammation) of your urinary tract. What increases the risk? You are more likely to develop this condition if:  You have a small, thin tube (catheter) to drain pee.  You cannot control when you pee or poop (incontinence).  You are female, and: ? You use these methods to prevent pregnancy:  A medicine that kills sperm (spermicide).  A device that blocks sperm (diaphragm). ? You have low levels of a female hormone (estrogen). ? You are pregnant.  You have genes that add to your risk.  You are sexually active.  You take antibiotic medicines.  You have trouble peeing because of: ? A prostate that is bigger than normal, if you are female. ? A blockage in the part of your body that drains pee from the bladder (urethra). ? A kidney stone. ? A nerve condition that affects your bladder (neurogenic bladder). ? Not getting enough to drink. ? Not peeing often enough.  You have other conditions, such as: ? Diabetes. ? A weak disease-fighting system (immune system). ? Sickle cell disease. ? Gout. ? Injury of the spine. What are the signs or symptoms? Symptoms of this condition include:  Needing to pee right away (urgently).  Peeing often.  Peeing small amounts often.  Pain or burning when peeing.  Blood in the pee.  Pee that smells bad or not like normal.  Trouble peeing.  Pee that is cloudy.  Fluid coming from the vagina, if you are female.  Pain in the  belly or lower back. Other symptoms include:  Throwing up (vomiting).  No urge to eat.  Feeling mixed up (confused).  Being tired and grouchy (irritable).  A fever.  Watery poop (diarrhea). How is this treated? This condition may be treated with:  Antibiotic medicine.  Other medicines.  Drinking enough water. Follow these instructions at home:  Medicines  Take over-the-counter and prescription medicines only as told by your doctor.  If you were prescribed an antibiotic medicine, take it as told by your doctor. Do not stop taking it even if you start to feel better. General instructions  Make sure you: ? Pee until your bladder is empty. ? Do not hold pee for a long time. ? Empty your bladder after sex. ? Wipe from front to back after pooping if you are a female. Use each tissue one time when you wipe.  Drink enough fluid to keep your pee pale yellow.  Keep all follow-up visits as told by your doctor. This is important. Contact a doctor if:  You do not get better after 1-2 days.  Your symptoms go away and then come back. Get help right away if:  You have very bad back pain.  You have very bad pain in your lower belly.  You have a fever.  You are sick to your stomach (nauseous).  You are throwing up. Summary  A urinary tract infection (UTI) is an infection of any part  of the urinary tract.  This condition is caused by germs in your genital area.  There are many risk factors for a UTI. These include having a small, thin tube to drain pee and not being able to control when you pee or poop.  Treatment includes antibiotic medicines for germs.  Drink enough fluid to keep your pee pale yellow. This information is not intended to replace advice given to you by your health care provider. Make sure you discuss any questions you have with your health care provider. Document Revised: 01/09/2018 Document Reviewed: 08/01/2017 Elsevier Patient Education  2020  [employer].

## 2019-02-05 NOTE — Progress Notes (Signed)
This visit occurred during the SARS-CoV-2 public health emergency.  Safety protocols were in place, including screening questions prior to the visit, additional usage of staff PPE, and extensive cleaning of exam room while observing appropriate contact time as indicated for disinfecting solutions.    Denise Fuller , 05/06/1935, 83 y.o., female MRN: [id number] Patient Care Team    Relationship Specialty Notifications Start End  Denise Hillock, DO PCP - General Family Medicine  01/10/16   Denise Fuller, OD Referring Physician   01/10/16    Comment: ophth- macular degeneration  [facility]  04/26/17     Chief Complaint  Patient presents with  . Urinary Tract Infection    Burning with urination with lower back pain x3 days.      Subjective: Pt presents for an OV with complaints of dysuria of 3-4 days duration.  Associated symptoms include low back discomfort. She denies fever, chills, nausea or vomit.  Pt has tried nothing to ease their symptoms.   Depression screen [facility] 2/9 10/15/2018 04/10/2018 04/26/2017 04/23/2016 01/10/2016  Decreased Interest 0 0 0 0 0  Down, Depressed, Hopeless 0 0 0 0 0  PHQ - 2 Score 0 0 0 0 0  Altered sleeping - 0 - - -  Tired, decreased energy - 0 - - -  Change in appetite - 0 - - -  Feeling bad or failure about yourself  - 0 - - -  Trouble concentrating - 0 - - -  Moving slowly or fidgety/restless - 0 - - -  Suicidal thoughts - 0 - - -  PHQ-9 Score - 0 - - -  Difficult doing work/chores - Not difficult at all - - -    Allergies  Allergen Reactions  . Doxycycline     GI upset  . Keflex [Cephalexin] Hives and Itching   Social History   Social History Narrative   Retired. Married to Denise Fuller. They have 3 children Denise Fuller)   [place] from [place] 05/2014.    Has 2 older dogs.    Retired.    Drinks caffeine, takes herbal remedies and dialy vitamin.   Wears her seatbelt, smoke detector in the home   Wears dentures, no assistive  devices for walking - independent.    Feels safe in her relationships.    Past Medical History:  Diagnosis Date  . Bartonella infection   . BCC (basal cell carcinoma of skin) 1997   nose  . Chickenpox   . Chronic venous insufficiency   . Diverticulosis   . Elevated alkaline phosphatase level   . Elevated LDH   . Environmental allergies   . GAD (generalized anxiety disorder)   . Groin abscess    culture negative, doxy resolved abscess  . Hepatitis A   . Hyperlipidemia   . Hypertension   . Hypothyroidism   . Lymphadenopathy, cervical    chronic per pt. (left)  . Macular degeneration   . Meniere disease   . Thyroid disease    Past Surgical History:  Procedure Laterality Date  . ABDOMINAL HYSTERECTOMY  1991  . APPENDECTOMY  1967  . BASAL CELL CARCINOMA EXCISION  1997   nose  . BLEPHAROPLASTY    . CATARACT EXTRACTION W/ INTRAOCULAR LENS IMPLANT Bilateral Oct and Nov 2018  . CHOLECYSTECTOMY  1967   Family History  Problem Relation Age of Onset  . Arthritis Mother   . Heart disease Mother   . Arthritis  Father   . Aneurysm Father 47       brain  . Thyroid disease Daughter    Allergies as of 02/05/2019      Reactions   Doxycycline    GI upset   Keflex [cephalexin] Hives, Itching      Medication List       Accurate as of February 05, 2019  8:58 AM. If you have any questions, ask your nurse or doctor.        aspirin EC 81 MG tablet Take 81 mg by mouth daily.   Biotin 5000 MCG Tabs Take by mouth.   calcium citrate-vitamin D 315-200 MG-UNIT tablet Commonly known as: CITRACAL+D Take 1 tablet by mouth 2 (two) times daily.   cetirizine 10 MG tablet Commonly known as: ZYRTEC Take 10 mg by mouth daily.   co-enzyme Q-10 30 MG capsule Take 30 mg by mouth 3 (three) times daily.   Cranberry 500 MG Tabs Take 1 tablet by mouth 2 (two) times daily.   Durezol 0.05 % Emul Generic drug: Difluprednate INSTILL 1 DROP INTO LEFT EYE 3 TIMES A DAY   ferrous sulfate  324 (65 Fe) MG Tbec 1 tablet  Monday, Wednesday, Friday   FLAX SEED OIL PO Take 1,300 mg by mouth 2 (two) times daily.   glucosamine-chondroitin 500-400 MG tablet Take 1 tablet by mouth 3 (three) times daily.   levothyroxine 112 MCG tablet Commonly known as: SYNTHROID 1 tablet daily on an empty stomach   lisinopril 5 MG tablet Commonly known as: ZESTRIL Take 1 tablet (5 mg total) by mouth daily.   OMEGA-3 COMPLEX PO Take 360 mg by mouth daily.   Prolensa 0.07 % Soln Generic drug: Bromfenac Sodium PUT 1 DROP INTO LEFT EYE AT BEDTIME   SYSTANE OP Apply to eye.   traZODone 50 MG tablet Commonly known as: DESYREL TAKE 1/2-1 TABLETS (25-50 MG TOTAL) BY MOUTH AT BEDTIME AS NEEDED FOR SLEEP.   triamterene-hydrochlorothiazide 37.5-25 MG capsule Commonly known as: DYAZIDE Take 1 each (1 capsule total) by mouth daily.   Vitamin D3 25 MCG (1000 UT) Caps Take by mouth.       All past medical history, surgical history, allergies, family history, immunizations andmedications were updated in the EMR today and reviewed under the history and medication portions of their EMR.     ROS: Negative, with the exception of above mentioned in HPI   Objective:  BP 120/79 (BP Location: Right Arm, Patient Position: Sitting, Cuff Size: Normal)   Pulse 86   Temp 98.1 F (36.7 C) (Temporal)   Resp 16   Ht 5\' 5"  (1.651 m)   Wt 162 lb 8 oz (73.7 kg)   SpO2 98%   BMI 27.04 kg/m  Body mass index is 27.04 kg/m. Gen: Afebrile. No acute distress. Nontoxic in appearance, well developed, well nourished.  HENT: AT. [place]. MMM Eyes:Pupils Equal Round Reactive to light, Extraocular movements intact,  Conjunctiva without redness, discharge or icterus. CV: RRR  Chest: CTAB, no wheeze or crackles. Good air movement, normal resp effort.  Abd: Soft. NTND. BS presnet MSK: No CVA Tenderness bilateral. Neuro: Normal gait. PERLA. EOMi. Alert. Oriented x3   No exam data present No results found. Results  for orders placed or performed in visit on 02/05/19 (from the past 24 hour(s))  POCT Urinalysis Dipstick     Status: Abnormal   Collection Time: 02/05/19  8:41 AM  Result Value Ref Range   Color, UA yellow  Clarity, UA clear    Glucose, UA Negative Negative   Bilirubin, UA negative    Ketones, UA negative    Spec Grav, UA 1.010 1.010 - 1.025   Blood, UA 10    pH, UA 7.5 5.0 - 8.0   Protein, UA Negative Negative   Urobilinogen, UA 0.2 0.2 or 1.0 E.U./dL   Nitrite, UA negative    Leukocytes, UA 4+ (A) Negative   Appearance     Odor      Assessment/Plan: Denise Fuller is a 83 y.o. female present for OV for  Burning with urination/Abnormal urine/Urinary tract infection without hematuria, site unspecified - POCT urine appears infected today. Started treatment w/ bactrim prescribed. Encouraged her to rest and hydrate.  Discussed the majority of UTI has been in Winter- which may be related to activity/hydration levels or clothing etc.  - POCT Urinalysis Dipstick - Urine Culture sent.  Pt will be called with results    Reviewed expectations re: course of current medical issues.  Discussed self-management of symptoms.  Outlined signs and symptoms indicating need for more acute intervention.  Patient verbalized understanding and all questions were answered.  Patient received an After-Visit Summary.    Orders Placed This Encounter  Procedures  . POCT Urinalysis Dipstick     Note is dictated utilizing voice recognition software. Although note has been proof read prior to signing, occasional typographical errors still can be missed. If any questions arise, please do not hesitate to call for verification.   electronically signed by:  Howard Pouch, DO  [place]

## 2019-02-07 LAB — URINE CULTURE
MICRO NUMBER:: 1245821
SPECIMEN QUALITY:: ADEQUATE

## 2019-02-11 ENCOUNTER — Encounter: Payer: Self-pay | Admitting: Family Medicine

## 2019-02-11 ENCOUNTER — Other Ambulatory Visit: Payer: Self-pay

## 2019-02-11 ENCOUNTER — Ambulatory Visit (INDEPENDENT_AMBULATORY_CARE_PROVIDER_SITE_OTHER): Payer: Medicare HMO | Admitting: Family Medicine

## 2019-02-11 VITALS — BP 117/77 | HR 80 | Temp 97.7°F | Resp 16 | Ht 65.0 in | Wt 160.5 lb

## 2019-02-11 DIAGNOSIS — R5383 Other fatigue: Secondary | ICD-10-CM

## 2019-02-11 DIAGNOSIS — E039 Hypothyroidism, unspecified: Secondary | ICD-10-CM

## 2019-02-11 DIAGNOSIS — R790 Abnormal level of blood mineral: Secondary | ICD-10-CM | POA: Diagnosis not present

## 2019-02-11 LAB — TSH: TSH: 1.24 u[IU]/mL (ref 0.35–4.50)

## 2019-02-11 LAB — T4, FREE: Free T4: 1 ng/dL (ref 0.60–1.60)

## 2019-02-11 NOTE — Progress Notes (Signed)
Denise Fuller , 03/27/1935, 84 y.o., female MRN: [id number] Patient Care Team    Relationship Specialty Notifications Start End  Ma Hillock, DO PCP - General Family Medicine  01/10/16   Otelia Sergeant, OD Referring Physician   01/10/16    Comment: ophth- macular degeneration  [facility]  04/26/17     Chief Complaint  Patient presents with  . Follow-up    Pt is here for F/U for Iron and Thyroid. Pt was unable to take iron tablets, she got rash on neck.      Subjective: Pt presents for follow up on abnormal thyroid level. Dose was decreased 8 weeks ago. She was also started on iron supplementation for low iron levels and could not tolerate oral supplement 2/2 to rash. She has increased the iron content of her diet.She is still having fatigue. No additional palpitations.   Depression screen [facility] 2/9 10/15/2018 04/10/2018 04/26/2017 04/23/2016 01/10/2016  Decreased Interest 0 0 0 0 0  Down, Depressed, Hopeless 0 0 0 0 0  PHQ - 2 Score 0 0 0 0 0  Altered sleeping - 0 - - -  Tired, decreased energy - 0 - - -  Change in appetite - 0 - - -  Feeling bad or failure about yourself  - 0 - - -  Trouble concentrating - 0 - - -  Moving slowly or fidgety/restless - 0 - - -  Suicidal thoughts - 0 - - -  PHQ-9 Score - 0 - - -  Difficult doing work/chores - Not difficult at all - - -    Allergies  Allergen Reactions  . Doxycycline     GI upset  . Keflex [Cephalexin] Hives and Itching  . Iron Rash   Social History   Social History Narrative   Retired. Married to [place]. They have 3 children [name])   [place] from [place] 05/2014.    Has 2 older dogs.    Retired.    Drinks caffeine, takes herbal remedies and dialy vitamin.   Wears her seatbelt, smoke detector in the home   Wears dentures, no assistive devices for walking - independent.    Feels safe in her relationships.    Past Medical History:  Diagnosis Date  . Bartonella infection   . BCC (basal cell  carcinoma of skin) 1997   nose  . Chickenpox   . Chronic venous insufficiency   . Diverticulosis   . Elevated alkaline phosphatase level   . Elevated LDH   . Environmental allergies   . GAD (generalized anxiety disorder)   . Groin abscess    culture negative, doxy resolved abscess  . Hepatitis A   . Hyperlipidemia   . Hypertension   . Hypothyroidism   . Lymphadenopathy, cervical    chronic per pt. (left)  . Macular degeneration   . Meniere disease   . Thyroid disease    Past Surgical History:  Procedure Laterality Date  . ABDOMINAL HYSTERECTOMY  1991  . APPENDECTOMY  1967  . BASAL CELL CARCINOMA EXCISION  1997   nose  . BLEPHAROPLASTY    . CATARACT EXTRACTION W/ INTRAOCULAR LENS IMPLANT Bilateral Oct and Nov 2018  . CHOLECYSTECTOMY  1967   Family History  Problem Relation Age of Onset  . Arthritis Mother   . Heart disease Mother   . Arthritis Father   . Aneurysm Father 33       brain  . Thyroid disease  Daughter    Allergies as of 02/11/2019      Reactions   Doxycycline    GI upset   Keflex [cephalexin] Hives, Itching   Iron Rash      Medication List       Accurate as of February 11, 2019 10:48 AM. If you have any questions, ask your nurse or doctor.        STOP taking these medications   ferrous sulfate 324 (65 Fe) MG Tbec Stopped by: Howard Pouch, DO     TAKE these medications   aspirin EC 81 MG tablet Take 81 mg by mouth daily.   Biotin 5000 MCG Tabs Take by mouth.   calcium citrate-vitamin D 315-200 MG-UNIT tablet Commonly known as: CITRACAL+D Take 1 tablet by mouth 2 (two) times daily.   cetirizine 10 MG tablet Commonly known as: ZYRTEC Take 10 mg by mouth daily.   co-enzyme Q-10 30 MG capsule Take 30 mg by mouth 3 (three) times daily.   Cranberry 500 MG Tabs Take 1 tablet by mouth 2 (two) times daily.   Durezol 0.05 % Emul Generic drug: Difluprednate INSTILL 1 DROP INTO LEFT EYE 3 TIMES A DAY   FLAX SEED OIL PO Take 1,300 mg by  mouth 2 (two) times daily.   glucosamine-chondroitin 500-400 MG tablet Take 1 tablet by mouth 3 (three) times daily.   levothyroxine 112 MCG tablet Commonly known as: SYNTHROID 1 tablet daily on an empty stomach   lisinopril 5 MG tablet Commonly known as: ZESTRIL Take 1 tablet (5 mg total) by mouth daily.   OMEGA-3 COMPLEX PO Take 360 mg by mouth daily.   Prolensa 0.07 % Soln Generic drug: Bromfenac Sodium PUT 1 DROP INTO LEFT EYE AT BEDTIME   sulfamethoxazole-trimethoprim 800-160 MG tablet Commonly known as: BACTRIM DS Take 1 tablet by mouth 2 (two) times daily.   SYSTANE OP Apply to eye.   traZODone 50 MG tablet Commonly known as: DESYREL TAKE 1/2-1 TABLETS (25-50 MG TOTAL) BY MOUTH AT BEDTIME AS NEEDED FOR SLEEP.   triamterene-hydrochlorothiazide 37.5-25 MG capsule Commonly known as: DYAZIDE Take 1 each (1 capsule total) by mouth daily.   Vitamin D3 25 MCG (1000 UT) Caps Take by mouth.       All past medical history, surgical history, allergies, family history, immunizations andmedications were updated in the EMR today and reviewed under the history and medication portions of their EMR.     ROS: Negative, with the exception of above mentioned in HPI   Objective:  BP 117/77 (BP Location: Left Arm, Patient Position: Sitting, Cuff Size: Normal)   Pulse 80   Temp 97.7 F (36.5 C) (Temporal)   Resp 16   Ht 5\' 5"  (1.651 m)   Wt 160 lb 8 oz (72.8 kg)   SpO2 96%   BMI 26.71 kg/m  Body mass index is 26.71 kg/m. Gen: Afebrile. No acute distress.  HENT: AT. [place].  Eyes:Pupils Equal Round Reactive to light, Extraocular movements intact,  Conjunctiva without redness, discharge or icterus. Neck/lymp/endocrine: Supple,no lymphadenopathy, no thyromegaly CV: RRR Neuro:  Alert. Oriented x3 Psych: Normal affect, dress and demeanor. Normal speech. Normal thought content and judgment..    No exam data present No results found. No results found for this or any  previous visit (from the past 24 hour(s)).  Assessment/Plan: Denise Fuller is a 84 y.o. female present for OV for  Fatigue/thyroid disorder/ iron def - she was unable to tolerate oral iron, but did increase  the iron content of her diet.  - She lowered her dose of synthroid to 112 mcg for past 8 weeks. Refills on appropriate dose will be prescribed after results.  - still feeling fatigued.  - TSH - T4, free - Iron, TIBC and Ferritin Panel - f/u dependent on labs    Reviewed expectations re: course of current medical issues.  Discussed self-management of symptoms.  Outlined signs and symptoms indicating need for more acute intervention.  Patient verbalized understanding and all questions were answered.  Patient received an After-Visit Summary.   Orders Placed This Encounter  Procedures  . TSH  . T4, free  . Iron, TIBC and Ferritin Panel     Note is dictated utilizing voice recognition software. Although note has been proof read prior to signing, occasional typographical errors still can be missed. If any questions arise, please do not hesitate to call for verification.   electronically signed by:  Howard Pouch, DO  [place]

## 2019-02-11 NOTE — Patient Instructions (Addendum)
COVID vaccines!!!! COVID vaccines are starting for the community. Over 40 age bracket are now able to get vaccine.  Appointments are required and can be made beginning at 8 a.m. Once your age bracket is called (watch news and visit website below for info) appt can be made by calling [phone] and selecting option 2. Walk-ins will not be accepted.                  Clinic locations are: [facility], [place]; [facility] [address] [place]; . [place] at [facility], [address], [id number], [place].  Participants are asked to wear a face covering at vaccination sites.  Visit [email or web address] and click on the "[id number] Vaccine Info" rectangle for more information about vaccinations.    Iron-Rich Diet  Iron is a mineral that helps your body to produce hemoglobin. Hemoglobin is a protein in red blood cells that carries oxygen to your body's tissues. Eating too little iron may cause you to feel weak and tired, and it can increase your risk of infection. Iron is naturally found in many foods, and many foods have iron added to them (iron-fortified foods). You may need to follow an iron-rich diet if you do not have enough iron in your body due to certain medical conditions. The amount of iron that you need each day depends on your age, your sex, and any medical conditions you have. Follow instructions from your health care provider or a diet and nutrition specialist (dietitian) about how much iron you should eat each day. What are tips for following this plan? Reading food labels  Check food labels to see how many milligrams (mg) of iron are in each serving. Cooking  Cook foods in pots and pans that are made from iron.  Take these steps to make it easier for your body to absorb iron from certain foods: ? Soak beans overnight before cooking. ? Soak whole grains  overnight and drain them before using. ? Ferment flours before baking, such as by using yeast in bread dough. Meal planning  When you eat foods that contain iron, you should eat them with foods that are high in vitamin C. These include oranges, peppers, tomatoes, potatoes, and mango. Vitamin C helps your body to absorb iron. General information  Take iron supplements only as told by your health care provider. An overdose of iron can be life-threatening. If you were prescribed iron supplements, take them with orange juice or a vitamin C supplement.  When you eat iron-fortified foods or take an iron supplement, you should also eat foods that naturally contain iron, such as meat, poultry, and fish. Eating naturally iron-rich foods helps your body to absorb the iron that is added to other foods or contained in a supplement.  Certain foods and drinks prevent your body from absorbing iron properly. Avoid eating these foods in the same meal as iron-rich foods or with iron supplements. These foods include: ? Coffee, black tea, and red wine. ? Milk, dairy products, and foods that are high in calcium. ? Beans and soybeans. ? Whole grains. What foods should I eat? Fruits Prunes. Raisins. Eat fruits high in vitamin C, such as oranges, grapefruits, and strawberries, alongside iron-rich foods. Vegetables Spinach (cooked). Green peas. Broccoli. Fermented vegetables. Eat vegetables high in vitamin C, such as leafy greens, potatoes, bell peppers, and tomatoes, alongside iron-rich foods. Grains Iron-fortified breakfast cereal. Iron-fortified  whole-wheat bread. Enriched rice. Sprouted grains. Meats and other proteins Beef liver. Oysters. Beef. Shrimp. [place]. Chicken. [place]. Sardines. Chickpeas. Nuts. Tofu. Pumpkin seeds. Beverages Tomato juice. Fresh orange juice. Prune juice. Hibiscus tea. Fortified instant breakfast shakes. Sweets and desserts Blackstrap molasses. Seasonings and condiments Tahini.  Fermented soy sauce. Other foods Wheat germ. The items listed above may not be a complete list of recommended foods and beverages. Contact a dietitian for more information. What foods should I avoid? Grains Whole grains. Bran cereal. Bran flour. Oats. Meats and other proteins Soybeans. Products made from soy protein. Black beans. Lentils. Mung beans. Split peas. Dairy Milk. Cream. Cheese. Yogurt. Cottage cheese. Beverages Coffee. Black tea. Red wine. Sweets and desserts Cocoa. Chocolate. Ice cream. Other foods Basil. Oregano. Large amounts of parsley. The items listed above may not be a complete list of foods and beverages to avoid. Contact a dietitian for more information. Summary  Iron is a mineral that helps your body to produce hemoglobin. Hemoglobin is a protein in red blood cells that carries oxygen to your body's tissues.  Iron is naturally found in many foods, and many foods have iron added to them (iron-fortified foods).  When you eat foods that contain iron, you should eat them with foods that are high in vitamin C. Vitamin C helps your body to absorb iron.  Certain foods and drinks prevent your body from absorbing iron properly, such as whole grains and dairy products. You should avoid eating these foods in the same meal as iron-rich foods or with iron supplements. This information is not intended to replace advice given to you by your health care provider. Make sure you discuss any questions you have with your health care provider. Document Revised: 01/04/2017 Document Reviewed: 12/18/2016 Elsevier Patient Education  2020 [employer].

## 2019-02-12 ENCOUNTER — Other Ambulatory Visit: Payer: Self-pay | Admitting: Family Medicine

## 2019-02-12 DIAGNOSIS — E039 Hypothyroidism, unspecified: Secondary | ICD-10-CM

## 2019-02-12 LAB — IRON,TIBC AND FERRITIN PANEL
%SAT: 37 % (calc) (ref 16–45)
Ferritin: 39 ng/mL (ref 16–288)
Iron: 139 ug/dL (ref 45–160)
TIBC: 373 mcg/dL (calc) (ref 250–450)

## 2019-02-12 MED ORDER — LEVOTHYROXINE SODIUM 112 MCG PO TABS: ORAL_TABLET | ORAL | 3 refills | Status: DC

## 2019-03-16 DIAGNOSIS — H353112 Nonexudative age-related macular degeneration, right eye, intermediate dry stage: Secondary | ICD-10-CM | POA: Diagnosis not present

## 2019-03-16 DIAGNOSIS — H43813 Vitreous degeneration, bilateral: Secondary | ICD-10-CM | POA: Diagnosis not present

## 2019-03-16 DIAGNOSIS — H353123 Nonexudative age-related macular degeneration, left eye, advanced atrophic without subfoveal involvement: Secondary | ICD-10-CM | POA: Diagnosis not present

## 2019-03-16 DIAGNOSIS — H35423 Microcystoid degeneration of retina, bilateral: Secondary | ICD-10-CM | POA: Diagnosis not present

## 2019-04-06 DIAGNOSIS — H26493 Other secondary cataract, bilateral: Secondary | ICD-10-CM | POA: Diagnosis not present

## 2019-04-20 ENCOUNTER — Ambulatory Visit: Payer: Medicare HMO | Attending: Internal Medicine

## 2019-04-20 DIAGNOSIS — Z23 Encounter for immunization: Secondary | ICD-10-CM

## 2019-04-20 NOTE — Progress Notes (Signed)
   Covid-19 Vaccination Clinic  Name:  Denise Fuller    MRN: [id number] DOB: 1935/11/10  04/20/2019  [name] was observed post Covid-19 immunization for 15 minutes without incident. She was provided with Vaccine Information Sheet and instruction to access the V-Safe system.   [name] was instructed to call 911 with any severe reactions post vaccine: [facility] Difficulty breathing  . Swelling of face and throat  . A fast heartbeat  . A bad rash all over body  . Dizziness and weakness   Immunizations Administered    Name Date Dose VIS Date Route   Pfizer COVID-19 Vaccine 04/20/2019  2:13 PM 0.3 mL 01/16/2019 Intramuscular   Manufacturer: [place]   Lot: [id number]   [place]: [id number]

## 2019-05-13 ENCOUNTER — Ambulatory Visit: Payer: Medicare HMO

## 2019-05-25 ENCOUNTER — Ambulatory Visit: Payer: Medicare HMO | Attending: Internal Medicine

## 2019-05-25 DIAGNOSIS — Z23 Encounter for immunization: Secondary | ICD-10-CM

## 2019-05-25 NOTE — Progress Notes (Signed)
   Covid-19 Vaccination Clinic  Name:  OLEEN OKOLI    MRN: [id number] DOB: May 06, 1935  05/25/2019  Ms. Wanninger was observed post Covid-19 immunization for 15 minutes without incident. She was provided with Vaccine Information Sheet and instruction to access the V-Safe system.   Ms. [place]pool was instructed to call 911 with any severe reactions post vaccine: [facility] Difficulty breathing  . Swelling of face and throat  . A fast heartbeat  . A bad rash all over body  . Dizziness and weakness   Immunizations Administered    Name Date Dose VIS Date Route   Pfizer COVID-19 Vaccine 05/25/2019  8:53 AM 0.3 mL 04/01/2018 Intramuscular   Manufacturer: [place]   Lot: [id number]   [place]: [id number]

## 2019-06-22 ENCOUNTER — Encounter: Payer: Self-pay | Admitting: Family Medicine

## 2019-06-22 ENCOUNTER — Ambulatory Visit (INDEPENDENT_AMBULATORY_CARE_PROVIDER_SITE_OTHER): Payer: Medicare HMO | Admitting: Family Medicine

## 2019-06-22 ENCOUNTER — Other Ambulatory Visit: Payer: Self-pay

## 2019-06-22 ENCOUNTER — Ambulatory Visit (INDEPENDENT_AMBULATORY_CARE_PROVIDER_SITE_OTHER)
Admission: RE | Admit: 2019-06-22 | Discharge: 2019-06-22 | Disposition: A | Discharge status: Home / Self Care | Payer: Medicare HMO | Source: Ambulatory Visit | Attending: Family Medicine | Admitting: Family Medicine

## 2019-06-22 VITALS — BP 124/83 | HR 76 | Temp 97.5°F | Resp 18 | Ht 65.0 in | Wt 164.0 lb

## 2019-06-22 DIAGNOSIS — R002 Palpitations: Secondary | ICD-10-CM

## 2019-06-22 DIAGNOSIS — G8929 Other chronic pain: Secondary | ICD-10-CM

## 2019-06-22 DIAGNOSIS — R748 Abnormal levels of other serum enzymes: Secondary | ICD-10-CM | POA: Diagnosis not present

## 2019-06-22 DIAGNOSIS — E039 Hypothyroidism, unspecified: Secondary | ICD-10-CM | POA: Diagnosis not present

## 2019-06-22 DIAGNOSIS — M47816 Spondylosis without myelopathy or radiculopathy, lumbar region: Secondary | ICD-10-CM | POA: Diagnosis not present

## 2019-06-22 DIAGNOSIS — M545 Low back pain: Secondary | ICD-10-CM

## 2019-06-22 DIAGNOSIS — R5383 Other fatigue: Secondary | ICD-10-CM

## 2019-06-22 DIAGNOSIS — R3129 Other microscopic hematuria: Secondary | ICD-10-CM | POA: Diagnosis not present

## 2019-06-22 DIAGNOSIS — R42 Dizziness and giddiness: Secondary | ICD-10-CM

## 2019-06-22 DIAGNOSIS — R531 Weakness: Secondary | ICD-10-CM | POA: Diagnosis not present

## 2019-06-22 LAB — CBC WITH DIFFERENTIAL/PLATELET
Basophils Absolute: 0 10*3/uL (ref 0.0–0.1)
Basophils Relative: 0.2 % (ref 0.0–3.0)
Eosinophils Absolute: 0.2 10*3/uL (ref 0.0–0.7)
Eosinophils Relative: 2.6 % (ref 0.0–5.0)
HCT: 39.2 % (ref 36.0–46.0)
Hemoglobin: 12.9 g/dL (ref 12.0–15.0)
Lymphocytes Relative: 29.4 % (ref 12.0–46.0)
Lymphs Abs: 2 10*3/uL (ref 0.7–4.0)
MCHC: 33 g/dL (ref 30.0–36.0)
MCV: 91.2 fl (ref 78.0–100.0)
Monocytes Absolute: 0.7 10*3/uL (ref 0.1–1.0)
Monocytes Relative: 10.4 % (ref 3.0–12.0)
Neutro Abs: 3.9 10*3/uL (ref 1.4–7.7)
Neutrophils Relative %: 57.4 % (ref 43.0–77.0)
Platelets: 279 10*3/uL (ref 150.0–400.0)
RBC: 4.29 Mil/uL (ref 3.87–5.11)
RDW: 13.8 % (ref 11.5–15.5)
WBC: 6.8 10*3/uL (ref 4.0–10.5)

## 2019-06-22 LAB — COMPREHENSIVE METABOLIC PANEL
ALT: 14 U/L (ref 0–35)
AST: 18 U/L (ref 0–37)
Albumin: 4 g/dL (ref 3.5–5.2)
Alkaline Phosphatase: 179 U/L — ABNORMAL HIGH (ref 39–117)
BUN: 17 mg/dL (ref 6–23)
CO2: 29 mEq/L (ref 19–32)
Calcium: 9 mg/dL (ref 8.4–10.5)
Chloride: 101 mEq/L (ref 96–112)
Creatinine, Ser: 0.78 mg/dL (ref 0.40–1.20)
GFR: 70.79 mL/min (ref >60.00)
Glucose, Bld: 86 mg/dL (ref 70–99)
Potassium: 4 mEq/L (ref 3.5–5.1)
Sodium: 137 mEq/L (ref 135–145)
Total Bilirubin: 0.4 mg/dL (ref 0.2–1.2)
Total Protein: 6.4 g/dL (ref 6.0–8.3)

## 2019-06-22 MED ORDER — ESCITALOPRAM OXALATE 5 MG PO TABS: 5.0000 mg | ORAL_TABLET | Freq: Every day | ORAL | 1 refills | Status: DC

## 2019-06-22 NOTE — Patient Instructions (Addendum)
Restart zyrtec 1 tab before bed for allergies.  We will call you with lab and image results. Please have xray at [place] location near the [place].    Start lexapro once daily- can be taken in the day or before bed.   I have referred you to cardiology as well for further evaluation of palpitations, dizziness and fatigue.

## 2019-06-22 NOTE — Progress Notes (Signed)
Denise Fuller , 1935/10/02, 84 y.o., female MRN: [id number] Patient Care Team    Relationship Specialty Notifications Start End  Ma Hillock, DO PCP - General Family Medicine  01/10/16   Otelia Sergeant, OD Referring Physician   01/10/16    Comment: ophth- macular degeneration  [facility]  04/26/17     Chief Complaint  Patient presents with  . Fatigue    Pt has had severe fatigue. Tried to take the iron supplements and was too itchy.      Subjective: Denise Fuller is a 84 y.o. female present for continued fatigue.  Patient describes her fatigue as "severe.  "She had a history of low iron, was unable to tolerate iron supplements but has been increasing iron in her diet.  Her iron has been within normal limits since.  She has a history of hypothyroidism.  Doses have been altered of her levothyroxine, with normal thyroid function last 02/11/2019.  Liver and kidney function, along with electrolytes have been normal last fall 2020 with an elevated alk phos.  Her calcium levels have been normal.  Blood cell counts have been normal.  A1c normal.  Patient reports he supplements with 1000 units of vitamin D daily. She does endorse having some stress.  She does not sleep as well as she would like.  She was unable to tolerate trazodone that she reports she did not like the way it made her feel and it did not work well for her.  She does admit she thinks she does not sleep as well because she is worried about needing to get up with her husband to help care for him. She also endorses low back discomfort, generalized fatigue, lightheadedness and occasional palpitations.  Her son has been treated for atrial fibrillation.  Her daughter was thought to possibly have A. Fib that caused her heart problems.  Her father has a history of aneurysms of the brain. Patient has a history of Mnire's disease and on HCTZ.  Blood pressure has been well controlled on HCTZ/lisinopril.  She takes melatonin and  Zyrtec nightly.  Depression screen [facility] 2/9 10/15/2018 04/10/2018 04/26/2017 04/23/2016 01/10/2016  Decreased Interest 0 0 0 0 0  Down, Depressed, Hopeless 0 0 0 0 0  PHQ - 2 Score 0 0 0 0 0  Altered sleeping - 0 - - -  Tired, decreased energy - 0 - - -  Change in appetite - 0 - - -  Feeling bad or failure about yourself  - 0 - - -  Trouble concentrating - 0 - - -  Moving slowly or fidgety/restless - 0 - - -  Suicidal thoughts - 0 - - -  PHQ-9 Score - 0 - - -  Difficult doing work/chores - Not difficult at all - - -    Allergies  Allergen Reactions  . Doxycycline     GI upset  . Keflex [Cephalexin] Hives and Itching  . Iron Rash   Social History   Social History Narrative   Retired. Married to [place]. They have 3 children [name])   [place] from [place] 05/2014.    Has 2 older dogs.    Retired.    Drinks caffeine, takes herbal remedies and dialy vitamin.   Wears her seatbelt, smoke detector in the home   Wears dentures, no assistive devices for walking - independent.    Feels safe in her relationships.    Past Medical History:  Diagnosis Date  . Bartonella infection   . BCC (basal cell carcinoma of skin) 1997   nose  . Chickenpox   . Chronic venous insufficiency   . Diverticulosis   . Elevated alkaline phosphatase level   . Elevated LDH   . Environmental allergies   . GAD (generalized anxiety disorder)   . Groin abscess    culture negative, doxy resolved abscess  . Hepatitis A   . Hyperlipidemia   . Hypertension   . Hypothyroidism   . Lymphadenopathy, cervical    chronic per pt. (left)  . Macular degeneration   . Meniere disease   . Thyroid disease    Past Surgical History:  Procedure Laterality Date  . ABDOMINAL HYSTERECTOMY  1991  . APPENDECTOMY  1967  . BASAL CELL CARCINOMA EXCISION  1997   nose  . BLEPHAROPLASTY    . CATARACT EXTRACTION W/ INTRAOCULAR LENS IMPLANT Bilateral Oct and Nov 2018  . CHOLECYSTECTOMY  1967   Family History    Problem Relation Age of Onset  . Arthritis Mother   . Heart disease Mother   . Arthritis Father   . Aneurysm Father 52       brain  . Thyroid disease Daughter     All past medical history, surgical history, allergies, family history, immunizations andmedications were updated in the EMR today and reviewed under the history and medication portions of their EMR.     ROS: Negative, with the exception of above mentioned in HPI   Objective:  BP 124/83 (BP Location: Right Arm, Patient Position: Sitting, Cuff Size: Normal)   Pulse 76   Temp (!) 97.5 F (36.4 C) (Temporal)   Resp 18   Ht '5\' 5"'$  (1.651 m)   Wt 164 lb (74.4 kg)   SpO2 98%   BMI 27.29 kg/m  Body mass index is 27.29 kg/m. Gen: Afebrile. No acute distress.  Nontoxic in presentation, well-developed, well-nourished, very pleasant female. HENT: AT. [place].  MMM.  No cough.  Mild hoarseness present.   Eyes:Pupils Equal Round Reactive to light, Extraocular movements intact,  Conjunctiva without redness, discharge or icterus. Neck/lymp/endocrine: Supple, no lymphadenopathy, no thyromegaly CV: RRR no murmur, no edema Chest: CTAB, no wheeze or crackles Skin: no rashes, purpura or petechiae.  Neuro:  Normal gait. PERLA. EOMi. Alert. Oriented x3  Psych: Normal affect, dress and demeanor. Normal speech. Normal thought content and judgment.   No exam data present No results found. No results found for this or any previous visit (from the past 24 hour(s)).  Assessment/Plan: Denise Fuller is a 84 y.o. female present for OV for  Hypothyroidism, unspecified type Recheck thyroid levels today.  - TSH - Thyroid peroxidase antibody  Palpitations/lightheadedness/weakness/fatigue Discussed referral to cardiology with her today and she is agreeable to rule out cardiac cause of her continued fatigue.  She does have palpitations and a family history of A. fib.  Would like to rule out paroxysmal arrhythmia versus cardiac cause of her  worsening fatigue over the last 6 months - TSH - Thyroid peroxidase antibody - CBC w/Diff - Comp Met (CMET) - Ambulatory referral to Cardiology  Weakness generalized/lightheadedness/elevated alk phos/fatigue Possibly multifactorial in cause.  However she has had feelings of worsening fatigue for over 6 months and now significantly worsening for her.  We will check vitamin levels, iron levels, thyroid levels and electrolyte levels for her today to rule out other causes.  SPEP for low back pain with generalized fatigue to be complete. - Vitamin  D (25 hydroxy) -PTH, intact and calcium - B12 - Iron, TIBC and Ferritin Panel - TSH - Thyroid peroxidase antibody - CBC w/Diff - Comp Met (CMET) - Protein,Total and Electrophor w/IFE-([facility])  Chronic bilateral low back pain without sciatica Patient has been complaining of low back pain that was mild over the last 6 months.  Her lower lumbar spine continues to cause her discomfort. SPEP ordered to be safe with low back pain and generalized weakness/fatigue. - DG Lumbar Spine Complete; Future - Protein,Total and Electrophor w/IFE-([facility]) - Consider Mobic versus Voltaren depending upon x-ray results and kidney function  Anxiety: Discussed anxiety manifesting as palpitations and can even cause increased fatigue.  She is agreeable to try low-dose Lexapro.  Hematuria:  Rpt urinalysis to ensure hematuria resolved.  Follow-up dependent upon laboratory and imaging results.   Reviewed expectations re: course of current medical issues.  Discussed self-management of symptoms.  Outlined signs and symptoms indicating need for more acute intervention.  Patient verbalized understanding and all questions were answered.  Patient received an After-Visit Summary.   Orders Placed This Encounter  Procedures  . Vitamin D (25 hydroxy)  . B12  . Iron, TIBC and Ferritin Panel  . TSH  . Thyroid peroxidase antibody  . CBC w/Diff  . Comp Met (CMET)    Meds ordered this encounter  Medications  . escitalopram (LEXAPRO) 5 MG tablet    Sig: Take 1 tablet (5 mg total) by mouth daily.    Dispense:  90 tablet    Refill:  1   Referral Orders  No referral(s) requested today     Note is dictated utilizing voice recognition software. Although note has been proof read prior to signing, occasional typographical errors still can be missed. If any questions arise, please do not hesitate to call for verification.   electronically signed by:  Howard Pouch, DO  [place]

## 2019-06-23 DIAGNOSIS — G8929 Other chronic pain: Secondary | ICD-10-CM

## 2019-06-23 DIAGNOSIS — M545 Low back pain, unspecified: Secondary | ICD-10-CM | POA: Insufficient documentation

## 2019-06-23 DIAGNOSIS — R748 Abnormal levels of other serum enzymes: Secondary | ICD-10-CM | POA: Insufficient documentation

## 2019-06-23 HISTORY — DX: Other chronic pain: G89.29

## 2019-06-23 HISTORY — DX: Low back pain, unspecified: M54.50

## 2019-06-23 LAB — TSH: TSH: 0.19 u[IU]/mL — ABNORMAL LOW (ref 0.35–4.50)

## 2019-06-23 LAB — VITAMIN B12: Vitamin B-12: 285 pg/mL (ref 211–911)

## 2019-06-23 LAB — VITAMIN D 25 HYDROXY (VIT D DEFICIENCY, FRACTURES): VITD: 63.92 ng/mL (ref 30.00–100.00)

## 2019-06-24 ENCOUNTER — Telehealth: Payer: Self-pay | Admitting: Family Medicine

## 2019-06-24 DIAGNOSIS — R748 Abnormal levels of other serum enzymes: Secondary | ICD-10-CM

## 2019-06-24 DIAGNOSIS — E039 Hypothyroidism, unspecified: Secondary | ICD-10-CM

## 2019-06-24 LAB — IRON,TIBC AND FERRITIN PANEL
%SAT: 31 % (calc) (ref 16–45)
Ferritin: 26 ng/mL (ref 16–288)
Iron: 109 ug/dL (ref 45–160)
TIBC: 353 mcg/dL (calc) (ref 250–450)

## 2019-06-24 LAB — PROTEIN,TOTAL AND ELECTROPHOR W/IFE
Albumin ELP: 3.7 g/dL — ABNORMAL LOW (ref 3.8–4.8)
Alpha 1: 0.3 g/dL (ref 0.2–0.3)
Alpha 2: 0.8 g/dL (ref 0.5–0.9)
Beta 2: 0.3 g/dL (ref 0.2–0.5)
Beta Globulin: 0.4 g/dL (ref 0.4–0.6)
Gamma Globulin: 0.9 g/dL (ref 0.8–1.7)
Total Protein: 6.4 g/dL (ref 6.1–8.1)

## 2019-06-24 LAB — PTH, INTACT AND CALCIUM
Calcium: 9.4 mg/dL (ref 8.6–10.4)
PTH: 41 pg/mL (ref 14–64)

## 2019-06-24 LAB — THYROID PEROXIDASE ANTIBODY: Thyroperoxidase Ab SerPl-aCnc: 9 IU/mL — ABNORMAL HIGH (ref <9)

## 2019-06-24 MED ORDER — CYANOCOBALAMIN 3000 MCG/ML SL LIQD: 1500.0000 ug | Freq: Every day | SUBLINGUAL | 5 refills | Status: DC

## 2019-06-24 NOTE — Telephone Encounter (Signed)
Pt was called and was on her way out the door with her daughter and asked if we could call back tomorrow. Pt advised we would call her back.

## 2019-06-24 NOTE — Telephone Encounter (Signed)
Pt was called and given all information. She is taking Aleve BID and Tylenol qd at noon for her back pain, wanted to make sure this was not the cause of the increase in labs. She was scheduled for labs and F/U appt.

## 2019-06-24 NOTE — Telephone Encounter (Signed)
Please inform patient:  We do not have all of her labs back yet but I did want to address what we do have back. -Her kidney function and electrolytes are normal. -Parathyroid, calcium and vitamin D are normal. -Her B12 is significantly low at 285.  This can cause fatigue.  I have called in a supplement for her to start daily it is a sublingual liquid-please explain proper use to her please.  This was called into [facility]local pharmacy.  If insurance does not cover she can pick up over-the-counter B12 1000--1500 mcg sublingual solution.  -Her alkaline phosphatase, which is an enzyme found in the liver, kidneys and bone continues to slowly elevate.  We need to figure out where the elevation is coming from.  I have ordered additional lab work and a urinalysis for collection this week by lab appointment only.  Provider follow-up following week.  -Her thyroid is over replaced again. -I would like her to decrease her dose by staying on the same levothyroxine 112 mcg pill that she currently has.  She is to take 1 daily 6 days a week and on Sundays only a half of a tab. -I would also recommend she discontinue the biotin tabs for now.  Her x-ray showed evidence of arthritis of her lumbar spine and an old compression fracture of her lower thoracic spine.  We will discuss in more detail at her upcoming appointment, and discussed options for treatment.   Lab appointment this week for additional blood work and urinalysis. Provider follow-up next week to discuss all results and next steps since there is a great deal to consider and explain.

## 2019-06-29 ENCOUNTER — Other Ambulatory Visit: Payer: Self-pay

## 2019-06-29 ENCOUNTER — Ambulatory Visit (INDEPENDENT_AMBULATORY_CARE_PROVIDER_SITE_OTHER): Payer: Medicare HMO | Admitting: Family Medicine

## 2019-06-29 DIAGNOSIS — R748 Abnormal levels of other serum enzymes: Secondary | ICD-10-CM

## 2019-06-29 DIAGNOSIS — R531 Weakness: Secondary | ICD-10-CM | POA: Diagnosis not present

## 2019-06-29 DIAGNOSIS — R3129 Other microscopic hematuria: Secondary | ICD-10-CM | POA: Diagnosis not present

## 2019-06-29 LAB — PROTIME-INR
INR: 1 ratio (ref 0.8–1.0)
Prothrombin Time: 11.4 s (ref 9.6–13.1)

## 2019-06-29 LAB — GAMMA GT: GGT: 12 U/L (ref 7–51)

## 2019-06-30 ENCOUNTER — Other Ambulatory Visit: Payer: Self-pay | Admitting: Family Medicine

## 2019-06-30 DIAGNOSIS — I10 Essential (primary) hypertension: Secondary | ICD-10-CM

## 2019-07-01 LAB — URINALYSIS W MICROSCOPIC + REFLEX CULTURE
Bacteria, UA: NONE SEEN /HPF
Bilirubin Urine: NEGATIVE
Glucose, UA: NEGATIVE
Hgb urine dipstick: NEGATIVE
Hyaline Cast: NONE SEEN /LPF
Ketones, ur: NEGATIVE
Nitrites, Initial: NEGATIVE
Protein, ur: NEGATIVE
RBC / HPF: NONE SEEN /HPF (ref 0–2)
Specific Gravity, Urine: 1.01 (ref 1.001–1.03)
pH: 6.5 (ref 5.0–8.0)

## 2019-07-01 LAB — CULTURE INDICATED

## 2019-07-01 LAB — URINE CULTURE
MICRO NUMBER:: 10513691
SPECIMEN QUALITY:: ADEQUATE

## 2019-07-10 ENCOUNTER — Other Ambulatory Visit: Payer: Self-pay

## 2019-07-10 ENCOUNTER — Encounter: Payer: Self-pay | Admitting: Cardiology

## 2019-07-10 ENCOUNTER — Ambulatory Visit: Payer: Medicare HMO | Admitting: Cardiology

## 2019-07-10 VITALS — BP 114/68 | HR 88 | Ht 65.0 in | Wt 164.0 lb

## 2019-07-10 DIAGNOSIS — E782 Mixed hyperlipidemia: Secondary | ICD-10-CM

## 2019-07-10 DIAGNOSIS — R0600 Dyspnea, unspecified: Secondary | ICD-10-CM

## 2019-07-10 DIAGNOSIS — R0609 Other forms of dyspnea: Secondary | ICD-10-CM | POA: Insufficient documentation

## 2019-07-10 DIAGNOSIS — I10 Essential (primary) hypertension: Secondary | ICD-10-CM

## 2019-07-10 DIAGNOSIS — R002 Palpitations: Secondary | ICD-10-CM

## 2019-07-10 HISTORY — DX: Other forms of dyspnea: R06.09

## 2019-07-10 HISTORY — DX: Dyspnea, unspecified: R06.00

## 2019-07-10 NOTE — Patient Instructions (Signed)
Medication Instructions:  Your physician recommends that you continue on your current medications as directed. Please refer to the Current Medication list given to you today.  *If you need a refill on your cardiac medications before your next appointment, please call your pharmacy*   Lab Work: None.  If you have labs (blood work) drawn today and your tests are completely normal, you will receive your results only by: Marland Kitchen MyChart Message (if you have MyChart) OR . A paper copy in the mail If you have any lab test that is abnormal or we need to change your treatment, we will call you to review the results.   Testing/Procedures: Your physician has requested that you have an echocardiogram. Echocardiography is a painless test that uses sound waves to create images of your heart. It provides your doctor with information about the size and shape of your heart and how well your heart's chambers and valves are working. This procedure takes approximately one hour. There are no restrictions for this procedure.     Follow-Up: At [facility], you and your health needs are our priority.  As part of our continuing mission to provide you with exceptional heart care, we have created designated Provider Care Teams.  These Care Teams include your primary Cardiologist (physician) and Advanced Practice Providers (APPs -  Physician Assistants and Nurse Practitioners) who all work together to provide you with the care you need, when you need it.  We recommend signing up for the patient portal called "MyChart".  Sign up information is provided on this After Visit Summary.  MyChart is used to connect with patients for Virtual Visits (Telemedicine).  Patients are able to view lab/test results, encounter notes, upcoming appointments, etc.  Non-urgent messages can be sent to your provider as well.   To learn more about what you can do with MyChart, go to [email or web address].    Your next appointment:   6  week(s)  The format for your next appointment:   In Person  Provider:   Jenne Campus, MD   Other Instructions   Echocardiogram An echocardiogram is a procedure that uses painless sound waves (ultrasound) to produce an image of the heart. Images from an echocardiogram can provide important information about:  Signs of coronary artery disease (CAD).  Aneurysm detection. An aneurysm is a weak or damaged part of an artery wall that bulges out from the normal force of blood pumping through the body.  Heart size and shape. Changes in the size or shape of the heart can be associated with certain conditions, including heart failure, aneurysm, and CAD.  Heart muscle function.  Heart valve function.  Signs of a past heart attack.  Fluid buildup around the heart.  Thickening of the heart muscle.  A tumor or infectious growth around the heart valves. Tell a health care provider about:  Any allergies you have.  All medicines you are taking, including vitamins, herbs, eye drops, creams, and over-the-counter medicines.  Any blood disorders you have.  Any surgeries you have had.  Any medical conditions you have.  Whether you are pregnant or may be pregnant. What are the risks? Generally, this is a safe procedure. However, problems may occur, including:  Allergic reaction to dye (contrast) that may be used during the procedure. What happens before the procedure? No specific preparation is needed. You may eat and drink normally. What happens during the procedure?   An IV tube may be inserted into one of your veins.  You may  receive contrast through this tube. A contrast is an injection that improves the quality of the pictures from your heart.  A gel will be applied to your chest.  A wand-like tool (transducer) will be moved over your chest. The gel will help to transmit the sound waves from the transducer.  The sound waves will harmlessly bounce off of your heart to  allow the heart images to be captured in real-time motion. The images will be recorded on a computer. The procedure may vary among health care providers and hospitals. What happens after the procedure?  You may return to your normal, everyday life, including diet, activities, and medicines, unless your health care provider tells you not to do that. Summary  An echocardiogram is a procedure that uses painless sound waves (ultrasound) to produce an image of the heart.  Images from an echocardiogram can provide important information about the size and shape of your heart, heart muscle function, heart valve function, and fluid buildup around your heart.  You do not need to do anything to prepare before this procedure. You may eat and drink normally.  After the echocardiogram is completed, you may return to your normal, everyday life, unless your health care provider tells you not to do that. This information is not intended to replace advice given to you by your health care provider. Make sure you discuss any questions you have with your health care provider. Document Revised: 05/15/2018 Document Reviewed: 02/25/2016 Elsevier Patient Education  [place].

## 2019-07-10 NOTE — Progress Notes (Signed)
Cardiology Consultation:    Date:  07/10/2019   ID:  Denise Fuller, DOB 1935-08-19, MRN [id number]  PCP:  Ma Hillock, DO  Cardiologist:  Jenne Campus, MD   Referring MD: Ma Hillock, DO   Chief Complaint  Patient presents with  . New Patient (Initial Visit)    Palpitations  Weak and tired  History of Present Illness:    Denise Fuller is a 84 y.o. female who is being seen today for the evaluation of weakness and tiredness at the request of [name], [name], DO.  She is a super sweet and very energetic lady she was sent to [place] because of fatigue and tiredness she says about a year or 2 gradually she is getting more tired.  She still able to take care of activities of daily living take care of her household as well as her husband who does have myasthenia gravis.  But she said anytime she does think she can very fatigued and tired.  Does have some shortness of breath with exertion denies having chest pain.  Does have some palpitations happened abruptly without any warning lasting for few seconds she has this all her life.  She tried to walk on a regular basis however again gets tired.  She thinks she simply getting older.  Her primary care physician did an excellent work-up trying find what the problem is and so far she was identified do not bite vitamin B12 deficiency is some minor abnormality but overall no clear-cut indications for her symptoms. She never had any heart trouble Does have multiple family members having some issue including atrial fibrillation and CVA.   Past Medical History:  Diagnosis Date  . Bartonella infection   . BCC (basal cell carcinoma of skin) 1997   nose  . Chickenpox   . Chronic venous insufficiency   . Diverticulosis   . Elevated alkaline phosphatase level   . Elevated LDH   . Environmental allergies   . GAD (generalized anxiety disorder)   . Groin abscess    culture negative, doxy resolved abscess  . Hepatitis A   . Hyperlipidemia   .  Hypertension   . Hypothyroidism   . Lymphadenopathy, cervical    chronic per pt. (left)  . Macular degeneration   . Meniere disease   . Thyroid disease     Past Surgical History:  Procedure Laterality Date  . ABDOMINAL HYSTERECTOMY  1991  . APPENDECTOMY  1967  . BASAL CELL CARCINOMA EXCISION  1997   nose  . BLEPHAROPLASTY    . CATARACT EXTRACTION W/ INTRAOCULAR LENS IMPLANT Bilateral Oct and Nov 2018  . CHOLECYSTECTOMY  1967    Current Medications: Current Meds  Medication Sig  . aspirin EC 81 MG tablet Take 81 mg by mouth daily.  . calcium citrate-vitamin D (CITRACAL+D) 315-200 MG-UNIT tablet Take 1 tablet by mouth 2 (two) times daily.  . cetirizine (ZYRTEC) 10 MG tablet Take 10 mg by mouth as needed.   . Cholecalciferol (VITAMIN D3) 1000 units CAPS Take by mouth. 2,000 units daily  . Cyanocobalamin 3000 MCG/ML LIQD Place 1,500 mcg under the tongue daily.  . DHA-EPA-Vitamin E (OMEGA-3 COMPLEX PO) Take 360 mg by mouth daily.  . DUREZOL 0.05 % EMUL INSTILL 1 DROP INTO LEFT EYE 3 TIMES A DAY  . ELDERBERRY PO Take by mouth.  . Flaxseed, Linseed, (FLAX SEED OIL PO) Take 1,300 mg by mouth 2 (two) times daily.  Marland Kitchen glucosamine-chondroitin 500-400 MG tablet  Take 1 tablet by mouth 3 (three) times daily.  Marland Kitchen levothyroxine (SYNTHROID) 112 MCG tablet 1 tablet daily on an empty stomach  . lisinopril (ZESTRIL) 5 MG tablet TAKE 1 TABLET EVERY DAY  . Misc Natural Products (ELDERBERRY ZINC/VIT C/IMMUNE MT) Use as directed in the mouth or throat.  Vladimir Faster Glycol-Propyl Glycol (SYSTANE OP) Apply to eye.  Marland Kitchen PROLENSA 0.07 % SOLN PUT 1 DROP INTO LEFT EYE AT BEDTIME  . triamterene-hydrochlorothiazide (DYAZIDE) 37.5-25 MG capsule TAKE 1 CAPSULE EVERY DAY     Allergies:   Doxycycline, Keflex [cephalexin], and Iron   Social History   Socioeconomic History  . Marital status: Married    Spouse name: [name]  . Number of children: 3  . Years of education: Not on file  . Highest education level:  Not on file  Occupational History  . Occupation: retired  Tobacco Use  . Smoking status: Never Smoker  . Smokeless tobacco: Never Used  Substance and Sexual Activity  . Alcohol use: No  . Drug use: No  . Sexual activity: Yes    Partners: Male    Comment: married  Other Topics Concern  . Not on file  Social History Narrative   Retired. Married to [place]. They have 3 children [name])   [place] from [place] 05/2014.    Has 2 older dogs.    Retired.    Drinks caffeine, takes herbal remedies and dialy vitamin.   Wears her seatbelt, smoke detector in the home   Wears dentures, no assistive devices for walking - independent.    Feels safe in her relationships.    Social Determinants of Health   Financial Resource Strain:   . Difficulty of Paying Living Expenses:   Food Insecurity:   . Worried About Charity fundraiser in the Last Year:   . Arboriculturist in the Last Year:   Transportation Needs:   . Film/video editor (Medical):   Marland Kitchen Lack of Transportation (Non-Medical):   Physical Activity:   . Days of Exercise per Week:   . Minutes of Exercise per Session:   Stress:   . Feeling of Stress :   Social Connections:   . Frequency of Communication with Friends and Family:   . Frequency of Social Gatherings with Friends and Family:   . Attends Religious Services:   . Active Member of Clubs or Organizations:   . Attends Archivist Meetings:   Marland Kitchen Marital Status:      Family History: The patient's family history includes Aneurysm (age of onset: 28) in her father; Arthritis in her father and mother; Atrial fibrillation in her son; Heart disease in her mother; Stroke in her daughter; Thyroid disease in her daughter. ROS:   Please see the history of present illness.    All 14 point review of systems negative except as described per history of present illness.  EKGs/Labs/Other Studies Reviewed:    The following studies were reviewed today:   EKG:  EKG  is  ordered today.  The ekg ordered today demonstrates normal sinus rhythm left axis deviation, normal P interval, nonspecific ST segment changes.  Recent Labs: 06/22/2019: ALT 14; BUN 17; Creatinine, Ser 0.78; Hemoglobin 12.9; Platelets 279.0; Potassium 4.0; Sodium 137; TSH 0.19  Recent Lipid Panel    Component Value Date/Time   CHOL 197 10/15/2018 0959   TRIG 108.0 10/15/2018 0959   HDL 64.60 10/15/2018 0959   CHOLHDL 3 10/15/2018 0959   VLDL  21.6 10/15/2018 0959   LDLCALC 111 (H) 10/15/2018 0959    Physical Exam:    VS:  BP 114/68   Pulse 88   Ht 5\' 5"  (1.651 m)   Wt 164 lb (74.4 kg)   SpO2 99%   BMI 27.29 kg/m     Wt Readings from Last 3 Encounters:  07/10/19 164 lb (74.4 kg)  06/22/19 164 lb (74.4 kg)  02/11/19 160 lb 8 oz (72.8 kg)     GEN:  Well nourished, well developed in no acute distress HEENT: Normal NECK: No JVD; No carotid bruits LYMPHATICS: No lymphadenopathy CARDIAC: RRR, no murmurs, no rubs, no gallops RESPIRATORY:  Clear to auscultation without rales, wheezing or rhonchi  ABDOMEN: Soft, non-tender, non-distended MUSCULOSKELETAL:  No edema; No deformity  SKIN: Warm and dry NEUROLOGIC:  Alert and oriented x 3 PSYCHIATRIC:  Normal affect   ASSESSMENT:    1. Essential hypertension   2. Dyspnea on exertion   3. Mixed hyperlipidemia   4. Palpitations    PLAN:    In order of problems listed above:  1. Fatigue tiredness: Difficult symptoms to work on.  She required some cardiac work-up to make sure that this is not related to her heart.  I offer her echocardiogram to assess left ventricle ejection fractions accepted my offer.  We did talk also about potentially doing stress testing to see if her fatigue and tiredness could be anginal equivalent.  She declined at that she says she does not want to do it.  She states that she is 84 years old and she is doing fine overall. Dyspnea on exertion: Echocardiogram will be done Mixed dyslipidemia: I will not  change or add any medication right now until will be more clear about her symptomatology. B12 deficiency.  I think she can benefit from intramuscular injection to see if she feels any better with that.  Overall is a fairly complicated situation.  I will check her ejection fraction make sure it is fine.  She refused my offer to have a stress test done and honestly based on her symptomatology her further overall level of suspicion that this is coronary artery disease manifestation.  I see her back in my office in about 6 weeks and we will continue this discussion   Medication Adjustments/Labs and Tests Ordered: Current medicines are reviewed at length with the patient today.  Concerns regarding medicines are outlined above.  No orders of the defined types were placed in this encounter.  No orders of the defined types were placed in this encounter.   Signed, Park Liter, MD, [facility]. 07/10/2019 2:17 PM    [place]

## 2019-07-13 ENCOUNTER — Other Ambulatory Visit: Payer: Self-pay

## 2019-07-13 ENCOUNTER — Encounter: Payer: Self-pay | Admitting: Family Medicine

## 2019-07-13 ENCOUNTER — Ambulatory Visit (INDEPENDENT_AMBULATORY_CARE_PROVIDER_SITE_OTHER): Payer: Medicare HMO | Admitting: Family Medicine

## 2019-07-13 VITALS — BP 108/74 | HR 84 | Temp 97.6°F | Resp 18 | Ht 65.0 in | Wt 164.0 lb

## 2019-07-13 DIAGNOSIS — R778 Other specified abnormalities of plasma proteins: Secondary | ICD-10-CM

## 2019-07-13 DIAGNOSIS — R0600 Dyspnea, unspecified: Secondary | ICD-10-CM

## 2019-07-13 DIAGNOSIS — E538 Deficiency of other specified B group vitamins: Secondary | ICD-10-CM

## 2019-07-13 DIAGNOSIS — F411 Generalized anxiety disorder: Secondary | ICD-10-CM | POA: Diagnosis not present

## 2019-07-13 DIAGNOSIS — M545 Low back pain, unspecified: Secondary | ICD-10-CM

## 2019-07-13 DIAGNOSIS — R531 Weakness: Secondary | ICD-10-CM | POA: Diagnosis not present

## 2019-07-13 DIAGNOSIS — R5383 Other fatigue: Secondary | ICD-10-CM | POA: Diagnosis not present

## 2019-07-13 DIAGNOSIS — R42 Dizziness and giddiness: Secondary | ICD-10-CM

## 2019-07-13 DIAGNOSIS — R748 Abnormal levels of other serum enzymes: Secondary | ICD-10-CM | POA: Diagnosis not present

## 2019-07-13 DIAGNOSIS — R002 Palpitations: Secondary | ICD-10-CM

## 2019-07-13 DIAGNOSIS — G8929 Other chronic pain: Secondary | ICD-10-CM

## 2019-07-13 DIAGNOSIS — E039 Hypothyroidism, unspecified: Secondary | ICD-10-CM

## 2019-07-13 DIAGNOSIS — R0609 Other forms of dyspnea: Secondary | ICD-10-CM

## 2019-07-13 MED ORDER — CYANOCOBALAMIN 1000 MCG/ML IJ SOLN
1000.0000 ug | Freq: Once | INTRAMUSCULAR | Status: AC
  Administered 2019-07-13: 1000 ug via INTRAMUSCULAR

## 2019-07-13 NOTE — Progress Notes (Signed)
Denise Fuller , 11-23-1935, 84 y.o., female MRN: [id number] Patient Care Team    Relationship Specialty Notifications Start End  Ma Hillock, DO PCP - General Family Medicine  01/10/16   Otelia Sergeant, OD Referring Physician   01/10/16    Comment: ophth- macular degeneration  [facility]  04/26/17     Chief Complaint  Patient presents with  . Follow-up    Discuss lab results. Pt went to cardiologist and he suggested getting B12 injections. [facility] did not have sublingual B12 and pt says the B12 is expensive OTC. She would like the injections if possible      Subjective: Denise Fuller is a 84 y.o. female present for continued fatigue.  Patient presents today to follow-up on her lab results and discuss her fatigue.  Since her last appointment she has been seen by the cardiologist who plans to perform an echo.  Today we discussed her laboratory abnormalities with a low TSH, mildly low albumin with positive light chains on SPEP, B12 deficiency, elevated alk phos, x-ray results of old T12 compression fracture and degenerative disc disease, normal: Vitamin D, iron, CBC, PTH calcium, GGT and urine culture. Prior note: Patient describes her fatigue as "severe.  "She had a history of low iron, was unable to tolerate iron supplements but has been increasing iron in her diet.  Her iron has been within normal limits since.  She has a history of hypothyroidism.  Doses have been altered of her levothyroxine, with normal thyroid function last 02/11/2019.  Liver and kidney function, along with electrolytes have been normal last fall 2020 with an elevated alk phos.  Her calcium levels have been normal.  Blood cell counts have been normal.  A1c normal.  Patient reports he supplements with 1000 units of vitamin D daily. She does endorse having some stress.  She does not sleep as well as she would like.  She was unable to tolerate trazodone that she reports she did not like the way it made her feel and  it did not work well for her.  She does admit she thinks she does not sleep as well because she is worried about needing to get up with her husband to help care for him. She also endorses low back discomfort, generalized fatigue, lightheadedness and occasional palpitations.  Her son has been treated for atrial fibrillation.  Her daughter was thought to possibly have A. Fib that caused her heart problems.  Her father has a history of aneurysms of the brain. Patient has a history of Mnire's disease and on HCTZ.  Blood pressure has been well controlled on HCTZ/lisinopril.  She takes melatonin and Zyrtec nightly.  Depression screen [facility] 2/9 10/15/2018 04/10/2018 04/26/2017 04/23/2016 01/10/2016  Decreased Interest 0 0 0 0 0  Down, Depressed, Hopeless 0 0 0 0 0  PHQ - 2 Score 0 0 0 0 0  Altered sleeping - 0 - - -  Tired, decreased energy - 0 - - -  Change in appetite - 0 - - -  Feeling bad or failure about yourself  - 0 - - -  Trouble concentrating - 0 - - -  Moving slowly or fidgety/restless - 0 - - -  Suicidal thoughts - 0 - - -  PHQ-9 Score - 0 - - -  Difficult doing work/chores - Not difficult at all - - -    Allergies  Allergen Reactions  . Doxycycline     GI  upset  . Keflex [Cephalexin] Hives and Itching  . Iron Rash   Social History   Social History Narrative   Retired. Married to Bowie. They have 3 children [name])   Tyro from [place] 05/2014.    Has 2 older dogs.    Retired.    Drinks caffeine, takes herbal remedies and dialy vitamin.   Wears her seatbelt, smoke detector in the home   Wears dentures, no assistive devices for walking - independent.    Feels safe in her relationships.    Past Medical History:  Diagnosis Date  . Bartonella infection   . BCC (basal cell carcinoma of skin) 1997   nose  . Chickenpox   . Chronic venous insufficiency   . Diverticulosis   . Elevated alkaline phosphatase level   . Elevated LDH   . Environmental allergies   . GAD  (generalized anxiety disorder)   . Groin abscess    culture negative, doxy resolved abscess  . Hepatitis A   . Hyperlipidemia   . Hypertension   . Hypothyroidism   . Lymphadenopathy, cervical    chronic per pt. (left)  . Macular degeneration   . Meniere disease   . Thyroid disease    Past Surgical History:  Procedure Laterality Date  . ABDOMINAL HYSTERECTOMY  1991  . APPENDECTOMY  1967  . BASAL CELL CARCINOMA EXCISION  1997   nose  . BLEPHAROPLASTY    . CATARACT EXTRACTION W/ INTRAOCULAR LENS IMPLANT Bilateral Oct and Nov 2018  . CHOLECYSTECTOMY  1967   Family History  Problem Relation Age of Onset  . Arthritis Mother   . Heart disease Mother   . Arthritis Father   . Aneurysm Father 49       brain  . Thyroid disease Daughter   . Stroke Daughter        2020  . Atrial fibrillation Son        2020    All past medical history, surgical history, allergies, family history, immunizations andmedications were updated in the EMR today and reviewed under the history and medication portions of their EMR.     ROS: Negative, with the exception of above mentioned in HPI   Objective:  BP 108/74 (BP Location: Left Arm, Patient Position: Sitting, Cuff Size: Normal)   Pulse 84   Temp 97.6 F (36.4 C) (Temporal)   Resp 18   Ht _0  (1.651 m)   Wt 164 lb (74.4 kg)   SpO2 96%   BMI 27.29 kg/m  Body mass index is 27.29 kg/m. Gen: Afebrile. No acute distress.  Nontoxic in presentation, well-developed, well-nourished. HENT: AT. [place].  Eyes:Pupils Equal Round Reactive to light, Extraocular movements intact,  Conjunctiva without redness, discharge or icterus. Neck/lymp/endocrine: Supple, no lymphadenopathy CV: RRR, no edema, +2/4 P posterior tibialis pulses Chest: CTAB, no wheeze or crackles Skin: No rashes, purpura or petechiae.  Neuro:  Normal gait. PERLA. EOMi. Alert. Oriented x3  Psych: Normal affect, dress and demeanor. Normal speech. Normal thought content and judgment.    No exam data present No results found. No results found for this or any previous visit (from the past 24 hour(s)).  Assessment/Plan: Denise Fuller is a 84 y.o. female present for OV for  Hypothyroidism, unspecified type Labs mildly oversupplemented.  Last appointment decreased weekly total of levothyroxine.  Dosages remain the same levothyroxine 112 mcg daily, except for 1 day a week take half a tab. Repeat labs due into June 1  week in July lab appointment only  Palpitations/lightheadedness/weakness/fatigue/dyspnea on exertion Cardiology> performing echo.   Weakness generalized/lightheadedness/elevated alk phos/fatigue/B12 deficiency - B12 - low> she was unable to get the sublingual B12 therefore started B12 injections for her today with instructions to have B12 injections by nurse visit every 2 weeks x 4, then 1 times a month.  She will need to follow-up 1 week after her last injection with provider and repeat laboratory results - TSH- low > adjusted dose - Thyroid peroxidase antibody> 9 - Protein,Total and Electrophor w/IFE-([facility])> light chains, low alb> UPEP ordered.  Pending results we will consider referral to hematology.  Chronic bilateral low back pain without sciatica Discussed trial of anti-inflammatory daily medication and she states she is doing okay by taking Advil 1 time per day.  Anxiety: Tolerating Lexapro.  Proper use was discussed with her today.  She is aware this is not a sleeping agent and will need to be in her system at least 4 weeks before she will see effects of medication in its entirety.  Patient reports understanding.   Reviewed expectations re: course of current medical issues.  Discussed self-management of symptoms.  Outlined signs and symptoms indicating need for more acute intervention.  Patient verbalized understanding and all questions were answered.  Patient received an After-Visit Summary.   Orders Placed This Encounter  Procedures  .  Protein, Total and Electro, 24 Hr U   Meds ordered this encounter  Medications  . cyanocobalamin ((VITAMIN B-12)) injection 1,000 mcg   Referral Orders  No referral(s) requested today     Note is dictated utilizing voice recognition software. Although note has been proof read prior to signing, occasional typographical errors still can be missed. If any questions arise, please do not hesitate to call for verification.   electronically signed by:  Howard Pouch, DO  [place]

## 2019-07-13 NOTE — Patient Instructions (Addendum)
We will start b12 injections today.  Set up every 2 weeks for 3 more doses, then once monthly. We will need to test your b12 levels 1 week after your last injection.   Start lexapro daily> this does not work like a sleeping pill. It is a mild anti-anxiety. It has to be taken daily and you need to be on the medication for a few weeks before you will be able to notice a difference.   Please complete the 24 hour urine collection and return.   Thyroid needs tested first week in July by lab appt only.    Bence-Jones Protein Test Why am I having this test? The Bence-Jones protein test is used to help diagnose and monitor the treatment of multiple myeloma and other similar diseases. It checks for the presence of Bence-Jones proteins (antibodies), also called M proteins, which are not present in healthy people. If present, these proteins can be a sign of cancer of the plasma cells or other diseases that cause excessive plasma cells in the body. What is being tested? This test checks the urine for the presence of antibody proteins that are made by plasma cells. Plasma cells are a form of white blood cells in the body. Bence-Jones light chain antibodies measured in the urine include:  Kappa total light chain.  Lambda total light chain. What kind of sample is taken?  A urine sample is required for this test. How do I collect samples at home? You may be asked to collect either a one-time sample of urine or all of the urine you pass over a 24-hour period. When collecting a urine sample at home, make sure you:  Use supplies and instructions that you received from the lab.  Collect urine only in the germ-free (sterile) cup that you received from the lab.  Do not let any toilet paper or stool (feces) get into the cup.  Refrigerate the sample until you can return it to the lab.  Return the sample(s) to the lab as instructed. Tell a health care provider about:  All medicines you are taking,  including vitamins, herbs, eye drops, creams, and over-the-counter medicines.  Any blood disorders you have.  Any surgeries you have had.  Any medical conditions you have.  Whether you are pregnant or may be pregnant. How are the results reported? Your test results will be reported as ranges, which are given as milligrams of protein per deciliter (mg/dL). Your results will also be reported as a ratio of how the proteins compare to each other. Your health care provider will compare your results to normal ranges that were established after testing a large group of people (reference ranges). Reference ranges may vary among labs and hospitals. For this test, common reference ranges are:  Kappa total light chain: less than 0.68 mg/dL.  Lambda total light chain: less than 0.40 mg/dL.  Kappa to lambda ratio: 0.7-6.2. What do the results mean? Results within reference ranges are normal, meaning that you do not have multiple myeloma or other similar diseases. Results that are greater than the reference ranges may mean that you have:  Multiple myeloma.  Chronic lymphocytic leukemia.  Lymphoma.  Other blood disorders such as amyloidosis or [place] macroglobulinemia.  Different types of metastatic cancer. Talk with your health care provider about what your results mean. Questions to ask your health care provider Ask your health care provider, or the department that is doing the test:  When will my results be ready?  How will  I get my results?  What are my treatment options?  What other tests do I need?  What are my next steps? Summary  The Bence-Jones protein test is used to help diagnose and monitor the treatment of multiple myeloma and other similar diseases.  This test involves collecting either a one-time sample of urine or all of the urine you pass over a 24-hour period. This test checks the amounts of certain Bence-Jones proteins in your urine.  Results that are  greater than the reference ranges may mean that you have multiple myeloma or other similar diseases. This information is not intended to replace advice given to you by your health care provider. Make sure you discuss any questions you have with your health care provider. Document Revised: 08/21/2016 Document Reviewed: 08/21/2016 Elsevier Patient Education  [place].

## 2019-07-15 ENCOUNTER — Other Ambulatory Visit: Payer: Self-pay

## 2019-07-15 DIAGNOSIS — R778 Other specified abnormalities of plasma proteins: Secondary | ICD-10-CM

## 2019-07-15 DIAGNOSIS — R531 Weakness: Secondary | ICD-10-CM

## 2019-07-16 DIAGNOSIS — R778 Other specified abnormalities of plasma proteins: Secondary | ICD-10-CM

## 2019-07-16 DIAGNOSIS — E538 Deficiency of other specified B group vitamins: Secondary | ICD-10-CM

## 2019-07-16 HISTORY — DX: Deficiency of other specified B group vitamins: E53.8

## 2019-07-16 HISTORY — DX: Other specified abnormalities of plasma proteins: R77.8

## 2019-07-17 DIAGNOSIS — H26493 Other secondary cataract, bilateral: Secondary | ICD-10-CM | POA: Diagnosis not present

## 2019-07-17 DIAGNOSIS — H26492 Other secondary cataract, left eye: Secondary | ICD-10-CM | POA: Diagnosis not present

## 2019-07-17 DIAGNOSIS — H18513 Endothelial corneal dystrophy, bilateral: Secondary | ICD-10-CM | POA: Diagnosis not present

## 2019-07-17 DIAGNOSIS — H353131 Nonexudative age-related macular degeneration, bilateral, early dry stage: Secondary | ICD-10-CM | POA: Diagnosis not present

## 2019-07-17 DIAGNOSIS — Z961 Presence of intraocular lens: Secondary | ICD-10-CM | POA: Diagnosis not present

## 2019-07-17 LAB — PROTEIN, TOTAL AND ELECTRO, 24 HR U
Creatinine, 24H Ur: 0.74 g/(24.h) (ref 0.50–2.15)
PROTEIN/CREATININE RATIO: 0.163 mg/mg creat — ABNORMAL HIGH (ref <=0.1)
PROTEIN/CREATININE RATIO: 163 mg/g creat — ABNORMAL HIGH (ref <=114)
Protein, 24H Urine: 120 mg/24 h (ref 0–149)

## 2019-07-21 ENCOUNTER — Telehealth: Payer: Self-pay | Admitting: Family Medicine

## 2019-07-21 DIAGNOSIS — R778 Other specified abnormalities of plasma proteins: Secondary | ICD-10-CM

## 2019-07-21 DIAGNOSIS — R768 Other specified abnormal immunological findings in serum: Secondary | ICD-10-CM | POA: Insufficient documentation

## 2019-07-21 DIAGNOSIS — R748 Abnormal levels of other serum enzymes: Secondary | ICD-10-CM

## 2019-07-21 DIAGNOSIS — R5383 Other fatigue: Secondary | ICD-10-CM

## 2019-07-21 HISTORY — DX: Other specified abnormal immunological findings in serum: R76.8

## 2019-07-21 NOTE — Telephone Encounter (Signed)
Please inform patient Denise Fuller urine results resulted with a mildly elevated protein ratio, but normal protein levels.  - It also had a "faint" albumin band.  There were no bence jones proteins in the urine. There were no "m-spikes" on the blood test. Which are good signs.  To summarize:  The blood test (spep) we did first resulted with light chains present and decrease albumin. Denise Fuller urine (UPEP) had increased albumin. There are certain blood conditions that can cause fatigue, kidney dysfx that cause protein/albumin loses through urine and can also have affects on heart and other organs. These kind of conditions can present with "light chains"  On the SPEP test as she had.   Therefore, I would suggest she be referred to a blood specialist to weigh in on their opinion to be sure.   Referral placed.

## 2019-07-21 NOTE — Telephone Encounter (Signed)
Tried to contact pt with no answer, VM was full and was unable to leave message

## 2019-07-24 NOTE — Telephone Encounter (Signed)
Pt was called and given information/results. She has already scheduled this visit.

## 2019-07-27 ENCOUNTER — Ambulatory Visit (INDEPENDENT_AMBULATORY_CARE_PROVIDER_SITE_OTHER): Payer: Medicare HMO | Admitting: Family Medicine

## 2019-07-27 ENCOUNTER — Telehealth: Payer: Self-pay | Admitting: Family Medicine

## 2019-07-27 ENCOUNTER — Other Ambulatory Visit: Payer: Self-pay

## 2019-07-27 DIAGNOSIS — E538 Deficiency of other specified B group vitamins: Secondary | ICD-10-CM

## 2019-07-27 MED ORDER — CYANOCOBALAMIN 1000 MCG/ML IJ SOLN
1000.0000 ug | Freq: Once | INTRAMUSCULAR | Status: AC
  Administered 2019-07-27: 1000 ug via INTRAMUSCULAR

## 2019-07-27 NOTE — Progress Notes (Addendum)
Denise Fuller is a 84 y.o. female presents to the office today for Vitamin B12 injections, per physician's orders. Original order: 07/13/19- B12 injections by nurse visit every 2 weeks x 4, then 1 times a month.  She will need to follow-up 1 week after her last injection with provider and repeat laboratory results # 2 of 4 bi-weekly B12 injections   Vitamin B12 1043mcg IM was administered right deltoid today. Patient tolerated injection. Patient due for follow up labs/provider appt: Yes. Date due: 1 week after last B12, appt made No Patient next injection due: 08/07/19, appt made No  [name], CMA  Dr Anitra Lauth, please sign off in Dr Lucita Lora presence.  Thanks.  Pt with vit B12 deficiency.  Agree with vit B12 1000 mcg IM in office today. Signed:  Crissie Sickles, MD           08/05/2019

## 2019-07-27 NOTE — Telephone Encounter (Signed)
Patient came into office today for B12 injection and wanted to see if she could stop the lexapro 5 mg?  Patient states it makes her feel tired and gives her a headache.  Please advise.

## 2019-07-28 NOTE — Telephone Encounter (Signed)
Patient aware that she may stop lexapro.

## 2019-07-28 NOTE — Telephone Encounter (Signed)
Tried to call patient, but number rang and then disconnected.  Will need to try back at another time.

## 2019-07-28 NOTE — Telephone Encounter (Signed)
It is safe for her to stop Lexapro if she desires.

## 2019-07-31 ENCOUNTER — Other Ambulatory Visit: Payer: Self-pay

## 2019-07-31 ENCOUNTER — Ambulatory Visit (HOSPITAL_BASED_OUTPATIENT_CLINIC_OR_DEPARTMENT_OTHER)
Admission: RE | Admit: 2019-07-31 | Discharge: 2019-07-31 | Disposition: A | Discharge status: Home / Self Care | Payer: Medicare HMO | Source: Ambulatory Visit | Attending: Cardiology | Admitting: Cardiology

## 2019-07-31 DIAGNOSIS — R0600 Dyspnea, unspecified: Secondary | ICD-10-CM | POA: Diagnosis not present

## 2019-08-04 DIAGNOSIS — H26491 Other secondary cataract, right eye: Secondary | ICD-10-CM | POA: Diagnosis not present

## 2019-08-17 ENCOUNTER — Ambulatory Visit (INDEPENDENT_AMBULATORY_CARE_PROVIDER_SITE_OTHER): Payer: Medicare HMO | Admitting: Family Medicine

## 2019-08-17 ENCOUNTER — Other Ambulatory Visit: Payer: Self-pay

## 2019-08-17 DIAGNOSIS — E538 Deficiency of other specified B group vitamins: Secondary | ICD-10-CM

## 2019-08-17 MED ORDER — CYANOCOBALAMIN 1000 MCG/ML IJ SOLN
1000.0000 ug | Freq: Once | INTRAMUSCULAR | Status: AC
  Administered 2019-08-17: 1000 ug via INTRAMUSCULAR

## 2019-08-17 NOTE — Progress Notes (Signed)
Denise Fuller is a 84 y.o. female presents to the office today for Vitamin B12 injections, per physician's orders. Original order: 07/13/19- B12 injections by nurse visit every 2 weeks x 4, then 1 times a month. She will need to follow-up 1 week after her last injection with provider and repeat laboratory results # 3 of 4 bi-weekly B12 injections   Vitamin B12 1067mcg IM was administered right deltoid today. Patient tolerated injection. Patient due for follow up labs/provider appt: Yes. Date due: 1 week after last B12, appt made No Patient next injection due: 08/07/19, appt made No  [name], CMA  Dr Anitra Lauth, please sign off in Dr Lucita Lora presence.  Thanks.

## 2019-08-24 NOTE — Progress Notes (Signed)
Pt with vit B12 deficiency.  Agree with vit B12 1000 mcg IM in office today. Signed:  Phil Brandey [place], MD           08/24/2019  

## 2019-08-28 ENCOUNTER — Inpatient Hospital Stay: Payer: Medicare HMO | Attending: Hematology & Oncology

## 2019-08-28 ENCOUNTER — Inpatient Hospital Stay (HOSPITAL_BASED_OUTPATIENT_CLINIC_OR_DEPARTMENT_OTHER): Payer: Medicare HMO | Admitting: Hematology & Oncology

## 2019-08-28 ENCOUNTER — Other Ambulatory Visit: Payer: Self-pay

## 2019-08-28 ENCOUNTER — Encounter: Payer: Self-pay | Admitting: Hematology & Oncology

## 2019-08-28 VITALS — BP 117/69 | HR 85 | Temp 98.7°F | Resp 20 | Ht 63.75 in | Wt 161.1 lb

## 2019-08-28 DIAGNOSIS — R5383 Other fatigue: Secondary | ICD-10-CM | POA: Insufficient documentation

## 2019-08-28 DIAGNOSIS — R778 Other specified abnormalities of plasma proteins: Secondary | ICD-10-CM

## 2019-08-28 DIAGNOSIS — G629 Polyneuropathy, unspecified: Secondary | ICD-10-CM | POA: Diagnosis not present

## 2019-08-28 DIAGNOSIS — E039 Hypothyroidism, unspecified: Secondary | ICD-10-CM

## 2019-08-28 DIAGNOSIS — D472 Monoclonal gammopathy: Secondary | ICD-10-CM | POA: Diagnosis not present

## 2019-08-28 DIAGNOSIS — Z7189 Other specified counseling: Secondary | ICD-10-CM | POA: Diagnosis not present

## 2019-08-28 HISTORY — DX: Monoclonal gammopathy: D47.2

## 2019-08-28 HISTORY — DX: Other specified counseling: Z71.89

## 2019-08-28 LAB — CBC WITH DIFFERENTIAL (CANCER CENTER ONLY)
Abs Immature Granulocytes: 0.02 10*3/uL (ref 0.00–0.07)
Basophils Absolute: 0 10*3/uL (ref 0.0–0.1)
Basophils Relative: 1 %
Eosinophils Absolute: 0.2 10*3/uL (ref 0.0–0.5)
Eosinophils Relative: 2 %
HCT: 39.3 % (ref 36.0–46.0)
Hemoglobin: 12.8 g/dL (ref 12.0–15.0)
Immature Granulocytes: 0 %
Lymphocytes Relative: 31 %
Lymphs Abs: 2.5 10*3/uL (ref 0.7–4.0)
MCH: 29.4 pg (ref 26.0–34.0)
MCHC: 32.6 g/dL (ref 30.0–36.0)
MCV: 90.3 fL (ref 80.0–100.0)
Monocytes Absolute: 0.8 10*3/uL (ref 0.1–1.0)
Monocytes Relative: 10 %
Neutro Abs: 4.5 10*3/uL (ref 1.7–7.7)
Neutrophils Relative %: 56 %
Platelet Count: 292 10*3/uL (ref 150–400)
RBC: 4.35 MIL/uL (ref 3.87–5.11)
RDW: 14.2 % (ref 11.5–15.5)
WBC Count: 8 10*3/uL (ref 4.0–10.5)
nRBC: 0 % (ref 0.0–0.2)

## 2019-08-28 LAB — CMP (CANCER CENTER ONLY)
ALT: 16 U/L (ref 0–44)
AST: 18 U/L (ref 15–41)
Albumin: 4.3 g/dL (ref 3.5–5.0)
Alkaline Phosphatase: 165 U/L — ABNORMAL HIGH (ref 38–126)
Anion gap: 8 (ref 5–15)
BUN: 18 mg/dL (ref 8–23)
CO2: 29 mmol/L (ref 22–32)
Calcium: 9.8 mg/dL (ref 8.9–10.3)
Chloride: 104 mmol/L (ref 98–111)
Creatinine: 1.07 mg/dL — ABNORMAL HIGH (ref 0.44–1.00)
GFR, Est AFR Am: 56 mL/min — ABNORMAL LOW (ref >60)
GFR, Estimated: 49 mL/min — ABNORMAL LOW (ref >60)
Glucose, Bld: 113 mg/dL — ABNORMAL HIGH (ref 70–99)
Potassium: 3.5 mmol/L (ref 3.5–5.1)
Sodium: 141 mmol/L (ref 135–145)
Total Bilirubin: 0.3 mg/dL (ref 0.3–1.2)
Total Protein: 7 g/dL (ref 6.5–8.1)

## 2019-08-28 NOTE — Progress Notes (Signed)
Referral MD  Reason for Referral: IgG Kappa MGUS  Chief Complaint  Patient presents with  . New Patient (Initial Visit)    "Protein in my blood"  : I was told that I had an abnormal protein in my blood.  HPI: Denise Fuller is a very charming and very youthful appearing 84 year old white female.  She is originally from [place].  She has a mostly down in [place].  She tells me a lot of good stories about her family.  She has been fairly healthy.  She really has no had to me the way of health issues.  She does have some neuropathy.  She has had some fatigue.  She has hypothyroidism.  She is followed by Dr. Raoul Pitch, who is incredibly thorough and incredibly compassionate with her patients.  Dr. Raoul Pitch did a serum protein electrophoresis on her.  This was done on 06/22/2019.  There was a faint IgG kappa monoclonal gammopathy on the immunoelectrophoresis.  There was nothing on the actual SPEP.  She had a 24-hour urine done for monoclonal light chains.  There was no monoclonal light chains (Bence-Jones protein) in the urine.  Based on this, Dr. Raoul Pitch wanted Denise Fuller to be seen by hematology and kindly referred her to the [facility] for an evaluation.  She is doing okay right now.  She has had no problems with bowels or bladder.  She has had no cough or shortness of breath.  She is very diligent with the coronavirus.  There is been no rashes.  Again she does have this neuropathy in her legs.  She is able to walk.  She has not lost weight.  She has had no rashes.  She has had no headache.  There is no visual changes.  Her performance status for now is ECOG 1.    Past Medical History:  Diagnosis Date  . Bartonella infection   . BCC (basal cell carcinoma of skin) 1997   nose  . Chickenpox   . Chronic venous insufficiency   . Diverticulosis   . Elevated alkaline phosphatase level   . Elevated LDH   . Environmental allergies   . GAD (generalized anxiety  disorder)   . Groin abscess    culture negative, doxy resolved abscess  . Hepatitis A   . Hyperlipidemia   . Hypertension   . Hypothyroidism   . Lymphadenopathy, cervical    chronic per pt. (left)  . Macular degeneration   . Meniere disease   . Thyroid disease   :  Past Surgical History:  Procedure Laterality Date  . ABDOMINAL HYSTERECTOMY  1991  . APPENDECTOMY  1967  . BASAL CELL CARCINOMA EXCISION  1997   nose  . BLEPHAROPLASTY    . CATARACT EXTRACTION W/ INTRAOCULAR LENS IMPLANT Bilateral Oct and Nov 2018  . CHOLECYSTECTOMY  1967  :   Current Outpatient Medications:  .  aspirin EC 81 MG tablet, Take 81 mg by mouth daily., Disp: , Rfl:  .  Cholecalciferol (VITAMIN D3) 1000 units CAPS, Take by mouth. 2,000 units daily, Disp: , Rfl:  .  Cyanocobalamin (B-12 COMPLIANCE INJECTION) 1000 MCG/ML KIT, Inject as directed. 08/28/2019 Weekly, Disp: , Rfl:  .  DHA-EPA-Vitamin E (OMEGA-3 COMPLEX PO), Take 360 mg by mouth daily., Disp: , Rfl:  .  ELDERBERRY PO, Take by mouth., Disp: , Rfl:  .  Flaxseed, Linseed, (FLAX SEED OIL PO), Take 1,300 mg by mouth 2 (two) times daily., Disp: , Rfl:  .  levothyroxine (SYNTHROID) 112 MCG tablet, 1 tablet daily on an empty stomach, Disp: 90 tablet, Rfl: 3 .  lisinopril (ZESTRIL) 5 MG tablet, TAKE 1 TABLET EVERY DAY, Disp: 90 tablet, Rfl: 1 .  Polyethyl Glycol-Propyl Glycol (SYSTANE OP), Apply to eye., Disp: , Rfl:  .  triamterene-hydrochlorothiazide (DYAZIDE) 37.5-25 MG capsule, TAKE 1 CAPSULE EVERY DAY, Disp: 90 capsule, Rfl: 1 .  Biotin 5000 MCG TABS, Take by mouth. (Patient not taking: Reported on 08/28/2019), Disp: , Rfl:  .  calcium citrate-vitamin D (CITRACAL+D) 315-200 MG-UNIT tablet, Take 1 tablet by mouth 2 (two) times daily. (Patient not taking: Reported on 08/28/2019), Disp: , Rfl:  .  cetirizine (ZYRTEC) 10 MG tablet, Take 10 mg by mouth as needed.  (Patient not taking: Reported on 08/28/2019), Disp: , Rfl:  .  co-enzyme Q-10 30 MG capsule,  Take 30 mg by mouth 3 (three) times daily. (Patient not taking: Reported on 08/28/2019), Disp: , Rfl:  .  escitalopram (LEXAPRO) 5 MG tablet, Take 1 tablet (5 mg total) by mouth daily. (Patient not taking: Reported on 08/28/2019), Disp: 90 tablet, Rfl: 1 .  glucosamine-chondroitin 500-400 MG tablet, Take 1 tablet by mouth 3 (three) times daily. (Patient not taking: Reported on 08/28/2019), Disp: , Rfl: :  :  Allergies  Allergen Reactions  . Doxycycline     GI upset  . Keflex [Cephalexin] Hives and Itching  . Iron Rash  :  Family History  Problem Relation Age of Onset  . Arthritis Mother   . Heart disease Mother   . Arthritis Father   . Aneurysm Father 73       brain  . Thyroid disease Daughter   . Stroke Daughter        2020  . Atrial fibrillation Son        2020  :  Social History   Socioeconomic History  . Marital status: Married    Spouse name: [name]  . Number of children: 3  . Years of education: Not on file  . Highest education level: Not on file  Occupational History  . Occupation: retired  Tobacco Use  . Smoking status: Never Smoker  . Smokeless tobacco: Never Used  Vaping Use  . Vaping Use: Never used  Substance and Sexual Activity  . Alcohol use: No  . Drug use: No  . Sexual activity: Yes    Partners: Male    Comment: married  Other Topics Concern  . Not on file  Social History Narrative   Retired. Married to [place]. They have 3 children [name])   [place] from [place] 05/2014.    Has 2 older dogs.    Retired.    Drinks caffeine, takes herbal remedies and dialy vitamin.   Wears her seatbelt, smoke detector in the home   Wears dentures, no assistive devices for walking - independent.    Feels safe in her relationships.    Social Determinants of Health   Financial Resource Strain:   . Difficulty of Paying Living Expenses:   Food Insecurity:   . Worried About Charity fundraiser in the Last Year:   . Arboriculturist in the Last Year:    Transportation Needs:   . Film/video editor (Medical):   Marland Kitchen Lack of Transportation (Non-Medical):   Physical Activity:   . Days of Exercise per Week:   . Minutes of Exercise per Session:   Stress:   . Feeling of Stress :   Social  Connections:   . Frequency of Communication with Friends and Family:   . Frequency of Social Gatherings with Friends and Family:   . Attends Religious Services:   . Active Member of Clubs or Organizations:   . Attends Archivist Meetings:   Marland Kitchen Marital Status:   Intimate Partner Violence:   . Fear of Current or Ex-Partner:   . Emotionally Abused:   Marland Kitchen Physically Abused:   . Sexually Abused:   :  Review of Systems  Constitutional: Positive for malaise/fatigue.  HENT: Negative.   Eyes: Negative.   Respiratory: Negative.   Cardiovascular: Negative.   Gastrointestinal: Negative.   Genitourinary: Negative.   Musculoskeletal: Negative.   Skin: Negative.   Neurological: Negative.   Endo/Heme/Allergies: Negative.   Psychiatric/Behavioral: Negative.      Exam:  Ms. Devos is a well-developed well-nourished white female in no obvious distress.  Vital signs show temperature of 98.7.  Pulse 85.  Blood pressure 117/69.  Weight is 161 pounds.  Head and neck exam shows a no ocular or oral lesions.  There are no abnormal cervical or supraclavicular lymph nodes.  Lungs are clear bilaterally.  Cardiac exam regular rate and rhythm with no murmurs, rubs or bruits.  Abdomen is soft.  She has good bowel sounds.  There is no fluid wave.  There is no palpable liver or spleen tip.  Back exam shows no tenderness over the spine, ribs or hips.  Extremities shows no clubbing, cyanosis or edema.  She has some varicose veins in the lower legs.  Neurological exam shows no focal neurological deficit.  Skin exam shows no rashes, ecchymoses or petechia.  '@IPVITALS'$ @   Recent Labs    08/28/19 1356  WBC 8.0  HGB 12.8  HCT 39.3  PLT 292   Recent Labs     08/28/19 1356  NA 141  K 3.5  CL 104  CO2 29  GLUCOSE 113*  BUN 18  CREATININE 1.07*  CALCIUM 9.8    Blood smear review: Normochromic normocytic population of red blood cells.  There is no rouleaux formation.  I see no immature myeloid or lymphoid cells.  She has no plasma cells.  There is no hypersegmented polys.  She has normal platelets with good granulation.  Pathology: None    Assessment and Plan: Ms. Sirmon is a very charming 84 year old white female.  She has a minimal, not even measurable IgG kappa M spike.  I cannot imagine that this is going to amount to any lymphoplasmacytic disorder.  I suspect that the MGUS is more reflective of her age and maybe other health problems.  I do not think that the MGUS has anything to do with the neuropathy.  She does not need any type of invasive studies.  She does not need a bone survey.  I do not even feel that another 24-hour urine would help [place] out.  Again, I just do not believe that this IgG kappa MGUS is currently clinically significant.  Again is not even measurable on the SPEP.  At this point, even though Ms. Schirm is nice, I just do not think we have to see her back in the office.  I know that she is going to get excellent follow-up from Dr. Raoul Pitch.  I would recommend that Dr. Raoul Pitch just do an SPEP on her every 6 months.  If Dr. Raoul Pitch does see a rise in her M spike to a measurable level, then we can certainly get Ms. Romm back.  I spent about 45 minutes with Ms. Burright.  It was enjoyable talking to her.  She is very delightful.

## 2019-08-29 LAB — IGG, IGA, IGM
IgA: 155 mg/dL (ref 64–422)
IgG (Immunoglobin G), Serum: 959 mg/dL (ref 586–1602)
IgM (Immunoglobulin M), Srm: 61 mg/dL (ref 26–217)

## 2019-08-31 ENCOUNTER — Telehealth: Payer: Self-pay | Admitting: Hematology & Oncology

## 2019-08-31 LAB — KAPPA/LAMBDA LIGHT CHAINS
Kappa free light chain: 24.6 mg/L — ABNORMAL HIGH (ref 3.3–19.4)
Kappa, lambda light chain ratio: 1.32 (ref 0.26–1.65)
Lambda free light chains: 18.6 mg/L (ref 5.7–26.3)

## 2019-08-31 NOTE — Telephone Encounter (Signed)
No los 7/23

## 2019-09-01 ENCOUNTER — Ambulatory Visit (INDEPENDENT_AMBULATORY_CARE_PROVIDER_SITE_OTHER): Payer: Medicare HMO | Admitting: Family Medicine

## 2019-09-01 ENCOUNTER — Other Ambulatory Visit: Payer: Self-pay

## 2019-09-01 DIAGNOSIS — E538 Deficiency of other specified B group vitamins: Secondary | ICD-10-CM | POA: Diagnosis not present

## 2019-09-01 LAB — PROTEIN ELECTROPHORESIS, SERUM, WITH REFLEX
A/G Ratio: 1.1 (ref 0.7–1.7)
Albumin ELP: 3.5 g/dL (ref 2.9–4.4)
Alpha-1-Globulin: 0.2 g/dL (ref 0.0–0.4)
Alpha-2-Globulin: 0.8 g/dL (ref 0.4–1.0)
Beta Globulin: 1.1 g/dL (ref 0.7–1.3)
Gamma Globulin: 1 g/dL (ref 0.4–1.8)
Globulin, Total: 3.2 g/dL (ref 2.2–3.9)
SPEP Interpretation: 0
Total Protein ELP: 6.7 g/dL (ref 6.0–8.5)

## 2019-09-01 LAB — IMMUNOFIXATION REFLEX, SERUM
IgA: 170 mg/dL (ref 64–422)
IgG (Immunoglobin G), Serum: 1033 mg/dL (ref 586–1602)
IgM (Immunoglobulin M), Srm: 66 mg/dL (ref 26–217)

## 2019-09-01 MED ORDER — CYANOCOBALAMIN 1000 MCG/ML IJ SOLN
1000.0000 ug | Freq: Once | INTRAMUSCULAR | Status: AC
  Administered 2019-09-01: 1000 ug via INTRAMUSCULAR

## 2019-09-01 NOTE — Progress Notes (Signed)
Denise Milian Smithis a 84 y.o.femalepresents to the office today for Vitamin B12injections, per physician's orders. Original order:07/13/19-B12 injections by nurse visit every 2 weeks x 4, then 1 times a month. She will need to follow-up 1 week after her last injection with provider and repeat laboratory results  # 4 of 4 bi-weekly B12 injections   Vitamin B12 1022mcg IMwas administered right deltoidtoday. Patient tolerated injection. Patient due for follow up labs/provider appt:Yes. Date due:1 week after last B12, appt madeNo Patient next injection due:10/01/19, appt Barney Drain, CMA

## 2019-09-08 ENCOUNTER — Ambulatory Visit: Payer: Medicare HMO | Admitting: Cardiology

## 2019-09-14 DIAGNOSIS — H353112 Nonexudative age-related macular degeneration, right eye, intermediate dry stage: Secondary | ICD-10-CM | POA: Diagnosis not present

## 2019-09-14 DIAGNOSIS — H353123 Nonexudative age-related macular degeneration, left eye, advanced atrophic without subfoveal involvement: Secondary | ICD-10-CM | POA: Diagnosis not present

## 2019-09-14 DIAGNOSIS — H35423 Microcystoid degeneration of retina, bilateral: Secondary | ICD-10-CM | POA: Diagnosis not present

## 2019-09-14 DIAGNOSIS — H43813 Vitreous degeneration, bilateral: Secondary | ICD-10-CM | POA: Diagnosis not present

## 2019-09-24 ENCOUNTER — Telehealth: Payer: Self-pay

## 2019-09-24 DIAGNOSIS — N390 Urinary tract infection, site not specified: Secondary | ICD-10-CM | POA: Diagnosis not present

## 2019-09-24 NOTE — Telephone Encounter (Signed)
Patient thinks she has another urinary tract infection and is requesting Dr. Raoul Pitch to call in antibiotic for her.  Asked if she can come now do a urine specimen. I told her I could not give approval on either until Dr. Raoul Pitch gives approval.  Please call patient at [phone]

## 2019-09-24 NOTE — Telephone Encounter (Signed)
Attempted to contact pt with no answer and mailbox full. Pt needs to schedule appointment for both OV and Lab visit    Patient thinks she has another urinary tract infection and is requesting Dr. Raoul Pitch to call in antibiotic for her.  Asked if she can come now do a urine specimen. I told her I could not give approval on either until Dr. Raoul Pitch gives approval.  Please call patient at [phone]

## 2019-09-29 ENCOUNTER — Ambulatory Visit: Payer: Medicare HMO

## 2019-09-30 ENCOUNTER — Other Ambulatory Visit: Payer: Self-pay

## 2019-09-30 ENCOUNTER — Ambulatory Visit: Payer: Medicare HMO | Admitting: Cardiology

## 2019-09-30 ENCOUNTER — Ambulatory Visit (INDEPENDENT_AMBULATORY_CARE_PROVIDER_SITE_OTHER): Payer: Medicare HMO | Admitting: Family Medicine

## 2019-09-30 ENCOUNTER — Encounter: Payer: Self-pay | Admitting: Cardiology

## 2019-09-30 VITALS — BP 110/62 | HR 78 | Ht 63.0 in | Wt 158.0 lb

## 2019-09-30 DIAGNOSIS — R0600 Dyspnea, unspecified: Secondary | ICD-10-CM | POA: Diagnosis not present

## 2019-09-30 DIAGNOSIS — I10 Essential (primary) hypertension: Secondary | ICD-10-CM

## 2019-09-30 DIAGNOSIS — R531 Weakness: Secondary | ICD-10-CM | POA: Diagnosis not present

## 2019-09-30 DIAGNOSIS — E538 Deficiency of other specified B group vitamins: Secondary | ICD-10-CM

## 2019-09-30 DIAGNOSIS — R0609 Other forms of dyspnea: Secondary | ICD-10-CM

## 2019-09-30 MED ORDER — CYANOCOBALAMIN 1000 MCG/ML IJ SOLN
1000.0000 ug | Freq: Once | INTRAMUSCULAR | Status: AC
  Administered 2019-09-30: 1000 ug via INTRAMUSCULAR

## 2019-09-30 NOTE — Progress Notes (Addendum)
Denise Ganci Smithis a 84 y.o.femalepresents to the office today for Vitamin B12injections, per physician's orders. Original order:07/13/19-B12 injections by nurse visit every 2 weeks x 4, then 1 times a month. She will need to follow-up 1 week after her last injection with provider and repeat laboratory results  # 1 of  1 monthly   Vitamin B12 1028mcg IMwas administered right deltoidtoday. Patient tolerated injection. Patient due for follow up labs/provider appt:Yes. Date due:1 week after last B12, appt made: Yes, 10/06/19 Patient next injection due:n/a  [name], Amanda screening examination/treatment/procedure(s) were performed by non-physician practitioner and as supervising physician I was immediately available for consultation/collaboration.  I agree with above assessment and plan.  Electronically Signed by: Howard Pouch, DO [facility]

## 2019-09-30 NOTE — Patient Instructions (Signed)

## 2019-09-30 NOTE — Progress Notes (Signed)
Cardiology Office Note:    Date:  09/30/2019   ID:  Denise Fuller, DOB 12-02-1935, MRN [id number]  PCP:  Ma Hillock, DO  Cardiologist:  Jenne Campus, MD    Referring MD: Ma Hillock, DO   No chief complaint on file. I am doing better  History of Present Illness:    Denise Fuller is a 84 y.o. female request medical history significant for essential hypertension, dyslipidemia.  2 months of follow-up.  Overall he is doing better.  She has more energy she is able to do more.  Recently she was identified to have B12 deficiency, she started having B12 supplementation.  Better.  Past Medical History:  Diagnosis Date  . Bartonella infection   . BCC (basal cell carcinoma of skin) 1997   nose  . Chickenpox   . Chronic venous insufficiency   . Diverticulosis   . Elevated alkaline phosphatase level   . Elevated LDH   . Environmental allergies   . GAD (generalized anxiety disorder)   . Goals of care, counseling/discussion 08/28/2019  . Groin abscess    culture negative, doxy resolved abscess  . Hepatitis A   . Hyperlipidemia   . Hypertension   . Hypothyroidism   . Lymphadenopathy, cervical    chronic per pt. (left)  . Macular degeneration   . Meniere disease   . MGUS (monoclonal gammopathy of unknown significance) 08/28/2019  . Thyroid disease     Past Surgical History:  Procedure Laterality Date  . ABDOMINAL HYSTERECTOMY  1991  . APPENDECTOMY  1967  . BASAL CELL CARCINOMA EXCISION  1997   nose  . BLEPHAROPLASTY    . CATARACT EXTRACTION W/ INTRAOCULAR LENS IMPLANT Bilateral Oct and Nov 2018  . CHOLECYSTECTOMY  1967    Current Medications: Current Meds  Medication Sig  . aspirin EC 81 MG tablet Take 81 mg by mouth daily.  . Biotin 5000 MCG TABS Take by mouth.   . calcium citrate-vitamin D (CITRACAL+D) 315-200 MG-UNIT tablet Take 1 tablet by mouth 2 (two) times daily.   . cetirizine (ZYRTEC) 10 MG tablet Take 10 mg by mouth as needed.   . Cholecalciferol  (VITAMIN D3) 1000 units CAPS Take by mouth. 2,000 units daily  . co-enzyme Q-10 30 MG capsule Take 30 mg by mouth 3 (three) times daily.   . Cyanocobalamin (B-12 COMPLIANCE INJECTION) 1000 MCG/ML KIT Inject as directed. 08/28/2019 Weekly  . DHA-EPA-Vitamin E (OMEGA-3 COMPLEX PO) Take 360 mg by mouth daily.  Marland Kitchen ELDERBERRY PO Take by mouth.  . escitalopram (LEXAPRO) 5 MG tablet Take 1 tablet (5 mg total) by mouth daily.  . Flaxseed, Linseed, (FLAX SEED OIL PO) Take 1,300 mg by mouth 2 (two) times daily.  Marland Kitchen glucosamine-chondroitin 500-400 MG tablet Take 1 tablet by mouth 3 (three) times daily.   Marland Kitchen levothyroxine (SYNTHROID) 112 MCG tablet 1 tablet daily on an empty stomach  . lisinopril (ZESTRIL) 5 MG tablet TAKE 1 TABLET EVERY DAY  . Polyethyl Glycol-Propyl Glycol (SYSTANE OP) Apply to eye.  . triamterene-hydrochlorothiazide (DYAZIDE) 37.5-25 MG capsule TAKE 1 CAPSULE EVERY DAY     Allergies:   Doxycycline, Keflex [cephalexin], and Iron   Social History   Socioeconomic History  . Marital status: Married    Spouse name: [name]  . Number of children: 3  . Years of education: Not on file  . Highest education level: Not on file  Occupational History  . Occupation: retired  Tobacco Use  . Smoking status:  Never Smoker  . Smokeless tobacco: Never Used  Vaping Use  . Vaping Use: Never used  Substance and Sexual Activity  . Alcohol use: No  . Drug use: No  . Sexual activity: Yes    Partners: Male    Comment: married  Other Topics Concern  . Not on file  Social History Narrative   Retired. Married to [place]. They have 3 children [name])   [place] from [place] 05/2014.    Has 2 older dogs.    Retired.    Drinks caffeine, takes herbal remedies and dialy vitamin.   Wears her seatbelt, smoke detector in the home   Wears dentures, no assistive devices for walking - independent.    Feels safe in her relationships.    Social Determinants of Health   Financial Resource Strain:    . Difficulty of Paying Living Expenses: Not on file  Food Insecurity:   . Worried About Charity fundraiser in the Last Year: Not on file  . Ran Out of Food in the Last Year: Not on file  Transportation Needs:   . Lack of Transportation (Medical): Not on file  . Lack of Transportation (Non-Medical): Not on file  Physical Activity:   . Days of Exercise per Week: Not on file  . Minutes of Exercise per Session: Not on file  Stress:   . Feeling of Stress : Not on file  Social Connections:   . Frequency of Communication with Friends and Family: Not on file  . Frequency of Social Gatherings with Friends and Family: Not on file  . Attends Religious Services: Not on file  . Active Member of Clubs or Organizations: Not on file  . Attends Archivist Meetings: Not on file  . Marital Status: Not on file     Family History: The patient's family history includes Aneurysm (age of onset: 85) in her father; Arthritis in her father and mother; Atrial fibrillation in her son; Heart disease in her mother; Stroke in her daughter; Thyroid disease in her daughter. ROS:   Please see the history of present illness.    All 14 point review of systems negative except as described per history of present illness  EKGs/Labs/Other Studies Reviewed:      Recent Labs: 06/22/2019: TSH 0.19 08/28/2019: ALT 16; BUN 18; Creatinine 1.07; Hemoglobin 12.8; Platelet Count 292; Potassium 3.5; Sodium 141  Recent Lipid Panel    Component Value Date/Time   CHOL 197 10/15/2018 0959   TRIG 108.0 10/15/2018 0959   HDL 64.60 10/15/2018 0959   CHOLHDL 3 10/15/2018 0959   VLDL 21.6 10/15/2018 0959   LDLCALC 111 (H) 10/15/2018 0959    Physical Exam:    VS:  BP 110/62   Pulse 78   Ht $R'5\' 3"'Ko$  (1.6 m)   Wt 158 lb (71.7 kg)   SpO2 97%   BMI 27.99 kg/m     Wt Readings from Last 3 Encounters:  09/30/19 158 lb (71.7 kg)  08/28/19 161 lb 1.3 oz (73.1 kg)  07/13/19 164 lb (74.4 kg)     GEN:  Well nourished,  well developed in no acute distress HEENT: Normal NECK: No JVD; No carotid bruits LYMPHATICS: No lymphadenopathy CARDIAC: RRR, no murmurs, no rubs, no gallops RESPIRATORY:  Clear to auscultation without rales, wheezing or rhonchi  ABDOMEN: Soft, non-tender, non-distended MUSCULOSKELETAL:  No edema; No deformity  SKIN: Warm and dry LOWER EXTREMITIES: no swelling NEUROLOGIC:  Alert and oriented x 3 PSYCHIATRIC:  Normal affect   ASSESSMENT:    1. Weakness generalized   2. Dyspnea on exertion   3. Essential hypertension    PLAN:    In order of problems listed above:  1. Generalized weakness much improved after B12 injections.  Did review echocardiogram with her which showed preserved left ventricle ejection fraction.  I told her I do not see any cardiac source of reason for her weakness and fatigue, overall she also seems to be improving which I think is related to B12 vitamin supplementation. 2. Dyspnea on exertion: Much better I encouraged him to be more active.  She reads a lot as well as trying to exercise which is absolutely essential. 3. Essential hypertension well-controlled.   Medication Adjustments/Labs and Tests Ordered: Current medicines are reviewed at length with the patient today.  Concerns regarding medicines are outlined above.  No orders of the defined types were placed in this encounter.  Medication changes: No orders of the defined types were placed in this encounter.   Signed, Park Liter, MD, [facility] 09/30/2019 1:24 PM    [place]

## 2019-10-06 ENCOUNTER — Other Ambulatory Visit: Payer: Self-pay

## 2019-10-06 ENCOUNTER — Telehealth: Payer: Self-pay | Admitting: Family Medicine

## 2019-10-06 ENCOUNTER — Encounter: Payer: Self-pay | Admitting: Family Medicine

## 2019-10-06 ENCOUNTER — Ambulatory Visit (INDEPENDENT_AMBULATORY_CARE_PROVIDER_SITE_OTHER): Payer: Medicare HMO | Admitting: Family Medicine

## 2019-10-06 VITALS — BP 101/68 | HR 78 | Temp 98.7°F | Resp 12 | Ht 63.0 in | Wt 162.6 lb

## 2019-10-06 DIAGNOSIS — E039 Hypothyroidism, unspecified: Secondary | ICD-10-CM

## 2019-10-06 DIAGNOSIS — R8290 Unspecified abnormal findings in urine: Secondary | ICD-10-CM

## 2019-10-06 DIAGNOSIS — E538 Deficiency of other specified B group vitamins: Secondary | ICD-10-CM | POA: Diagnosis not present

## 2019-10-06 DIAGNOSIS — R300 Dysuria: Secondary | ICD-10-CM | POA: Diagnosis not present

## 2019-10-06 LAB — POC URINALSYSI DIPSTICK (AUTOMATED)
Bilirubin, UA: NEGATIVE
Glucose, UA: NEGATIVE
Ketones, UA: NEGATIVE
Nitrite, UA: POSITIVE
Protein, UA: POSITIVE — AB
Spec Grav, UA: 1.015 (ref 1.010–1.025)
Urobilinogen, UA: 0.2 E.U./dL
pH, UA: 6.5 (ref 5.0–8.0)

## 2019-10-06 LAB — VITAMIN B12: Vitamin B-12: 1338 pg/mL — ABNORMAL HIGH (ref 211–911)

## 2019-10-06 LAB — T3, FREE: T3, Free: 3.1 pg/mL (ref 2.3–4.2)

## 2019-10-06 LAB — T4, FREE: Free T4: 1.13 ng/dL (ref 0.60–1.60)

## 2019-10-06 LAB — TSH: TSH: 0.73 u[IU]/mL (ref 0.35–4.50)

## 2019-10-06 MED ORDER — LEVOTHYROXINE SODIUM 112 MCG PO TABS: ORAL_TABLET | ORAL | 3 refills | Status: DC

## 2019-10-06 MED ORDER — SULFAMETHOXAZOLE-TRIMETHOPRIM 800-160 MG PO TABS
1.0000 | ORAL_TABLET | Freq: Two times a day (BID) | ORAL | 0 refills | Status: DC
Start: 2019-10-06 — End: 2020-03-23

## 2019-10-06 MED ORDER — ESCITALOPRAM OXALATE 5 MG PO TABS
5.0000 mg | ORAL_TABLET | Freq: Every day | ORAL | 1 refills | Status: DC
Start: 2019-10-06 — End: 2020-04-05

## 2019-10-06 NOTE — Patient Instructions (Signed)
Vitamin B12 Sublingual Drops  - ?2000 mcg or more can be bought on [employer].   We will collect the b12 levels, run your urine for infection and recheck your thyroid. We will call you with results.

## 2019-10-06 NOTE — Telephone Encounter (Signed)
Please call patient: Her B12 levels look great.  These make notations in the chart that she may continue B12 injections once monthly indefinitely by nurse appointment. Her thyroid levels are also great.  Continue current dose of 1 tab 6 times a week and 1/2  tab 1 time a week. Have sent her thyroid refills into her mail-in pharmacy.  Her urine in office tested still appear like it is infectious.  I did send it for culture.  I would encourage her to start the antibiotics prescribed today during her visit.  And we will call her with the urine culture results once received.

## 2019-10-06 NOTE — Telephone Encounter (Signed)
Notified pt of results and Rx being sent

## 2019-10-06 NOTE — Progress Notes (Signed)
Denise Fuller , December 21, 1935, 84 y.o., female MRN: [id number] Patient Care Team    Relationship Specialty Notifications Start End  Ma Hillock, DO PCP - General Family Medicine  01/10/16   Otelia Sergeant, OD Referring Physician   01/10/16    Comment: ophth- macular degeneration  [facility]  04/26/17     Chief Complaint  Patient presents with  . follow up b12  . Dysuria     Subjective: Denise Fuller is a 84 y.o. female present for follow-up on her fatigue.   She reports she is much improved today.  She feels the B12 shots have helped immensely.  She has been completing her B12 shots every 4 weeks.  She is also lower the dose of her thyroid to 1 tab of levothyroxine 112 mcg daily x6 days and a half a tablet on Sunday.  She is due for laboratory reevaluation of both her TSH and B12 levels today.  She has been evaluated by cardiology and they felt that she has no cardiac causes for her fatigue.  She is also been established with oncology secondary to her mildly abnormal SPEP/UPEP.  They recommend continuing SPEP every 6 months and if M spike rises to refer back to them. Prior note Patient presents today to follow-up on her lab results and discuss her fatigue.  Since her last appointment she has been seen by the cardiologist who plans to perform an echo.  Today we discussed her laboratory abnormalities with a low TSH, mildly low albumin with positive light chains on SPEP, B12 deficiency, elevated alk phos, x-ray results of old T12 compression fracture and degenerative disc disease, normal: Vitamin D, iron, CBC, PTH calcium, GGT and urine culture. Prior note: Patient describes her fatigue as "severe.  "She had a history of low iron, was unable to tolerate iron supplements but has been increasing iron in her diet.  Her iron has been within normal limits since.  She has a history of hypothyroidism.  Doses have been altered of her levothyroxine, with normal thyroid function last  02/11/2019.  Liver and kidney function, along with electrolytes have been normal last fall 2020 with an elevated alk phos.  Her calcium levels have been normal.  Blood cell counts have been normal.  A1c normal.  Patient reports he supplements with 1000 units of vitamin D daily. She does endorse having some stress.  She does not sleep as well as she would like.  She was unable to tolerate trazodone that she reports she did not like the way it made her feel and it did not work well for her.  She does admit she thinks she does not sleep as well because she is worried about needing to get up with her husband to help care for him. She also endorses low back discomfort, generalized fatigue, lightheadedness and occasional palpitations.  Her son has been treated for atrial fibrillation.  Her daughter was thought to possibly have A. Fib that caused her heart problems.  Her father has a history of aneurysms of the brain. Patient has a history of Mnire's disease and on HCTZ.  Blood pressure has been well controlled on HCTZ/lisinopril.  She takes melatonin and Zyrtec nightly.  She also complains of dysuria.  She states she has had a urinary tract infection over the last 2 weeks.  She was seen at the urgent care and prescribed Macrobid twice daily x5 days.  She still has continued symptoms and would like  to have her urine checked today.  Typically patient responds well to Bactrim.  Depression screen [facility] 2/9 10/15/2018 04/10/2018 04/26/2017 04/23/2016 01/10/2016  Decreased Interest 0 0 0 0 0  Down, Depressed, Hopeless 0 0 0 0 0  PHQ - 2 Score 0 0 0 0 0  Altered sleeping - 0 - - -  Tired, decreased energy - 0 - - -  Change in appetite - 0 - - -  Feeling bad or failure about yourself  - 0 - - -  Trouble concentrating - 0 - - -  Moving slowly or fidgety/restless - 0 - - -  Suicidal thoughts - 0 - - -  PHQ-9 Score - 0 - - -  Difficult doing work/chores - Not difficult at all - - -    Allergies  Allergen Reactions  .  Doxycycline     GI upset  . Keflex [Cephalexin] Hives and Itching  . Iron Rash   Social History   Social History Narrative   Retired. Married to Martin. They have 3 children [name])   [place] from [place] 05/2014.    Has 2 older dogs.    Retired.    Drinks caffeine, takes herbal remedies and dialy vitamin.   Wears her seatbelt, smoke detector in the home   Wears dentures, no assistive devices for walking - independent.    Feels safe in her relationships.    Past Medical History:  Diagnosis Date  . Bartonella infection   . BCC (basal cell carcinoma of skin) 1997   nose  . Chickenpox   . Chronic venous insufficiency   . Diverticulosis   . Elevated alkaline phosphatase level   . Elevated LDH   . Environmental allergies   . GAD (generalized anxiety disorder)   . Goals of care, counseling/discussion 08/28/2019  . Groin abscess    culture negative, doxy resolved abscess  . Hepatitis A   . Hyperlipidemia   . Hypertension   . Hypothyroidism   . Lymphadenopathy, cervical    chronic per pt. (left)  . Macular degeneration   . Meniere disease   . MGUS (monoclonal gammopathy of unknown significance) 08/28/2019  . Thyroid disease    Past Surgical History:  Procedure Laterality Date  . ABDOMINAL HYSTERECTOMY  1991  . APPENDECTOMY  1967  . BASAL CELL CARCINOMA EXCISION  1997   nose  . BLEPHAROPLASTY    . CATARACT EXTRACTION W/ INTRAOCULAR LENS IMPLANT Bilateral Oct and Nov 2018  . CHOLECYSTECTOMY  1967   Family History  Problem Relation Age of Onset  . Arthritis Mother   . Heart disease Mother   . Arthritis Father   . Aneurysm Father 18       brain  . Thyroid disease Daughter   . Stroke Daughter        2020  . Atrial fibrillation Son        2020    All past medical history, surgical history, allergies, family history, immunizations andmedications were updated in the EMR today and reviewed under the history and medication portions of their EMR.     ROS:  Negative, with the exception of above mentioned in HPI   Objective:  BP 101/68 (BP Location: Left Arm, Cuff Size: Large)   Pulse 78   Temp 98.7 F (37.1 C) (Oral)   Resp 12   Ht _0  (1.6 m)   Wt 162 lb 9.6 oz (73.8 kg)   SpO2 96%   BMI 28.80  kg/m  Body mass index is 28.8 kg/m. Gen: Afebrile. No acute distress.  HENT: AT. [place].  Eyes:Pupils Equal Round Reactive to light, Extraocular movements intact,  Conjunctiva without redness, discharge or icterus. CV: RRR no murmur, no edema, +2/4 P posterior tibialis pulses Chest: CTAB, no wheeze or crackles Abd: Soft. NTND. BS present.   MSK: no cva tenderness Skin: no rashes, purpura or petechiae.  Neuro: Normal gait. PERLA. EOMi. Alert. Oriented x3 Psych: Normal affect, dress and demeanor. Normal speech. Normal thought content and judgment.    No exam data present No results found. Results for orders placed or performed in visit on 10/06/19 (from the past 24 hour(s))  TSH     Status: None   Collection Time: 10/06/19 10:02 AM  Result Value Ref Range   TSH 0.73 0.35 - 4.50 uIU/mL  T4, free     Status: None   Collection Time: 10/06/19 10:02 AM  Result Value Ref Range   Free T4 1.13 0.60 - 1.60 ng/dL  T3, free     Status: None   Collection Time: 10/06/19 10:02 AM  Result Value Ref Range   T3, Free 3.1 2.3 - 4.2 pg/mL  B12     Status: Abnormal   Collection Time: 10/06/19 10:02 AM  Result Value Ref Range   Vitamin B-12 1,338 (H) 211 - 911 pg/mL  POCT Urinalysis Dipstick (Automated)     Status: Abnormal   Collection Time: 10/06/19 10:18 AM  Result Value Ref Range   Color, UA yellow    Clarity, UA cloudy    Glucose, UA Negative Negative   Bilirubin, UA negative    Ketones, UA negative    Spec Grav, UA 1.015 1.010 - 1.025   Blood, UA 2+    pH, UA 6.5 5.0 - 8.0   Protein, UA Positive (A) Negative   Urobilinogen, UA 0.2 0.2 or 1.0 E.U./dL   Nitrite, UA positive    Leukocytes, UA Moderate (2+) (A) Negative     Assessment/Plan: Denise Fuller is a 84 y.o. female present for OV for  Hypothyroidism, unspecified type Fatigue improved Labs mildly oversupplemented.  Last appointment decreased weekly total of levothyroxine.  Dosages remain the same levothyroxine 112 mcg daily, except for 1 day a week take half a tab. TSH, T3 free, T4 free collected today.  Medications will be refilled once results received to ensure proper dosage.  fatigue/B12 deficiency -Fatigue improved. - B12 levels collected today.  If adequately supplemented patient may continue B12 injections monthly per nurse visit indefinitely.  Chronic bilateral low back pain without sciatica Discussed trial of anti-inflammatory daily medication and she states she is doing okay by taking Advil 1 time per day.  She would like to wait on any type of referral.  She does have an old compression fraction in her back at T12 that may be causing discomfort.  Anxiety: Stable.  Tolerating Lexapro.  Follow-up 5.5 months, sooner if needed.   Reviewed expectations re: course of current medical issues.  Discussed self-management of symptoms.  Outlined signs and symptoms indicating need for more acute intervention.  Patient verbalized understanding and all questions were answered.  Patient received an After-Visit Summary.   Orders Placed This Encounter  Procedures  . TSH  . T4, free  . T3, free  . B12  . Urinalysis w microscopic + reflex cultur  . POCT Urinalysis Dipstick (Automated)   Meds ordered this encounter  Medications  . sulfamethoxazole-trimethoprim (BACTRIM DS) 800-160 MG tablet  Sig: Take 1 tablet by mouth 2 (two) times daily.    Dispense:  14 tablet    Refill:  0  . escitalopram (LEXAPRO) 5 MG tablet    Sig: Take 1 tablet (5 mg total) by mouth daily.    Dispense:  90 tablet    Refill:  1   Referral Orders  No referral(s) requested today     Note is dictated utilizing voice recognition software. Although note  has been proof read prior to signing, occasional typographical errors still can be missed. If any questions arise, please do not hesitate to call for verification.   electronically signed by:  Howard Pouch, DO  [place]

## 2019-10-09 LAB — URINE CULTURE
MICRO NUMBER:: 10896348
SPECIMEN QUALITY:: ADEQUATE

## 2019-10-09 LAB — URINALYSIS W MICROSCOPIC + REFLEX CULTURE
Bilirubin Urine: NEGATIVE
Glucose, UA: NEGATIVE
Hyaline Cast: NONE SEEN /LPF
Ketones, ur: NEGATIVE
Nitrites, Initial: POSITIVE — AB
Specific Gravity, Urine: 1.014 (ref 1.001–1.03)
Squamous Epithelial / HPF: NONE SEEN /HPF (ref <=5)
WBC, UA: >=60 /HPF — AB (ref 0–5)
pH: 7 (ref 5.0–8.0)

## 2019-10-09 LAB — CULTURE INDICATED

## 2019-10-09 NOTE — Progress Notes (Signed)
Notified pt of lab results 

## 2019-10-09 NOTE — Progress Notes (Signed)
Attempted to contact pt and could not leave message

## 2019-11-02 ENCOUNTER — Other Ambulatory Visit: Payer: Self-pay

## 2019-11-02 ENCOUNTER — Ambulatory Visit (INDEPENDENT_AMBULATORY_CARE_PROVIDER_SITE_OTHER): Payer: Medicare HMO

## 2019-11-02 DIAGNOSIS — E538 Deficiency of other specified B group vitamins: Secondary | ICD-10-CM | POA: Diagnosis not present

## 2019-11-02 DIAGNOSIS — Z23 Encounter for immunization: Secondary | ICD-10-CM

## 2019-11-02 MED ORDER — CYANOCOBALAMIN 1000 MCG/ML IJ SOLN
1000.0000 ug | Freq: Once | INTRAMUSCULAR | Status: AC
  Administered 2019-11-02: 1000 ug via INTRAMUSCULAR

## 2019-12-02 ENCOUNTER — Ambulatory Visit (INDEPENDENT_AMBULATORY_CARE_PROVIDER_SITE_OTHER): Payer: Medicare HMO

## 2019-12-02 ENCOUNTER — Other Ambulatory Visit: Payer: Self-pay

## 2019-12-02 DIAGNOSIS — E538 Deficiency of other specified B group vitamins: Secondary | ICD-10-CM | POA: Diagnosis not present

## 2019-12-02 MED ORDER — CYANOCOBALAMIN 1000 MCG/ML IJ SOLN
1000.0000 ug | Freq: Once | INTRAMUSCULAR | Status: AC
  Administered 2019-12-02: 1000 ug via INTRAMUSCULAR

## 2019-12-02 NOTE — Progress Notes (Signed)
Denise Fuller is a 84 y.o. female presents to the office today for Vitamin B12 injections, per physician's orders. Original order: 07/13/19 Vitamin b12 107mcg was administered left deltoid today. Patient tolerated injection. Patient due for follow up labs/provider appt: No. Date due: n/a, appt made No Patient next injection due: 01/02/20, appt made Yes  Octaviano Glow

## 2019-12-03 ENCOUNTER — Ambulatory Visit: Payer: Medicare HMO | Attending: Internal Medicine

## 2019-12-03 DIAGNOSIS — Z23 Encounter for immunization: Secondary | ICD-10-CM

## 2019-12-03 NOTE — Progress Notes (Signed)
   Covid-19 Vaccination Clinic  Name:  Denise Fuller    MRN: [id number] DOB: [birth year]  12/03/2019  [name] was observed post Covid-19 immunization for 15 minutes without incident. She was provided with Vaccine Information Sheet and instruction to access the V-Safe system.   [name] was instructed to call 911 with any severe reactions post vaccine: Marland Kitchen Difficulty breathing  . Swelling of face and throat  . A fast heartbeat  . A bad rash all over body  . Dizziness and weakness

## 2019-12-10 ENCOUNTER — Other Ambulatory Visit: Payer: Self-pay

## 2019-12-10 DIAGNOSIS — I10 Essential (primary) hypertension: Secondary | ICD-10-CM

## 2019-12-10 MED ORDER — LISINOPRIL 5 MG PO TABS: 5.0000 mg | ORAL_TABLET | Freq: Every day | ORAL | 1 refills | Status: DC

## 2019-12-10 MED ORDER — TRIAMTERENE-HCTZ 37.5-25 MG PO CAPS: 1.0000 | ORAL_CAPSULE | Freq: Every day | ORAL | 1 refills | Status: DC

## 2019-12-28 ENCOUNTER — Ambulatory Visit (INDEPENDENT_AMBULATORY_CARE_PROVIDER_SITE_OTHER): Payer: Medicare HMO

## 2019-12-28 ENCOUNTER — Other Ambulatory Visit: Payer: Self-pay

## 2019-12-28 DIAGNOSIS — E538 Deficiency of other specified B group vitamins: Secondary | ICD-10-CM

## 2019-12-28 MED ORDER — CYANOCOBALAMIN 1000 MCG/ML IJ SOLN
1000.0000 ug | Freq: Once | INTRAMUSCULAR | Status: AC
  Administered 2019-12-28: 1000 ug via INTRAMUSCULAR

## 2019-12-28 NOTE — Progress Notes (Signed)
[name] is a 84 y.o. female presents to the office today for Vitamin B12 injections, per physician's orders. Original order: 07/13/19 Vitamin b12 1010mcg was administered left deltoid today. Patient tolerated injection. Patient due for follow up labs/provider appt: No. Date due: n/a, appt made No Patient next injection due: 01/27/20, appt made No  Denise Fuller

## 2020-02-18 ENCOUNTER — Ambulatory Visit (INDEPENDENT_AMBULATORY_CARE_PROVIDER_SITE_OTHER): Payer: Medicare HMO

## 2020-02-18 ENCOUNTER — Other Ambulatory Visit: Payer: Self-pay

## 2020-02-18 DIAGNOSIS — E538 Deficiency of other specified B group vitamins: Secondary | ICD-10-CM

## 2020-02-18 MED ORDER — CYANOCOBALAMIN 1000 MCG/ML IJ SOLN
1000.0000 ug | Freq: Once | INTRAMUSCULAR | Status: AC
  Administered 2020-02-18: 1000 ug via INTRAMUSCULAR

## 2020-02-18 NOTE — Progress Notes (Signed)
Patient here for monthly b12 injection per Dr. [name].  Injection given in left deltoid and patient tolerated well. 

## 2020-03-21 ENCOUNTER — Ambulatory Visit: Payer: Medicare HMO

## 2020-03-21 DIAGNOSIS — H43813 Vitreous degeneration, bilateral: Secondary | ICD-10-CM | POA: Diagnosis not present

## 2020-03-21 DIAGNOSIS — H353112 Nonexudative age-related macular degeneration, right eye, intermediate dry stage: Secondary | ICD-10-CM | POA: Diagnosis not present

## 2020-03-21 DIAGNOSIS — H353123 Nonexudative age-related macular degeneration, left eye, advanced atrophic without subfoveal involvement: Secondary | ICD-10-CM | POA: Diagnosis not present

## 2020-03-21 DIAGNOSIS — H35423 Microcystoid degeneration of retina, bilateral: Secondary | ICD-10-CM | POA: Diagnosis not present

## 2020-03-22 NOTE — Progress Notes (Signed)
Subjective:   KAELA BEITZ is a 85 y.o. female who presents for Medicare Annual (Subsequent) preventive examination.  Review of Systems     Cardiac Risk Factors include: advanced age (>39men, >46 women);hypertension;dyslipidemia     Objective:    Today's Vitals   03/23/20 0846  BP: 128/78  Pulse: 77  Resp: 16  Temp: 98.1 F (36.7 C)  TempSrc: Oral  SpO2: 98%  Weight: 156 lb 6.4 oz (70.9 kg)  Height: $Remove'5\' 3"'vMNNZRQ$  (1.6 m)   Body mass index is 27.71 kg/m.  Advanced Directives 03/23/2020 08/28/2019 04/26/2017  Does Patient Have a Medical Advance Directive? Yes Yes Yes  Type of Paramedic of [place];Living will [place];Living will [place];Living will  Copy of [place] in Chart? No - copy requested - No - copy requested    Current Medications (verified) Outpatient Encounter Medications as of 03/23/2020  Medication Sig   aspirin EC 81 MG tablet Take 81 mg by mouth daily.   Biotin 5000 MCG TABS Take by mouth.    calcium citrate-vitamin D (CITRACAL+D) 315-200 MG-UNIT tablet Take 1 tablet by mouth 2 (two) times daily.    cetirizine (ZYRTEC) 10 MG tablet Take 10 mg by mouth as needed.    Cholecalciferol (VITAMIN D3) 1000 units CAPS Take by mouth. 2,000 units daily   co-enzyme Q-10 30 MG capsule Take 30 mg by mouth 3 (three) times daily.    Cyanocobalamin (B-12 COMPLIANCE INJECTION) 1000 MCG/ML KIT Inject as directed. 08/28/2019 Weekly   DHA-EPA-Vitamin E (OMEGA-3 COMPLEX PO) Take 360 mg by mouth daily.   ELDERBERRY PO Take by mouth.   escitalopram (LEXAPRO) 5 MG tablet Take 1 tablet (5 mg total) by mouth daily.   Flaxseed, Linseed, (FLAX SEED OIL PO) Take 1,300 mg by mouth 2 (two) times daily.   glucosamine-chondroitin 500-400 MG tablet Take 1 tablet by mouth 3 (three) times daily.    levothyroxine (SYNTHROID) 112 MCG tablet 1 tablet daily on an empty stomach 6 days a week and 1/2 tablet  on Sundays   lisinopril (ZESTRIL) 5 MG tablet Take 1 tablet (5 mg total) by mouth daily.   Polyethyl Glycol-Propyl Glycol (SYSTANE OP) Apply to eye.   triamterene-hydrochlorothiazide (DYAZIDE) 37.5-25 MG capsule Take 1 each (1 capsule total) by mouth daily.   [DISCONTINUED] sulfamethoxazole-trimethoprim (BACTRIM DS) 800-160 MG tablet Take 1 tablet by mouth 2 (two) times daily.   No facility-administered encounter medications on file as of 03/23/2020.    Allergies (verified) Doxycycline, Keflex [cephalexin], and Iron   History: Past Medical History:  Diagnosis Date   Bartonella infection    BCC (basal cell carcinoma of skin) 1997   nose   Chickenpox    Chronic venous insufficiency    Diverticulosis    Elevated alkaline phosphatase level    Elevated LDH    Environmental allergies    GAD (generalized anxiety disorder)    Goals of care, counseling/discussion 08/28/2019   Groin abscess    culture negative, doxy resolved abscess   Hepatitis A    Hyperlipidemia    Hypertension    Hypothyroidism    Lymphadenopathy, cervical    chronic per pt. (left)   Macular degeneration    Meniere disease    MGUS (monoclonal gammopathy of unknown significance) 08/28/2019   Thyroid disease    Past Surgical History:  Procedure Laterality Date   ABDOMINAL HYSTERECTOMY  1991   APPENDECTOMY  1967   BASAL CELL CARCINOMA  EXCISION  1997   nose   BLEPHAROPLASTY     CATARACT EXTRACTION W/ INTRAOCULAR LENS IMPLANT Bilateral Oct and Nov 2018   CHOLECYSTECTOMY  1967   Family History  Problem Relation Age of Onset   Arthritis Mother    Heart disease Mother    Arthritis Father    Aneurysm Father 20       brain   Thyroid disease Daughter    Stroke Daughter        2020   Atrial fibrillation Son        2020   Social History   Socioeconomic History   Marital status: Married    Spouse name: [name]   Number of children: 3   Years of education: Not on file    Highest education level: Not on file  Occupational History   Occupation: retired  Tobacco Use   Smoking status: Never Smoker   Smokeless tobacco: Never Used  Scientific laboratory technician Use: Never used  Substance and Sexual Activity   Alcohol use: No   Drug use: No   Sexual activity: Yes    Partners: Male    Comment: married  Other Topics Concern   Not on file  Social History Narrative   Retired. Married to [place]. They have 3 children [name])   [place] from [place] 05/2014.    Has 2 older dogs.    Retired.    Drinks caffeine, takes herbal remedies and dialy vitamin.   Wears her seatbelt, smoke detector in the home   Wears dentures, no assistive devices for walking - independent.    Feels safe in her relationships.    Social Determinants of Health   Financial Resource Strain: Low Risk    Difficulty of Paying Living Expenses: Not hard at all  Food Insecurity: No Food Insecurity   Worried About Charity fundraiser in the Last Year: Never true   [place] in the Last Year: Never true  Transportation Needs: No Transportation Needs   Lack of Transportation (Medical): No   Lack of Transportation (Non-Medical): No  Physical Activity: Insufficiently Active   Days of Exercise per Week: 6 days   Minutes of Exercise per Session: 20 min  Stress: No Stress Concern Present   Feeling of Stress : Not at all  Social Connections: Moderately Isolated   Frequency of Communication with Friends and Family: More than three times a week   Frequency of Social Gatherings with Friends and Family: More than three times a week   Attends Religious Services: Never   Marine scientist or Organizations: No   Attends Music therapist: Never   Marital Status: Married    Tobacco Counseling Counseling given: Not Answered   Clinical Intake:  Pre-visit preparation completed: Yes  Pain : No/denies pain     Nutritional Status: BMI 25 -29  Overweight Nutritional Risks: None Diabetes: No  How often do you need to have someone help you when you read instructions, pamphlets, or other written materials from your doctor or pharmacy?: 1 - Never  Diabetic?No  Interpreter Needed?: No  Information entered by :: Caroleen Hamman LPN   Activities of Daily Living In your present state of health, do you have any difficulty performing the following activities: 03/23/2020  Hearing? N  Vision? N  Difficulty concentrating or making decisions? N  Walking or climbing stairs? N  Dressing or bathing? N  Doing errands, shopping? N  Preparing Food  and eating ? N  Using the Toilet? N  In the past six months, have you accidently leaked urine? Y  Comment occasionally  Do you have problems with loss of bowel control? N  Managing your Medications? N  Managing your Finances? N  Housekeeping or managing your Housekeeping? N  Some recent data might be hidden    Patient Care Team: Ma Hillock, DO as PCP - General (Family Medicine) Otelia Sergeant, OD as Referring Physician [facility] (Dentistry)  Indicate any recent Medical Services you may have received from other than [facility] providers in the past year (date may be approximate).     Assessment:   This is a routine wellness examination for Alajiah.  Hearing/Vision screen  Hearing Screening   '125Hz'$  $Remo'250Hz'ZtMbi$'500Hz'$'1000Hz'$'2000Hz'$'3000Hz'$'4000Hz'$'6000Hz'$'8000Hz'$   Right ear:           Left ear:           Comments: Mild hearing loss  Vision Screening Comments: Wears glasses Last eye exam-03/2019-Dr. Oswaldo Conroy  Dietary issues and exercise activities discussed: Current Exercise Habits: Home exercise routine, Type of exercise: walking, Time (Minutes): 20, Frequency (Times/Week): 6, Weekly Exercise (Minutes/Week): 120, Intensity: Mild, Exercise limited by: None identified  Goals     Patient Stated     Maintain current health by staying active.       Depression Screen PHQ 2/9 Scores 03/23/2020  10/15/2018 04/10/2018 04/26/2017 04/23/2016 01/10/2016  PHQ - 2 Score 0 0 0 0 0 0  PHQ- 9 Score - - 0 - - -    Fall Risk Fall Risk  03/23/2020 10/15/2018 04/26/2017 04/23/2016 01/10/2016  Falls in the past year? 0 1 Yes No No  Comment - - "fell", landed on furniture - -  Number falls in past yr: 0 0 1 - -  Injury with Fall? 0 0 No - -  Risk for fall due to : - History of fall(s);Impaired vision;Medication side effect Impaired balance/gait - -  Follow up Falls prevention discussed Falls evaluation completed;Education provided;Falls prevention discussed Falls prevention discussed - -    FALL RISK PREVENTION PERTAINING TO THE HOME:  Any stairs in or around the home? Yes  If so, are there any without handrails? No  Home free of loose throw rugs in walkways, pet beds, electrical cords, etc? Yes  Adequate lighting in your home to reduce risk of falls? Yes   ASSISTIVE DEVICES UTILIZED TO PREVENT FALLS:  Life alert? No  Use of a cane, walker or w/c? No  Grab bars in the bathroom? Yes  Shower chair or bench in shower? No  Elevated toilet seat or a handicapped toilet? No   TIMED UP AND GO:  Was the test performed? Yes .  Length of time to ambulate 10 feet: 10 sec.   Gait steady and fast without use of assistive device  Cognitive Function:Normal cognitive status assessed by direct observation by this Nurse Health Advisor. No abnormalities found.          Immunizations Immunization History  Administered Date(s) Administered   Fluad Quad(high Dose 65+) 10/15/2018, 11/02/2019   Influenza, High Dose Seasonal PF 01/10/2016, 11/08/2016, 11/27/2017   Influenza-Unspecified 10/27/2014, 11/08/2016   PFIZER(Purple Top)SARS-COV-2 Vaccination 04/20/2019, 05/25/2019, 12/03/2019   Pneumococcal Conjugate-13 10/27/2014   Zoster Recombinat (Shingrix) 09/20/2017, 01/05/2018    TDAP status: Due, Education has been provided regarding the importance of this vaccine. Advised may receive this vaccine at  local pharmacy or Health Dept. Aware to provide  a copy of the vaccination record if obtained from local pharmacy or Health Dept. Verbalized acceptance and understanding.  Flu Vaccine status: Up to date  Pneumococcal vaccine status: Up to date  Covid-19 vaccine status: Completed vaccines  Qualifies for Shingles Vaccine? No   Zostavax completed No   Shingrix Completed?: Yes  Screening Tests Health Maintenance  Topic Date Due   TETANUS/TDAP  02/06/2020   COVID-19 Vaccine (4 - Booster for Pfizer series) 06/02/2020   INFLUENZA VACCINE  Completed   DEXA SCAN  Completed   PNA vac Low Risk Adult  Completed    Health Maintenance  Health Maintenance Due  Topic Date Due   TETANUS/TDAP  02/06/2020    Colorectal cancer screening: No longer required.   Mammogram status: No longer required due to patient declines.  Bone Density status: Patient declined  Lung Cancer Screening: (Low Dose CT Chest recommended if Age 11-80 years, 30 pack-year currently smoking OR have quit w/in 15years.) does not qualify.    Additional Screening:  Hepatitis C Screening: does not qualify  Vision Screening: Recommended annual ophthalmology exams for early detection of glaucoma and other disorders of the eye. Is the patient up to date with their annual eye exam?  Yes  Who is the provider or what is the name of the office in which the patient attends annual eye exams? Dr. Oswaldo Conroy    Dental Screening: Recommended annual dental exams for proper oral hygiene  Community Resource Referral / Chronic Care Management: CRR required this visit?  No   CCM required this visit?  No      Plan:     I have personally reviewed and noted the following in the patients chart:    Medical and social history  Use of alcohol, tobacco or illicit drugs   Current medications and supplements  Functional ability and status  Nutritional status  Physical activity  Advanced directives  List of other  physicians  Hospitalizations, surgeries, and ER visits in previous 12 months  Vitals  Screenings to include cognitive, depression, and falls  Referrals and appointments  In addition, I have reviewed and discussed with patient certain preventive protocols, quality metrics, and best practice recommendations. A written personalized care plan for preventive services as well as general preventive health recommendations were provided to patient.   Patient would like to access avs on mychart  Marta Antu, [place]   [id number]  Nurse Health Advisor  Nurse Notes: None

## 2020-03-23 ENCOUNTER — Ambulatory Visit (INDEPENDENT_AMBULATORY_CARE_PROVIDER_SITE_OTHER): Payer: Medicare HMO

## 2020-03-23 ENCOUNTER — Other Ambulatory Visit: Payer: Self-pay

## 2020-03-23 VITALS — BP 128/78 | HR 77 | Temp 98.1°F | Resp 16 | Ht 63.0 in | Wt 156.4 lb

## 2020-03-23 DIAGNOSIS — Z0000 Encounter for general adult medical examination without abnormal findings: Secondary | ICD-10-CM

## 2020-03-23 DIAGNOSIS — E538 Deficiency of other specified B group vitamins: Secondary | ICD-10-CM

## 2020-03-23 MED ORDER — CYANOCOBALAMIN 1000 MCG/ML IJ SOLN
1000.0000 ug | Freq: Once | INTRAMUSCULAR | Status: AC
  Administered 2020-03-23: 1000 ug via INTRAMUSCULAR

## 2020-03-23 NOTE — Progress Notes (Signed)
Denise Fulleris a 85 y.o.femalepresents to the office today for Vitamin B12injections, per physician's orders. Original order:07/13/19 Vitamin b12 1000mcgwas administered left deltoidtoday. Patient tolerated injection.   Denise Fuller J Denise Fuller 

## 2020-03-23 NOTE — Patient Instructions (Signed)
Denise Fuller , Thank you for taking time to come for your Medicare Wellness Visit. I appreciate your ongoing commitment to your health goals. Please review the following plan we discussed and let me know if I can assist you in the future.   Screening recommendations/referrals: Colonoscopy: No longer required Mammogram: Declined Bone Density: Declined Recommended yearly ophthalmology/optometry visit for glaucoma screening and checkup Recommended yearly dental visit for hygiene and checkup  Vaccinations: Influenza vaccine: Up  To date Pneumococcal vaccine: Completed vaccines Tdap vaccine: Discuss with pharmacy Shingles vaccine: Completed vaccines  Covid-19:Completed vaccines  Advanced directives: Please bring a copy for your chart  Conditions/risks identified: See problem list  Next appointment: Follow up in one year for your annual wellness visit 03/29/2021 @ 9:00   Preventive Care 83 Years and Older, Female Preventive care refers to lifestyle choices and visits with your health care provider that can promote health and wellness. What does preventive care include?  A yearly physical exam. This is also called an annual well check.  Dental exams once or twice a year.  Routine eye exams. Ask your health care provider how often you should have your eyes checked.  Personal lifestyle choices, including:  Daily care of your teeth and gums.  Regular physical activity.  Eating a healthy diet.  Avoiding tobacco and drug use.  Limiting alcohol use.  Practicing safe sex.  Taking low-dose aspirin every day.  Taking vitamin and mineral supplements as recommended by your health care provider. What happens during an annual well check? The services and screenings done by your health care provider during your annual well check will depend on your age, overall health, lifestyle risk factors, and family history of disease. Counseling  Your health care provider may ask you questions about  your:  Alcohol use.  Tobacco use.  Drug use.  Emotional well-being.  Home and relationship well-being.  Sexual activity.  Eating habits.  History of falls.  Memory and ability to understand (cognition).  Work and work Statistician.  Reproductive health. Screening  You may have the following tests or measurements:  Height, weight, and BMI.  Blood pressure.  Lipid and cholesterol levels. These may be checked every 5 years, or more frequently if you are over 9 years old.  Skin check.  Lung cancer screening. You may have this screening every year starting at age 56 if you have a 30-pack-year history of smoking and currently smoke or have quit within the past 15 years.  Fecal occult blood test (FOBT) of the stool. You may have this test every year starting at age 36.  Flexible sigmoidoscopy or colonoscopy. You may have a sigmoidoscopy every 5 years or a colonoscopy every 10 years starting at age 31.  Hepatitis C blood test.  Hepatitis B blood test.  Sexually transmitted disease (STD) testing.  Diabetes screening. This is done by checking your blood sugar (glucose) after you have not eaten for a while (fasting). You may have this done every 1-3 years.  Bone density scan. This is done to screen for osteoporosis. You may have this done starting at age 26.  Mammogram. This may be done every 1-2 years. Talk to your health care provider about how often you should have regular mammograms. Talk with your health care provider about your test results, treatment options, and if necessary, the need for more tests. Vaccines  Your health care provider may recommend certain vaccines, such as:  Influenza vaccine. This is recommended every year.  Tetanus, diphtheria, and acellular  pertussis (Tdap, Td) vaccine. You may need a Td booster every 10 years.  Zoster vaccine. You may need this after age 60.  Pneumococcal 13-valent conjugate (PCV13) vaccine. One dose is recommended  after age 73.  Pneumococcal polysaccharide (PPSV23) vaccine. One dose is recommended after age 65. Talk to your health care provider about which screenings and vaccines you need and how often you need them. This information is not intended to replace advice given to you by your health care provider. Make sure you discuss any questions you have with your health care provider. Document Released: 02/18/2015 Document Revised: 10/12/2015 Document Reviewed: 11/23/2014 Elsevier Interactive Patient Education  2017 [place] Prevention in the Home Falls can cause injuries. They can happen to people of all ages. There are many things you can do to make your home safe and to help prevent falls. What can I do on the outside of my home?  Regularly fix the edges of walkways and driveways and fix any cracks.  Remove anything that might make you trip as you walk through a door, such as a raised step or threshold.  Trim any bushes or trees on the path to your home.  Use bright outdoor lighting.  Clear any walking paths of anything that might make someone trip, such as rocks or tools.  Regularly check to see if handrails are loose or broken. Make sure that both sides of any steps have handrails.  Any raised decks and porches should have guardrails on the edges.  Have any leaves, snow, or ice cleared regularly.  Use sand or salt on walking paths during winter.  Clean up any spills in your garage right away. This includes oil or grease spills. What can I do in the bathroom?  Use night lights.  Install grab bars by the toilet and in the tub and shower. Do not use towel bars as grab bars.  Use non-skid mats or decals in the tub or shower.  If you need to sit down in the shower, use a plastic, non-slip stool.  Keep the floor dry. Clean up any water that spills on the floor as soon as it happens.  Remove soap buildup in the tub or shower regularly.  Attach bath mats securely with  double-sided non-slip rug tape.  Do not have throw rugs and other things on the floor that can make you trip. What can I do in the bedroom?  Use night lights.  Make sure that you have a light by your bed that is easy to reach.  Do not use any sheets or blankets that are too big for your bed. They should not hang down onto the floor.  Have a firm chair that has side arms. You can use this for support while you get dressed.  Do not have throw rugs and other things on the floor that can make you trip. What can I do in the kitchen?  Clean up any spills right away.  Avoid walking on wet floors.  Keep items that you use a lot in easy-to-reach places.  If you need to reach something above you, use a strong step stool that has a grab bar.  Keep electrical cords out of the way.  Do not use floor polish or wax that makes floors slippery. If you must use wax, use non-skid floor wax.  Do not have throw rugs and other things on the floor that can make you trip. What can I do with my stairs?  Do not leave any items on the stairs.  Make sure that there are handrails on both sides of the stairs and use them. Fix handrails that are broken or loose. Make sure that handrails are as long as the stairways.  Check any carpeting to make sure that it is firmly attached to the stairs. Fix any carpet that is loose or worn.  Avoid having throw rugs at the top or bottom of the stairs. If you do have throw rugs, attach them to the floor with carpet tape.  Make sure that you have a light switch at the top of the stairs and the bottom of the stairs. If you do not have them, ask someone to add them for you. What else can I do to help prevent falls?  Wear shoes that:  Do not have high heels.  Have rubber bottoms.  Are comfortable and fit you well.  Are closed at the toe. Do not wear sandals.  If you use a stepladder:  Make sure that it is fully opened. Do not climb a closed stepladder.  Make  sure that both sides of the stepladder are locked into place.  Ask someone to hold it for you, if possible.  Clearly mark and make sure that you can see:  Any grab bars or handrails.  First and last steps.  Where the edge of each step is.  Use tools that help you move around (mobility aids) if they are needed. These include:  Canes.  Walkers.  Scooters.  Crutches.  Turn on the lights when you go into a dark area. Replace any light bulbs as soon as they burn out.  Set up your furniture so you have a clear path. Avoid moving your furniture around.  If any of your floors are uneven, fix them.  If there are any pets around you, be aware of where they are.  Review your medicines with your doctor. Some medicines can make you feel dizzy. This can increase your chance of falling. Ask your doctor what other things that you can do to help prevent falls. This information is not intended to replace advice given to you by your health care provider. Make sure you discuss any questions you have with your health care provider. Document Released: 11/18/2008 Document Revised: 06/30/2015 Document Reviewed: 02/26/2014 Elsevier Interactive Patient Education  2017 [employer].

## 2020-03-25 DIAGNOSIS — L02214 Cutaneous abscess of groin: Secondary | ICD-10-CM | POA: Insufficient documentation

## 2020-03-25 DIAGNOSIS — B019 Varicella without complication: Secondary | ICD-10-CM | POA: Insufficient documentation

## 2020-03-25 DIAGNOSIS — R590 Localized enlarged lymph nodes: Secondary | ICD-10-CM | POA: Insufficient documentation

## 2020-03-25 DIAGNOSIS — K5790 Diverticulosis of intestine, part unspecified, without perforation or abscess without bleeding: Secondary | ICD-10-CM | POA: Insufficient documentation

## 2020-03-25 DIAGNOSIS — R7402 Elevation of levels of lactic acid dehydrogenase (LDH): Secondary | ICD-10-CM | POA: Insufficient documentation

## 2020-03-25 DIAGNOSIS — A449 Bartonellosis, unspecified: Secondary | ICD-10-CM | POA: Insufficient documentation

## 2020-03-25 DIAGNOSIS — E079 Disorder of thyroid, unspecified: Secondary | ICD-10-CM | POA: Insufficient documentation

## 2020-03-25 DIAGNOSIS — B159 Hepatitis A without hepatic coma: Secondary | ICD-10-CM | POA: Insufficient documentation

## 2020-03-25 DIAGNOSIS — Z9109 Other allergy status, other than to drugs and biological substances: Secondary | ICD-10-CM | POA: Insufficient documentation

## 2020-04-05 ENCOUNTER — Ambulatory Visit: Payer: Medicare HMO | Admitting: Cardiology

## 2020-04-05 ENCOUNTER — Other Ambulatory Visit: Payer: Self-pay

## 2020-04-05 ENCOUNTER — Encounter: Payer: Self-pay | Admitting: Cardiology

## 2020-04-05 VITALS — BP 112/80 | HR 82 | Ht 64.0 in | Wt 157.0 lb

## 2020-04-05 DIAGNOSIS — E538 Deficiency of other specified B group vitamins: Secondary | ICD-10-CM | POA: Diagnosis not present

## 2020-04-05 DIAGNOSIS — I10 Essential (primary) hypertension: Secondary | ICD-10-CM | POA: Diagnosis not present

## 2020-04-05 DIAGNOSIS — R531 Weakness: Secondary | ICD-10-CM

## 2020-04-05 DIAGNOSIS — R002 Palpitations: Secondary | ICD-10-CM | POA: Diagnosis not present

## 2020-04-05 DIAGNOSIS — E039 Hypothyroidism, unspecified: Secondary | ICD-10-CM

## 2020-04-05 NOTE — Patient Instructions (Signed)

## 2020-04-05 NOTE — Progress Notes (Signed)
Cardiology Office Note:    Date:  04/05/2020   ID:  [name], DOB 03-Jun-1935, MRN [id number]  PCP:  Ma Hillock, DO  Cardiologist:  Jenne Campus, MD    Referring MD: Ma Hillock, DO   Chief Complaint  Patient presents with  . Follow-up  I am doing fine  History of Present Illness:    Denise Fuller is a 85 y.o. female with past medical history significant for essential hypertension, dyslipidemia.  She was referred to [place] because of profound weakness fatigue.  We did quite intensive evaluation which include echocardiogram showed preserved left ventricle ejection fraction.  Finally it looks like the reason for her fatigue and tiredness with B12 deficiency.  Finally when she was recognized with this condition she started being treated appropriately and feeling much better.  She said she got plenty of energy.  She does have stationary treadmill at home and she used it for 20 minutes every single day look like she walks about 3 mph and she is doing very well.  Overall she is fine.  Denies have any chest pain tightness squeezing pressure burning chest.  Past Medical History:  Diagnosis Date  . Abnormal SPEP 07/16/2019  . B12 deficiency 07/16/2019  . Bartonella infection   . BCC (basal cell carcinoma of skin) 1997   nose  . Chickenpox   . Chronic bilateral low back pain without sciatica 06/23/2019  . Chronic venous insufficiency   . Diverticulosis   . Dyspnea on exertion 07/10/2019  . Elevated alkaline phosphatase level   . Elevated LDH   . Elevated serum immunoglobulin free light chains 07/21/2019  . Environmental allergies   . GAD (generalized anxiety disorder)   . Goals of care, counseling/discussion 08/28/2019  . Groin abscess    culture negative, doxy resolved abscess  . Hepatitis A   . Hyperlipidemia   . Hypertension   . Hypothyroidism   . Ingrown nail 04/10/2018  . Lightheadedness 12/03/2018  . Lymphadenopathy, cervical    chronic per pt. (left)  . Macular  degeneration   . Meniere disease   . MGUS (monoclonal gammopathy of unknown significance) 08/28/2019  . Other fatigue 12/03/2018  . Overweight (BMI 25.0-29.9) 04/10/2018  . Palpitations 12/03/2018  . Thyroid disease   . Weakness generalized 12/03/2018    Past Surgical History:  Procedure Laterality Date  . ABDOMINAL HYSTERECTOMY  1991  . APPENDECTOMY  1967  . BASAL CELL CARCINOMA EXCISION  1997   nose  . BLEPHAROPLASTY    . CATARACT EXTRACTION W/ INTRAOCULAR LENS IMPLANT Bilateral Oct and Nov 2018  . CHOLECYSTECTOMY  1967    Current Medications: Current Meds  Medication Sig  . aspirin EC 81 MG tablet Take 81 mg by mouth daily.  . Biotin 5000 MCG TABS Take by mouth.   . calcium citrate-vitamin D (CITRACAL+D) 315-200 MG-UNIT tablet Take 1 tablet by mouth 2 (two) times daily.   . cetirizine (ZYRTEC) 10 MG tablet Take 10 mg by mouth as needed for allergies.  . Cholecalciferol (VITAMIN D3) 1000 units CAPS Take by mouth. 2,000 units daily  . co-enzyme Q-10 30 MG capsule Take 30 mg by mouth 3 (three) times daily.   . Cyanocobalamin (B-12 COMPLIANCE INJECTION) 1000 MCG/ML KIT Inject as directed. 08/28/2019 Weekly  . DHA-EPA-Vitamin E (OMEGA-3 COMPLEX PO) Take 360 mg by mouth daily.  Marland Kitchen ELDERBERRY PO Take by mouth.  . Flaxseed, Linseed, (FLAX SEED OIL PO) Take 1,300 mg by mouth 2 (two) times daily.  Marland Kitchen  glucosamine-chondroitin 500-400 MG tablet Take 1 tablet by mouth 3 (three) times daily.   Marland Kitchen levothyroxine (SYNTHROID) 112 MCG tablet 1 tablet daily on an empty stomach 6 days a week and 1/2 tablet on Sundays  . lisinopril (ZESTRIL) 5 MG tablet Take 1 tablet (5 mg total) by mouth daily.  Vladimir Faster Glycol-Propyl Glycol (SYSTANE OP) Apply to eye.  . triamterene-hydrochlorothiazide (DYAZIDE) 37.5-25 MG capsule Take 1 each (1 capsule total) by mouth daily.     Allergies:   Doxycycline, Keflex [cephalexin], and Iron   Social History   Socioeconomic History  . Marital status: Married     Spouse name: [name]  . Number of children: 3  . Years of education: Not on file  . Highest education level: Not on file  Occupational History  . Occupation: retired  Tobacco Use  . Smoking status: Never Smoker  . Smokeless tobacco: Never Used  Vaping Use  . Vaping Use: Never used  Substance and Sexual Activity  . Alcohol use: No  . Drug use: No  . Sexual activity: Yes    Partners: Male    Comment: married  Other Topics Concern  . Not on file  Social History Narrative   Retired. Married to Uncertain. They have 3 children [name])   [place] from [place] 05/2014.    Has 2 older dogs.    Retired.    Drinks caffeine, takes herbal remedies and dialy vitamin.   Wears her seatbelt, smoke detector in the home   Wears dentures, no assistive devices for walking - independent.    Feels safe in her relationships.    Social Determinants of Health   Financial Resource Strain: Low Risk   . Difficulty of Paying Living Expenses: Not hard at all  Food Insecurity: No Food Insecurity  . Worried About Charity fundraiser in the Last Year: Never true  . Ran Out of Food in the Last Year: Never true  Transportation Needs: No Transportation Needs  . Lack of Transportation (Medical): No  . Lack of Transportation (Non-Medical): No  Physical Activity: Insufficiently Active  . Days of Exercise per Week: 6 days  . Minutes of Exercise per Session: 20 min  Stress: No Stress Concern Present  . Feeling of Stress : Not at all  Social Connections: Moderately Isolated  . Frequency of Communication with Friends and Family: More than three times a week  . Frequency of Social Gatherings with Friends and Family: More than three times a week  . Attends Religious Services: Never  . Active Member of Clubs or Organizations: No  . Attends Archivist Meetings: Never  . Marital Status: Married     Family History: The patient's family history includes Aneurysm (age of onset: 42) in her father;  Arthritis in her father and mother; Atrial fibrillation in her son; Heart disease in her mother; Stroke in her daughter; Thyroid disease in her daughter. ROS:   Please see the history of present illness.    All 14 point review of systems negative except as described per history of present illness  EKGs/Labs/Other Studies Reviewed:      Recent Labs: 08/28/2019: ALT 16; BUN 18; Creatinine 1.07; Hemoglobin 12.8; Platelet Count 292; Potassium 3.5; Sodium 141 10/06/2019: TSH 0.73  Recent Lipid Panel    Component Value Date/Time   CHOL 197 10/15/2018 0959   TRIG 108.0 10/15/2018 0959   HDL 64.60 10/15/2018 0959   CHOLHDL 3 10/15/2018 0959   VLDL 21.6 10/15/2018  1216   LDLCALC 111 (H) 10/15/2018 0959    Physical Exam:    VS:  BP 112/80 (BP Location: Right Arm, Patient Position: Sitting)   Pulse 82   Ht $R'5\' 4"'Yg$  (1.626 m)   Wt 157 lb (71.2 kg)   SpO2 97%   BMI 26.95 kg/m     Wt Readings from Last 3 Encounters:  04/05/20 157 lb (71.2 kg)  03/23/20 156 lb 6.4 oz (70.9 kg)  10/06/19 162 lb 9.6 oz (73.8 kg)     GEN:  Well nourished, well developed in no acute distress HEENT: Normal NECK: No JVD; No carotid bruits LYMPHATICS: No lymphadenopathy CARDIAC: RRR, no murmurs, no rubs, no gallops RESPIRATORY:  Clear to auscultation without rales, wheezing or rhonchi  ABDOMEN: Soft, non-tender, non-distended MUSCULOSKELETAL:  No edema; No deformity  SKIN: Warm and dry LOWER EXTREMITIES: no swelling NEUROLOGIC:  Alert and oriented x 3 PSYCHIATRIC:  Normal affect   ASSESSMENT:    1. Primary hypertension   2. Hypothyroidism, unspecified type   3. Palpitations   4. Weakness generalized   5. B12 deficiency    PLAN:    In order of problems listed above:  1. Essential hypertension, blood pressure seems to be well controlled we will continue present management. 2. Hypothyroidism to be followed by internal medicine team.  Her TSH from August is 0.73 this is from K PN reviewing. 3. B12  deficiency followed by 10 medicine team she is doing much better from that point review overall she is feeling much stronger and doing well. 4. Palpitations: Denies having any. 5. Dyslipidemia her LDL is 111 however HDL is high 64.  We will recheck fasting lipid profile in the future to decide about potential treatment.   Medication Adjustments/Labs and Tests Ordered: Current medicines are reviewed at length with the patient today.  Concerns regarding medicines are outlined above.  No orders of the defined types were placed in this encounter.  Medication changes: No orders of the defined types were placed in this encounter.   Signed, Park Liter, MD, [facility] 04/05/2020 10:43 AM    [place]

## 2020-04-25 ENCOUNTER — Other Ambulatory Visit: Payer: Self-pay | Admitting: Family Medicine

## 2020-04-25 DIAGNOSIS — I10 Essential (primary) hypertension: Secondary | ICD-10-CM

## 2020-05-09 ENCOUNTER — Other Ambulatory Visit: Payer: Self-pay

## 2020-05-09 DIAGNOSIS — I10 Essential (primary) hypertension: Secondary | ICD-10-CM

## 2020-05-09 MED ORDER — TRIAMTERENE-HCTZ 37.5-25 MG PO CAPS: 1.0000 | ORAL_CAPSULE | Freq: Every day | ORAL | 0 refills | Status: DC

## 2020-05-09 MED ORDER — LISINOPRIL 5 MG PO TABS: 5.0000 mg | ORAL_TABLET | Freq: Every day | ORAL | 0 refills | Status: DC

## 2020-05-11 ENCOUNTER — Ambulatory Visit (INDEPENDENT_AMBULATORY_CARE_PROVIDER_SITE_OTHER): Payer: Medicare HMO

## 2020-05-11 ENCOUNTER — Other Ambulatory Visit: Payer: Self-pay

## 2020-05-11 DIAGNOSIS — E538 Deficiency of other specified B group vitamins: Secondary | ICD-10-CM

## 2020-05-11 MED ORDER — CYANOCOBALAMIN 1000 MCG/ML IJ SOLN
1000.0000 ug | Freq: Once | INTRAMUSCULAR | Status: AC
  Administered 2020-05-11: 1000 ug via INTRAMUSCULAR

## 2020-05-11 NOTE — Progress Notes (Signed)
Denise Stansell Smithis a 85 y.o.femalepresents to the office today for Vitamin B12injections, per physician's orders. Original order:07/13/19 Vitamin b12 1014mcgwas administered left deltoidtoday. Patient tolerated injection.   [place]

## 2020-05-17 DIAGNOSIS — M79632 Pain in left forearm: Secondary | ICD-10-CM | POA: Diagnosis not present

## 2020-05-17 DIAGNOSIS — S52252B Displaced comminuted fracture of shaft of ulna, left arm, initial encounter for open fracture type I or II: Secondary | ICD-10-CM | POA: Diagnosis not present

## 2020-05-17 DIAGNOSIS — S52609B Unspecified fracture of lower end of unspecified ulna, initial encounter for open fracture type I or II: Secondary | ICD-10-CM | POA: Diagnosis not present

## 2020-05-17 DIAGNOSIS — S52252A Displaced comminuted fracture of shaft of ulna, left arm, initial encounter for closed fracture: Secondary | ICD-10-CM | POA: Diagnosis not present

## 2020-05-17 DIAGNOSIS — I10 Essential (primary) hypertension: Secondary | ICD-10-CM | POA: Diagnosis not present

## 2020-05-17 DIAGNOSIS — G8918 Other acute postprocedural pain: Secondary | ICD-10-CM | POA: Diagnosis not present

## 2020-05-18 HISTORY — PX: ORIF WRIST FRACTURE: SHX2133

## 2020-05-26 DIAGNOSIS — S52602A Unspecified fracture of lower end of left ulna, initial encounter for closed fracture: Secondary | ICD-10-CM | POA: Diagnosis not present

## 2020-05-31 DIAGNOSIS — Z4889 Encounter for other specified surgical aftercare: Secondary | ICD-10-CM | POA: Diagnosis not present

## 2020-05-31 DIAGNOSIS — S52202D Unspecified fracture of shaft of left ulna, subsequent encounter for closed fracture with routine healing: Secondary | ICD-10-CM | POA: Diagnosis not present

## 2020-06-02 DIAGNOSIS — Z4789 Encounter for other orthopedic aftercare: Secondary | ICD-10-CM | POA: Diagnosis not present

## 2020-06-02 DIAGNOSIS — S52202D Unspecified fracture of shaft of left ulna, subsequent encounter for closed fracture with routine healing: Secondary | ICD-10-CM | POA: Diagnosis not present

## 2020-06-07 DIAGNOSIS — S52202D Unspecified fracture of shaft of left ulna, subsequent encounter for closed fracture with routine healing: Secondary | ICD-10-CM | POA: Diagnosis not present

## 2020-06-07 DIAGNOSIS — Z4889 Encounter for other specified surgical aftercare: Secondary | ICD-10-CM | POA: Diagnosis not present

## 2020-06-09 DIAGNOSIS — S52602S Unspecified fracture of lower end of left ulna, sequela: Secondary | ICD-10-CM | POA: Diagnosis not present

## 2020-06-10 DIAGNOSIS — S52202D Unspecified fracture of shaft of left ulna, subsequent encounter for closed fracture with routine healing: Secondary | ICD-10-CM | POA: Diagnosis not present

## 2020-06-10 DIAGNOSIS — Z4889 Encounter for other specified surgical aftercare: Secondary | ICD-10-CM | POA: Diagnosis not present

## 2020-06-15 DIAGNOSIS — Z4889 Encounter for other specified surgical aftercare: Secondary | ICD-10-CM | POA: Diagnosis not present

## 2020-06-15 DIAGNOSIS — S52202D Unspecified fracture of shaft of left ulna, subsequent encounter for closed fracture with routine healing: Secondary | ICD-10-CM | POA: Diagnosis not present

## 2020-06-16 ENCOUNTER — Ambulatory Visit (INDEPENDENT_AMBULATORY_CARE_PROVIDER_SITE_OTHER): Payer: Medicare HMO

## 2020-06-16 ENCOUNTER — Other Ambulatory Visit: Payer: Self-pay

## 2020-06-16 DIAGNOSIS — E538 Deficiency of other specified B group vitamins: Secondary | ICD-10-CM

## 2020-06-16 MED ORDER — CYANOCOBALAMIN 1000 MCG/ML IJ SOLN
1000.0000 ug | Freq: Once | INTRAMUSCULAR | Status: AC
  Administered 2020-06-16: 1000 ug via INTRAMUSCULAR

## 2020-06-16 NOTE — Progress Notes (Signed)
Denise Deshotels Smithis a 85 y.o.femalepresents to the office today for Vitamin B12injections, per physician's orders. Original order:07/13/19 Vitamin b12 1070mcgwas administered left deltoidtoday. Patient tolerated injection. Next injection scheduled for 07/20/20.

## 2020-06-17 DIAGNOSIS — S52202D Unspecified fracture of shaft of left ulna, subsequent encounter for closed fracture with routine healing: Secondary | ICD-10-CM | POA: Diagnosis not present

## 2020-06-17 DIAGNOSIS — Z4889 Encounter for other specified surgical aftercare: Secondary | ICD-10-CM | POA: Diagnosis not present

## 2020-06-20 DIAGNOSIS — Z4889 Encounter for other specified surgical aftercare: Secondary | ICD-10-CM | POA: Diagnosis not present

## 2020-06-20 DIAGNOSIS — S52202D Unspecified fracture of shaft of left ulna, subsequent encounter for closed fracture with routine healing: Secondary | ICD-10-CM | POA: Diagnosis not present

## 2020-06-24 DIAGNOSIS — S52202D Unspecified fracture of shaft of left ulna, subsequent encounter for closed fracture with routine healing: Secondary | ICD-10-CM | POA: Diagnosis not present

## 2020-06-24 DIAGNOSIS — Z4889 Encounter for other specified surgical aftercare: Secondary | ICD-10-CM | POA: Diagnosis not present

## 2020-06-29 DIAGNOSIS — S52202D Unspecified fracture of shaft of left ulna, subsequent encounter for closed fracture with routine healing: Secondary | ICD-10-CM | POA: Diagnosis not present

## 2020-06-29 DIAGNOSIS — Z4889 Encounter for other specified surgical aftercare: Secondary | ICD-10-CM | POA: Diagnosis not present

## 2020-07-01 DIAGNOSIS — Z4889 Encounter for other specified surgical aftercare: Secondary | ICD-10-CM | POA: Diagnosis not present

## 2020-07-01 DIAGNOSIS — S52202D Unspecified fracture of shaft of left ulna, subsequent encounter for closed fracture with routine healing: Secondary | ICD-10-CM | POA: Diagnosis not present

## 2020-07-05 DIAGNOSIS — Z4889 Encounter for other specified surgical aftercare: Secondary | ICD-10-CM | POA: Diagnosis not present

## 2020-07-05 DIAGNOSIS — S52202D Unspecified fracture of shaft of left ulna, subsequent encounter for closed fracture with routine healing: Secondary | ICD-10-CM | POA: Diagnosis not present

## 2020-07-07 DIAGNOSIS — S52602S Unspecified fracture of lower end of left ulna, sequela: Secondary | ICD-10-CM | POA: Diagnosis not present

## 2020-07-08 DIAGNOSIS — S52202D Unspecified fracture of shaft of left ulna, subsequent encounter for closed fracture with routine healing: Secondary | ICD-10-CM | POA: Diagnosis not present

## 2020-07-08 DIAGNOSIS — Z4889 Encounter for other specified surgical aftercare: Secondary | ICD-10-CM | POA: Diagnosis not present

## 2020-07-19 DIAGNOSIS — Z4889 Encounter for other specified surgical aftercare: Secondary | ICD-10-CM | POA: Diagnosis not present

## 2020-07-19 DIAGNOSIS — S52202D Unspecified fracture of shaft of left ulna, subsequent encounter for closed fracture with routine healing: Secondary | ICD-10-CM | POA: Diagnosis not present

## 2020-07-20 ENCOUNTER — Ambulatory Visit (INDEPENDENT_AMBULATORY_CARE_PROVIDER_SITE_OTHER): Payer: Medicare HMO

## 2020-07-20 ENCOUNTER — Other Ambulatory Visit: Payer: Self-pay

## 2020-07-20 DIAGNOSIS — E538 Deficiency of other specified B group vitamins: Secondary | ICD-10-CM | POA: Diagnosis not present

## 2020-07-20 MED ORDER — CYANOCOBALAMIN 1000 MCG/ML IJ SOLN
1000.0000 ug | Freq: Once | INTRAMUSCULAR | Status: AC
  Administered 2020-07-20: 1000 ug via INTRAMUSCULAR

## 2020-07-22 ENCOUNTER — Other Ambulatory Visit: Payer: Self-pay | Admitting: Family Medicine

## 2020-07-22 DIAGNOSIS — I10 Essential (primary) hypertension: Secondary | ICD-10-CM

## 2020-07-22 DIAGNOSIS — S52202D Unspecified fracture of shaft of left ulna, subsequent encounter for closed fracture with routine healing: Secondary | ICD-10-CM | POA: Diagnosis not present

## 2020-07-22 DIAGNOSIS — Z4889 Encounter for other specified surgical aftercare: Secondary | ICD-10-CM | POA: Diagnosis not present

## 2020-07-22 NOTE — Progress Notes (Signed)
Denise Fuller is a 85 y.o. female presents to the office today for Vitamin B12 injections, per physician's orders. Original order: 07/13/19 Vitamin b12 1029mcg was administered left deltoid today. Patient tolerated injection. Next injection scheduled for 08/17/20.

## 2020-07-26 DIAGNOSIS — Z4889 Encounter for other specified surgical aftercare: Secondary | ICD-10-CM | POA: Diagnosis not present

## 2020-07-26 DIAGNOSIS — S52202D Unspecified fracture of shaft of left ulna, subsequent encounter for closed fracture with routine healing: Secondary | ICD-10-CM | POA: Diagnosis not present

## 2020-07-28 DIAGNOSIS — Z4889 Encounter for other specified surgical aftercare: Secondary | ICD-10-CM | POA: Diagnosis not present

## 2020-07-28 DIAGNOSIS — S52202D Unspecified fracture of shaft of left ulna, subsequent encounter for closed fracture with routine healing: Secondary | ICD-10-CM | POA: Diagnosis not present

## 2020-08-02 DIAGNOSIS — Z4889 Encounter for other specified surgical aftercare: Secondary | ICD-10-CM | POA: Diagnosis not present

## 2020-08-02 DIAGNOSIS — S52202D Unspecified fracture of shaft of left ulna, subsequent encounter for closed fracture with routine healing: Secondary | ICD-10-CM | POA: Diagnosis not present

## 2020-08-12 NOTE — Progress Notes (Signed)
Denise Fuller is a 85 y.o. female presents to the office today for Vitamin B12 injections, per physician's orders. Original order: 07/13/19 Vitamin b12 1039mcg was administered left deltoid today. Patient tolerated injection. Next injection scheduled for 08/17/20.

## 2020-08-17 ENCOUNTER — Ambulatory Visit (INDEPENDENT_AMBULATORY_CARE_PROVIDER_SITE_OTHER): Payer: Medicare HMO

## 2020-08-17 ENCOUNTER — Other Ambulatory Visit: Payer: Self-pay

## 2020-08-17 DIAGNOSIS — E538 Deficiency of other specified B group vitamins: Secondary | ICD-10-CM

## 2020-08-17 MED ORDER — CYANOCOBALAMIN 1000 MCG/ML IJ SOLN
1000.0000 ug | Freq: Once | INTRAMUSCULAR | Status: AC
  Administered 2020-08-17: 1000 ug via INTRAMUSCULAR

## 2020-08-17 NOTE — Progress Notes (Signed)
Denise Fuller is a 85 y.o. female presents to the office today for Vitamin B12 injections, per physician's orders. Original order: 07/13/19 Vitamin b12 1057mcg was administered left deltoid today. Patient tolerated injection. Next injection scheduled for 09/21/20.

## 2020-08-31 ENCOUNTER — Other Ambulatory Visit: Payer: Self-pay | Admitting: Family Medicine

## 2020-08-31 DIAGNOSIS — E039 Hypothyroidism, unspecified: Secondary | ICD-10-CM

## 2020-09-06 DIAGNOSIS — S52602S Unspecified fracture of lower end of left ulna, sequela: Secondary | ICD-10-CM | POA: Diagnosis not present

## 2020-09-06 DIAGNOSIS — S52602A Unspecified fracture of lower end of left ulna, initial encounter for closed fracture: Secondary | ICD-10-CM | POA: Diagnosis not present

## 2020-09-16 ENCOUNTER — Other Ambulatory Visit: Payer: Self-pay

## 2020-09-16 ENCOUNTER — Ambulatory Visit (INDEPENDENT_AMBULATORY_CARE_PROVIDER_SITE_OTHER): Payer: Medicare HMO

## 2020-09-16 DIAGNOSIS — E538 Deficiency of other specified B group vitamins: Secondary | ICD-10-CM | POA: Diagnosis not present

## 2020-09-16 MED ORDER — CYANOCOBALAMIN 1000 MCG/ML IJ SOLN
1000.0000 ug | Freq: Once | INTRAMUSCULAR | Status: AC
Start: 2020-09-16 — End: 2020-09-16
  Administered 2020-09-16: 1000 ug via INTRAMUSCULAR

## 2020-09-16 NOTE — Progress Notes (Signed)
Per the orders of Dr. [name] pt is here for B12 injection. Pt received injection in left deltoid, pt tolerated injection well. Pt will return in one month for next injection.   

## 2020-09-21 ENCOUNTER — Ambulatory Visit: Payer: Medicare HMO

## 2020-09-29 ENCOUNTER — Other Ambulatory Visit: Payer: Self-pay | Admitting: Family Medicine

## 2020-09-29 DIAGNOSIS — E039 Hypothyroidism, unspecified: Secondary | ICD-10-CM

## 2020-10-06 ENCOUNTER — Telehealth: Payer: Self-pay | Admitting: Family Medicine

## 2020-10-06 DIAGNOSIS — I10 Essential (primary) hypertension: Secondary | ICD-10-CM

## 2020-10-06 MED ORDER — TRIAMTERENE-HCTZ 37.5-25 MG PO CAPS: 1.0000 | ORAL_CAPSULE | Freq: Every day | ORAL | 0 refills | Status: DC

## 2020-10-06 MED ORDER — LISINOPRIL 5 MG PO TABS: 5.0000 mg | ORAL_TABLET | Freq: Every day | ORAL | 0 refills | Status: DC

## 2020-10-06 NOTE — Telephone Encounter (Signed)
Rx sent. Must ep upcoming appt

## 2020-10-06 NOTE — Telephone Encounter (Signed)
Patient requesting refills of Lisinopril (has 11 days left) and triamterene (has 2 weeks left). Scheduled followup appt 10/31/20. Please send to same [facility] mail order.

## 2020-10-07 ENCOUNTER — Other Ambulatory Visit: Payer: Self-pay | Admitting: Family Medicine

## 2020-10-07 ENCOUNTER — Other Ambulatory Visit: Payer: Self-pay

## 2020-10-07 DIAGNOSIS — E039 Hypothyroidism, unspecified: Secondary | ICD-10-CM

## 2020-10-07 MED ORDER — LEVOTHYROXINE SODIUM 112 MCG PO TABS: ORAL_TABLET | ORAL | 0 refills | Status: DC

## 2020-10-18 ENCOUNTER — Other Ambulatory Visit: Payer: Self-pay

## 2020-10-18 DIAGNOSIS — I10 Essential (primary) hypertension: Secondary | ICD-10-CM

## 2020-10-18 DIAGNOSIS — E039 Hypothyroidism, unspecified: Secondary | ICD-10-CM

## 2020-10-31 ENCOUNTER — Other Ambulatory Visit: Payer: Self-pay | Admitting: Family Medicine

## 2020-10-31 ENCOUNTER — Ambulatory Visit (INDEPENDENT_AMBULATORY_CARE_PROVIDER_SITE_OTHER): Payer: Medicare HMO | Admitting: Family Medicine

## 2020-10-31 ENCOUNTER — Other Ambulatory Visit: Payer: Self-pay

## 2020-10-31 ENCOUNTER — Encounter: Payer: Self-pay | Admitting: Family Medicine

## 2020-10-31 VITALS — BP 103/69 | HR 84 | Temp 98.5°F | Ht 64.0 in | Wt 150.0 lb

## 2020-10-31 DIAGNOSIS — E538 Deficiency of other specified B group vitamins: Secondary | ICD-10-CM

## 2020-10-31 DIAGNOSIS — D472 Monoclonal gammopathy: Secondary | ICD-10-CM

## 2020-10-31 DIAGNOSIS — I10 Essential (primary) hypertension: Secondary | ICD-10-CM | POA: Diagnosis not present

## 2020-10-31 DIAGNOSIS — N1831 Chronic kidney disease, stage 3a: Secondary | ICD-10-CM | POA: Diagnosis not present

## 2020-10-31 DIAGNOSIS — Z23 Encounter for immunization: Secondary | ICD-10-CM | POA: Diagnosis not present

## 2020-10-31 DIAGNOSIS — R748 Abnormal levels of other serum enzymes: Secondary | ICD-10-CM

## 2020-10-31 DIAGNOSIS — R768 Other specified abnormal immunological findings in serum: Secondary | ICD-10-CM

## 2020-10-31 DIAGNOSIS — E782 Mixed hyperlipidemia: Secondary | ICD-10-CM | POA: Diagnosis not present

## 2020-10-31 DIAGNOSIS — E039 Hypothyroidism, unspecified: Secondary | ICD-10-CM

## 2020-10-31 LAB — CBC WITH DIFFERENTIAL/PLATELET
Basophils Absolute: 0 10*3/uL (ref 0.0–0.1)
Basophils Relative: 0.7 % (ref 0.0–3.0)
Eosinophils Absolute: 0.2 10*3/uL (ref 0.0–0.7)
Eosinophils Relative: 3.2 % (ref 0.0–5.0)
HCT: 38.1 % (ref 36.0–46.0)
Hemoglobin: 12.5 g/dL (ref 12.0–15.0)
Lymphocytes Relative: 31.1 % (ref 12.0–46.0)
Lymphs Abs: 2.3 10*3/uL (ref 0.7–4.0)
MCHC: 32.9 g/dL (ref 30.0–36.0)
MCV: 89.1 fl (ref 78.0–100.0)
Monocytes Absolute: 0.8 10*3/uL (ref 0.1–1.0)
Monocytes Relative: 10.7 % (ref 3.0–12.0)
Neutro Abs: 4 10*3/uL (ref 1.4–7.7)
Neutrophils Relative %: 54.3 % (ref 43.0–77.0)
Platelets: 297 10*3/uL (ref 150.0–400.0)
RBC: 4.28 Mil/uL (ref 3.87–5.11)
RDW: 14.5 % (ref 11.5–15.5)
WBC: 7.4 10*3/uL (ref 4.0–10.5)

## 2020-10-31 LAB — COMPREHENSIVE METABOLIC PANEL
ALT: 14 U/L (ref 0–35)
AST: 17 U/L (ref 0–37)
Albumin: 3.9 g/dL (ref 3.5–5.2)
Alkaline Phosphatase: 152 U/L — ABNORMAL HIGH (ref 39–117)
BUN: 12 mg/dL (ref 6–23)
CO2: 29 mEq/L (ref 19–32)
Calcium: 9.3 mg/dL (ref 8.4–10.5)
Chloride: 101 mEq/L (ref 96–112)
Creatinine, Ser: 0.83 mg/dL (ref 0.40–1.20)
GFR: 65.43 mL/min (ref >60.00)
Glucose, Bld: 85 mg/dL (ref 70–99)
Potassium: 3.7 mEq/L (ref 3.5–5.1)
Sodium: 138 mEq/L (ref 135–145)
Total Bilirubin: 0.3 mg/dL (ref 0.2–1.2)
Total Protein: 6.5 g/dL (ref 6.0–8.3)

## 2020-10-31 LAB — TSH: TSH: 0.43 u[IU]/mL (ref 0.35–5.50)

## 2020-10-31 LAB — LDL CHOLESTEROL, DIRECT: Direct LDL: 134 mg/dL

## 2020-10-31 LAB — VITAMIN B12: Vitamin B-12: 565 pg/mL (ref 211–911)

## 2020-10-31 MED ORDER — CYANOCOBALAMIN 1000 MCG/ML IJ SOLN
1000.0000 ug | Freq: Once | INTRAMUSCULAR | Status: AC
  Administered 2020-10-31: 1000 ug via INTRAMUSCULAR

## 2020-10-31 MED ORDER — LISINOPRIL 5 MG PO TABS: 5.0000 mg | ORAL_TABLET | Freq: Every day | ORAL | 1 refills | Status: DC

## 2020-10-31 MED ORDER — LEVOTHYROXINE SODIUM 112 MCG PO TABS: ORAL_TABLET | ORAL | 3 refills | Status: DC

## 2020-10-31 MED ORDER — TRIAMTERENE-HCTZ 37.5-25 MG PO CAPS: ORAL_CAPSULE | ORAL | 0 refills | Status: DC

## 2020-10-31 NOTE — Patient Instructions (Addendum)
  Great to see you today.  I have refilled the medication(s) we provide.   If labs were collected, we will inform you of lab results once received either by echart message or telephone call.   - echart message- for normal results that have been seen by the patient already.   - telephone call: abnormal results or if patient has not viewed results in their echart.  Next appt in 5.5 months. For chronic conditions.   Stop water pill continue lisinopril.

## 2020-10-31 NOTE — Addendum Note (Signed)
Addended by: Octaviano Glow on: 10/31/2020 03:49 PM   Modules accepted: Orders

## 2020-10-31 NOTE — Progress Notes (Signed)
Denise Fuller , 12/03/1935, 85 y.o., female MRN: [id number] Patient Care Team    Relationship Specialty Notifications Start End  Denise Leatherwood, DO PCP - General Family Medicine  01/10/16   Denise Fuller, OD Referring Physician   01/10/16    Comment: ophth- macular degeneration  Denise Fuller  Dentistry  04/26/17     Chief Complaint  Patient presents with   Hypertension    CMC; pt is not fasting     Subjective: Denise Fuller is a 85 y.o. female present for follow-up on her CMC Hypothyroidism, unspecified type Pt reports compliance with levo 112 mcg qd.  Labs due today.  Essential hypertension/HLD Pt reports compliance with lisinopril and dyazide.. Blood pressures ranges at home not normally checked. . Patient denies chest pain, shortness of breath or lower extremity edema. Pt takesdaily baby ASA.   B12 deficiency Once monthly B12 injections have been continued and patient reports they have helped a great deal. Due for both lab and b12 today.   MGUS (monoclonal gammopathy of unknown significance)/Elevated serum immunoglobulin free light chains She is also been established with oncology secondary to her mildly abnormal SPEP/UPEP.  They recommend continuing SPEP every 6 months and if M spike rises to refer back to them. She has been lost to follow up and has not had spep in 12 months.  Prior note Patient presents today to follow-up on her lab results and discuss her fatigue.  Since her last appointment she has been seen by the cardiologist who plans to perform an echo.  Today we discussed her laboratory abnormalities with a low TSH, mildly low albumin with positive light chains on SPEP, B12 deficiency, elevated alk phos, x-ray results of old T12 compression fracture and degenerative disc disease, normal: Vitamin D, iron, CBC, PTH calcium, GGT and urine culture.  Depression screen [facility] 2/9 03/23/2020 10/15/2018 04/10/2018 04/26/2017 04/23/2016  Decreased Interest 0 0 0 0 0  Down, Depressed,  Hopeless 0 0 0 0 0  PHQ - 2 Score 0 0 0 0 0  Altered sleeping - - 0 - -  Tired, decreased energy - - 0 - -  Change in appetite - - 0 - -  Feeling bad or failure about yourself  - - 0 - -  Trouble concentrating - - 0 - -  Moving slowly or fidgety/restless - - 0 - -  Suicidal thoughts - - 0 - -  PHQ-9 Score - - 0 - -  Difficult doing work/chores - - Not difficult at all - -    Allergies  Allergen Reactions   Doxycycline     GI upset   Keflex [Cephalexin] Hives and Itching   Iron Rash   Social History   Social History Narrative   Retired. Married to [place]. They have 3 children [place])   Moved from [place] 05/2014.    Has 2 older dogs.    Retired.    Drinks caffeine, takes herbal remedies and dialy vitamin.   Wears her seatbelt, smoke detector in the home   Wears dentures, no assistive devices for walking - independent.    Feels safe in her relationships.    Past Medical History:  Diagnosis Date   Abnormal SPEP 07/16/2019   B12 deficiency 07/16/2019   Bartonella infection    BCC (basal cell carcinoma of skin) 1997   nose   Chickenpox    Chronic bilateral low back pain without sciatica 06/23/2019   Chronic venous  insufficiency    Diverticulosis    Dyspnea on exertion 07/10/2019   Elevated alkaline phosphatase level    Elevated LDH    Elevated serum immunoglobulin free light chains 07/21/2019   Environmental allergies    GAD (generalized anxiety disorder)    Goals of care, counseling/discussion 08/28/2019   Groin abscess    culture negative, doxy resolved abscess   Hepatitis A    Hyperlipidemia    Hypertension    Hypothyroidism    Ingrown nail 04/10/2018   Lightheadedness 12/03/2018   Lymphadenopathy, cervical    chronic per pt. (left)   Macular degeneration    Meniere disease    MGUS (monoclonal gammopathy of unknown significance) 08/28/2019   Other fatigue 12/03/2018   Overweight (BMI 25.0-29.9) 04/10/2018   Palpitations 12/03/2018   Thyroid disease     Weakness generalized 12/03/2018   Past Surgical History:  Procedure Laterality Date   ABDOMINAL HYSTERECTOMY  1991   APPENDECTOMY  1967   BASAL CELL CARCINOMA EXCISION  1997   nose   BLEPHAROPLASTY     CATARACT EXTRACTION W/ INTRAOCULAR LENS IMPLANT Bilateral Oct and Nov 2018   CHOLECYSTECTOMY  1967   ORIF WRIST FRACTURE Left 05/18/2020   Family History  Problem Relation Age of Onset   Arthritis Mother    Heart disease Mother    Arthritis Father    Aneurysm Father 70       brain   Thyroid disease Daughter    Stroke Daughter        2020   Atrial fibrillation Son        2020    All past medical history, surgical history, allergies, family history, immunizations andmedications were updated in the EMR today and reviewed under the history and medication portions of their EMR.     ROS: Negative, with the exception of above mentioned in HPI   Objective:  BP 103/69   Pulse 84   Temp 98.5 F (36.9 C) (Oral)   Ht $R'5\' 4"'iS$  (1.626 m)   Wt 150 lb (68 kg)   SpO2 98%   BMI 25.75 kg/m  Body mass index is 25.75 kg/m. Gen: Afebrile. No acute distress. Nontoxic. Very pleasant female.  HENT: AT. [place]. No cough.  Eyes:Pupils Equal Round Reactive to light, Extraocular movements intact,  Conjunctiva without redness, discharge or icterus. Neck/lymp/endocrine: Supple,no lymphadenopathy, no thyromegaly CV: RRR no murmur, no edema, +2/4 P posterior tibialis pulses Chest: CTAB, no wheeze or crackles Neuro: Normal gait. PERLA. EOMi. Alert. Oriented x3  Psych: Normal affect, dress and demeanor. Normal speech. Normal thought content and judgment..   No results found. No results found. No results found for this or any previous visit (from the past 24 hour(s)).   Assessment/Plan: Denise Fuller is a 85 y.o. female present for OV for  Hypothyroidism, unspecified type Continue levo 112 mcg> lab collected today and med will be refilled in appropriate dose.   B12 deficiency - B12 levels  collected today   -  patient may continue B12 injections monthly per nurse visit indefinitely. - b12 IM administered today  Need for influenza vaccination - Flu Vaccine QUAD High Dose(Fluad)  HTN/Mixed hyperlipidemia Stable.  Dc dyazide and use only if swelling is appreciated.  Continue lisinopril 5 mg qd.  - Comprehensive metabolic panel - Direct LDL  Elevated serum immunoglobulin free light chains/MGUS (monoclonal gammopathy of unknown significance)/ckd3a Monitor BMP and spep q 6 mos.  If m-spike rises return to onc.  - Protein,Total and Electrophor  w/IFE-([facility]) - cmp collected tpday  Follow-up 5.5 months, sooner if needed.  Reviewed expectations re: course of current medical issues. Discussed self-management of symptoms. Outlined signs and symptoms indicating need for more acute intervention. Patient verbalized understanding and all questions were answered. Patient received an After-Visit Summary.   Orders Placed This Encounter  Procedures   Flu Vaccine QUAD High Dose(Fluad)   CBC with Differential/Platelet   Vitamin B12   TSH   Comprehensive metabolic panel   Direct LDL   Protein,Total and Electrophor w/IFE-([facility])   Meds ordered this encounter  Medications   triamterene-hydrochlorothiazide (DYAZIDE) 37.5-25 MG capsule    Sig: Take 1/2 tab as need for fluid/swelling only    Dispense:  30 capsule    Refill:  0   lisinopril (ZESTRIL) 5 MG tablet    Sig: Take 1 tablet (5 mg total) by mouth daily.    Dispense:  90 tablet    Refill:  1    Referral Orders  No referral(s) requested today     Note is dictated utilizing voice recognition software. Although note has been proof read prior to signing, occasional typographical errors still can be missed. If any questions arise, please do not hesitate to call for verification.   electronically signed by:  Howard Pouch, DO  [place]

## 2020-11-03 DIAGNOSIS — H353132 Nonexudative age-related macular degeneration, bilateral, intermediate dry stage: Secondary | ICD-10-CM | POA: Diagnosis not present

## 2020-11-03 LAB — PROTEIN,TOTAL AND ELECTROPHOR W/IFE
Albumin ELP: 3.8 g/dL (ref 3.8–4.8)
Alpha 1: 0.3 g/dL (ref 0.2–0.3)
Alpha 2: 0.8 g/dL (ref 0.5–0.9)
Beta 2: 0.3 g/dL (ref 0.2–0.5)
Beta Globulin: 0.4 g/dL (ref 0.4–0.6)
Gamma Globulin: 0.9 g/dL (ref 0.8–1.7)
Immunofix Electr Int: NOT DETECTED
Total Protein: 6.5 g/dL (ref 6.1–8.1)

## 2020-11-04 ENCOUNTER — Other Ambulatory Visit: Payer: Self-pay | Admitting: Family Medicine

## 2020-11-04 DIAGNOSIS — I10 Essential (primary) hypertension: Secondary | ICD-10-CM

## 2020-11-07 ENCOUNTER — Other Ambulatory Visit: Payer: Self-pay

## 2020-11-07 DIAGNOSIS — I10 Essential (primary) hypertension: Secondary | ICD-10-CM

## 2020-11-11 ENCOUNTER — Telehealth: Payer: Self-pay

## 2020-11-11 DIAGNOSIS — I10 Essential (primary) hypertension: Secondary | ICD-10-CM

## 2020-11-11 NOTE — Telephone Encounter (Signed)
Pt states that she was taken off of her diuretic medication and is now feeling very bloated. She states that she has gained 6 lbs. When asked if it has been within the last 3 days pt stated "on 09/26 weight was 150 and today 10/7 she now weights 156." She is concerned with the weight gain and the fact that she feels bloated.  Advised pt that PCP is out of the office until 10/12. Offered to schedule with different provider. Pt declined stating she will discuss with PCP.  Please advise of any changes. No other complaints or symptoms.

## 2020-11-11 NOTE — Telephone Encounter (Signed)
Patient feels as if she is retaining fluid again.  She has gained 6 lbs.  Patient doesn't like feeling this way.  Please advise.  [phone]

## 2020-11-16 MED ORDER — TRIAMTERENE-HCTZ 37.5-25 MG PO CAPS: ORAL_CAPSULE | ORAL | 3 refills | Status: DC

## 2020-11-16 NOTE — Telephone Encounter (Signed)
Pt was encouraged to take 1/2 tb of  dyazide if she felt she was having retention (PRN). Therefore, I recommend she take a 1/2 tab dyazide for the next week. Then use if she feels she starts retaining fluid again. I had [facility] in [facility] at [facility]), but I will place more refills for her on it today to her mail in pharmacy. If she does not have any at home she might have not picked up the [facility]azide [facility].

## 2020-11-16 NOTE — Telephone Encounter (Signed)
Spoke with patient regarding results/recommendations.  

## 2020-11-25 ENCOUNTER — Telehealth: Payer: Self-pay

## 2020-11-25 DIAGNOSIS — I10 Essential (primary) hypertension: Secondary | ICD-10-CM

## 2020-11-25 MED ORDER — TRIAMTERENE-HCTZ 37.5-25 MG PO CAPS: ORAL_CAPSULE | ORAL | 3 refills | Status: DC

## 2020-11-25 NOTE — Telephone Encounter (Signed)
[facility] calling about prescription for patient.  Please call [phone].  Prescription is wrote for "capsule" , directions say to take "1/2 tab"  They are saying they cannot break a "capsule".  There is not a "frequency" listed on medication.   They do have tablet in stock of this medication.  triamterene-hydrochlorothiazide (DYAZIDE) 37.5-25 MG capsule [[id number]]

## 2020-11-25 NOTE — Telephone Encounter (Signed)
Spoke with Jarrett Soho for [facility] she state we can send rx with note attached stating tabs and would like to know how oftern can pt take half a tab within a day, when needed. Please advise on new script

## 2020-11-25 NOTE — Telephone Encounter (Signed)
Corrected for them

## 2020-12-02 DIAGNOSIS — M25561 Pain in right knee: Secondary | ICD-10-CM | POA: Diagnosis not present

## 2020-12-08 DIAGNOSIS — M25561 Pain in right knee: Secondary | ICD-10-CM | POA: Diagnosis not present

## 2020-12-08 DIAGNOSIS — M1711 Unilateral primary osteoarthritis, right knee: Secondary | ICD-10-CM | POA: Diagnosis not present

## 2020-12-09 ENCOUNTER — Ambulatory Visit: Payer: Medicare HMO | Admitting: Family Medicine

## 2020-12-12 ENCOUNTER — Ambulatory Visit: Payer: Medicare HMO | Admitting: Cardiology

## 2020-12-12 ENCOUNTER — Other Ambulatory Visit: Payer: Self-pay

## 2020-12-12 ENCOUNTER — Encounter: Payer: Self-pay | Admitting: Cardiology

## 2020-12-12 VITALS — BP 110/82 | HR 72 | Ht 64.0 in | Wt 150.0 lb

## 2020-12-12 DIAGNOSIS — E782 Mixed hyperlipidemia: Secondary | ICD-10-CM

## 2020-12-12 DIAGNOSIS — E039 Hypothyroidism, unspecified: Secondary | ICD-10-CM

## 2020-12-12 DIAGNOSIS — N1831 Chronic kidney disease, stage 3a: Secondary | ICD-10-CM

## 2020-12-12 DIAGNOSIS — I10 Essential (primary) hypertension: Secondary | ICD-10-CM | POA: Diagnosis not present

## 2020-12-12 NOTE — Patient Instructions (Signed)

## 2020-12-12 NOTE — Progress Notes (Signed)
Cardiology Office Note:    Date:  12/12/2020   ID:  Denise Fuller, DOB 31-May-1935, MRN [id number]  PCP:  Denise Hillock, DO  Cardiologist:  Denise Campus, MD    Referring MD: Denise Hillock, DO   Chief Complaint  Patient presents with   Follow-up  Doing well  History of Present Illness:    Denise Fuller is a 85 y.o. female with past medical history significant for essential hypertension, dyslipidemia.  She was referred to [place] because of profound fatigue and tiredness.  She was eventually discovered to have B12 deficiency.  Cardiac work-up has been negative.  She comes today to my office for follow-up overall she is doing very well.  Denies have any chest pain tightness squeezing pressure burning chest.  She did have a very traumatic event just few weeks ago.  Apparently her daughter's dog bit her that she required surgical intervention and plate insertion today forearm.  Cardiac wise doing well denies have any chest pain tightness squeezing pressure burning chest.  Past Medical History:  Diagnosis Date   Abnormal SPEP 07/16/2019   B12 deficiency 07/16/2019   Bartonella infection    BCC (basal cell carcinoma of skin) 1997   nose   Chickenpox    Chronic bilateral low back pain without sciatica 06/23/2019   Chronic venous insufficiency    Diverticulosis    Dyspnea on exertion 07/10/2019   Elevated alkaline phosphatase level    Elevated LDH    Elevated serum immunoglobulin free light chains 07/21/2019   Environmental allergies    GAD (generalized anxiety disorder)    Goals of care, counseling/discussion 08/28/2019   Groin abscess    culture negative, doxy resolved abscess   Hepatitis A    Hyperlipidemia    Hypertension    Hypothyroidism    Ingrown nail 04/10/2018   Lightheadedness 12/03/2018   Lymphadenopathy, cervical    chronic per pt. (left)   Macular degeneration    Meniere disease    MGUS (monoclonal gammopathy of unknown significance) 08/28/2019   Other fatigue  12/03/2018   Overweight (BMI 25.0-29.9) 04/10/2018   Palpitations 12/03/2018   Thyroid disease    Weakness generalized 12/03/2018    Past Surgical History:  Procedure Laterality Date   ABDOMINAL HYSTERECTOMY  1991   APPENDECTOMY  1967   BASAL CELL CARCINOMA EXCISION  1997   nose   BLEPHAROPLASTY     CATARACT EXTRACTION W/ INTRAOCULAR LENS IMPLANT Bilateral Oct and Nov 2018   CHOLECYSTECTOMY  1967   ORIF WRIST FRACTURE Left 05/18/2020    Current Medications: Current Meds  Medication Sig   aspirin EC 81 MG tablet Take 81 mg by mouth daily.   calcium citrate-vitamin D (CITRACAL+D) 315-200 MG-UNIT tablet Take 1 tablet by mouth 2 (two) times daily.    cetirizine (ZYRTEC) 10 MG tablet Take 10 mg by mouth as needed for allergies.   Cholecalciferol (VITAMIN D3) 1000 units CAPS Take 1 capsule by mouth daily. 2,000 units daily   co-enzyme Q-10 30 MG capsule Take 30 mg by mouth 3 (three) times daily.    Cyanocobalamin (B-12 COMPLIANCE INJECTION) 1000 MCG/ML KIT Inject 1,000 mcg as directed every 30 (thirty) days. 08/28/2019 Weekly   DHA-EPA-Vitamin E (OMEGA-3 COMPLEX PO) Take 360 mg by mouth daily.   ELDERBERRY PO Take 1 tablet by mouth daily. Unkown strength   Flaxseed, Linseed, (FLAX SEED OIL PO) Take 1,300 mg by mouth 2 (two) times daily.   glucosamine-chondroitin 500-400 MG tablet Take 1  tablet by mouth 3 (three) times daily.    levothyroxine (SYNTHROID) 112 MCG tablet TAKE 1 TABLET DAILY ON AN EMPTY STOMACH 6 DAYS A WEEK AND 1/2 TABLET ON SUNDAYS. DISCONTINUE OTHER DOSES/MED CHANGE (Patient taking differently: Take 112 mcg by mouth daily before breakfast. TAKE 1 TABLET DAILY ON AN EMPTY STOMACH 6 DAYS A WEEK AND 1/2 TABLET ON SUNDAYS. DISCONTINUE OTHER DOSES/MED CHANGE)   lisinopril (ZESTRIL) 5 MG tablet Take 1 tablet (5 mg total) by mouth daily.   Multiple Vitamins-Minerals (MULTIPLE VITAMINS/WOMENS PO) Take 1 tablet by mouth daily. Unknown strength   Multiple Vitamins-Minerals  (PRESERVISION AREDS 2 PO) Take 1 tablet by mouth daily. Unknown strenght   Polyethyl Glycol-Propyl Glycol (SYSTANE OP) Apply 1 drop to eye as needed (Eye dryness).   triamterene-hydrochlorothiazide (DYAZIDE) 37.5-25 MG capsule Take 1/2 tab daily as need for fluid/swelling only.  No more than 1 tab daily (Patient taking differently: Take 1 capsule by mouth See admin instructions. Take 1/2 tab daily as need for fluid/swelling only.  No more than 1 tab daily)     Allergies:   Doxycycline, Keflex [cephalexin], and Iron   Social History   Socioeconomic History   Marital status: Married    Spouse name: [name]   Number of children: 3   Years of education: Not on file   Highest education level: Not on file  Occupational History   Occupation: retired  Tobacco Use   Smoking status: Never   Smokeless tobacco: Never  Vaping Use   Vaping Use: Never used  Substance and Sexual Activity   Alcohol use: No   Drug use: No   Sexual activity: Yes    Partners: Male    Comment: married  Other Topics Concern   Not on file  Social History Narrative   Retired. Married to Denise Fuller. They have 3 children [name])   [place] from [place] 05/2014.    Has 2 older dogs.    Retired.    Drinks caffeine, takes herbal remedies and dialy vitamin.   Wears her seatbelt, smoke detector in the home   Wears dentures, no assistive devices for walking - independent.    Feels safe in her relationships.    Social Determinants of Health   Financial Resource Strain: Low Risk    Difficulty of Paying Living Expenses: Not hard at all  Food Insecurity: No Food Insecurity   Worried About Charity fundraiser in the Last Year: Never true   Success in the Last Year: Never true  Transportation Needs: No Transportation Needs   Lack of Transportation (Medical): No   Lack of Transportation (Non-Medical): No  Physical Activity: Insufficiently Active   Days of Exercise per Week: 6 days   Minutes of Exercise per  Session: 20 min  Stress: No Stress Concern Present   Feeling of Stress : Not at all  Social Connections: Moderately Isolated   Frequency of Communication with Friends and Family: More than three times a week   Frequency of Social Gatherings with Friends and Family: More than three times a week   Attends Religious Services: Never   Marine scientist or Organizations: No   Attends Music therapist: Never   Marital Status: Married     Family History: The patient's family history includes Aneurysm (age of onset: 50) in her father; Arthritis in her father and mother; Atrial fibrillation in her son; Heart disease in her mother; Stroke in her daughter; Thyroid disease  in her daughter. ROS:   Please see the history of present illness.    All 14 point review of systems negative except as described per history of present illness  EKGs/Labs/Other Studies Reviewed:      Recent Labs: 10/31/2020: ALT 14; BUN 12; Creatinine, Ser 0.83; Hemoglobin 12.5; Platelets 297.0; Potassium 3.7; Sodium 138; TSH 0.43  Recent Lipid Panel    Component Value Date/Time   CHOL 197 10/15/2018 0959   TRIG 108.0 10/15/2018 0959   HDL 64.60 10/15/2018 0959   CHOLHDL 3 10/15/2018 0959   VLDL 21.6 10/15/2018 0959   LDLCALC 111 (H) 10/15/2018 0959   LDLDIRECT 134.0 10/31/2020 1126    Physical Exam:    VS:  BP 110/82 (BP Location: Left Arm, Patient Position: Sitting)   Pulse 72   Ht 5' 4" (1.626 m)   Wt 150 lb (68 kg)   SpO2 97%   BMI 25.75 kg/m     Wt Readings from Last 3 Encounters:  12/12/20 150 lb (68 kg)  10/31/20 150 lb (68 kg)  04/05/20 157 lb (71.2 kg)     GEN:  Well nourished, well developed in no acute distress HEENT: Normal NECK: No JVD; No carotid bruits LYMPHATICS: No lymphadenopathy CARDIAC: RRR, no murmurs, no rubs, no gallops RESPIRATORY:  Clear to auscultation without rales, wheezing or rhonchi  ABDOMEN: Soft, non-tender, non-distended MUSCULOSKELETAL:  No edema; No  deformity  SKIN: Warm and dry LOWER EXTREMITIES: no swelling NEUROLOGIC:  Alert and oriented x 3 PSYCHIATRIC:  Normal affect   ASSESSMENT:    1. Primary hypertension   2. Hypothyroidism, unspecified type   3. Stage 3a chronic kidney disease ([place])   4. Mixed hyperlipidemia    PLAN:    In order of problems listed above:  Essential hypertension: Blood pressure seems to well controlled continue present management. Hypothyroidism that being followed by internal medicine team.  I did look at [facility] from [facility] which is 0.43 this is done on 10/31/2020. Dyslipidemia I do have data from September 2020 with LDL of 111 HDL 64.  Patient tells me that she did have cholesterol checked is also be elevated but she does not want to do anything about it because she said she been living 63 years with this and have no problem. We did talk about healthy lifestyle need to exercise on the regular basis and good diet which she is already doing   Medication Adjustments/Labs and Tests Ordered: Current medicines are reviewed at length with the patient today.  Concerns regarding medicines are outlined above.  No orders of the defined types were placed in this encounter.  Medication changes: No orders of the defined types were placed in this encounter.   Signed, Park Liter, MD, [facility] 12/12/2020 10:13 AM    [place]

## 2020-12-15 ENCOUNTER — Telehealth: Payer: Self-pay | Admitting: Cardiology

## 2020-12-15 ENCOUNTER — Telehealth: Payer: Self-pay

## 2020-12-15 NOTE — Telephone Encounter (Signed)
Attempted to contact pt and was unable to LVM 

## 2020-12-15 NOTE — Telephone Encounter (Signed)
New Message:      Please call, this is concerning the diagnosis patient saw on My Chart, especially the Stage 3 Chronic Kidney Disease.

## 2020-12-15 NOTE — Telephone Encounter (Signed)
Patient was in her mychart and noticed a NEW diagnois in her chart that has never seen before.  Please advise.  Stage 3a chronic kidney disease ([place]

## 2020-12-15 NOTE — Telephone Encounter (Signed)
Pt was advised that the chronic kidney dx was added 9/22 by her PCP. Pt advised to call PCP regarding information. Pt verbalized understanding and had no additional questions.

## 2020-12-16 ENCOUNTER — Ambulatory Visit (INDEPENDENT_AMBULATORY_CARE_PROVIDER_SITE_OTHER): Payer: Medicare HMO

## 2020-12-16 ENCOUNTER — Other Ambulatory Visit: Payer: Self-pay

## 2020-12-16 DIAGNOSIS — E538 Deficiency of other specified B group vitamins: Secondary | ICD-10-CM

## 2020-12-16 MED ORDER — CYANOCOBALAMIN 1000 MCG/ML IJ SOLN
1000.0000 ug | Freq: Once | INTRAMUSCULAR | Status: AC
  Administered 2020-12-16: 1000 ug via INTRAMUSCULAR

## 2020-12-16 NOTE — Telephone Encounter (Signed)
Pt states that cardiologist told her that this was a Dx from PCP. Pt is concerned because no one told her she had kidney disease.Please advise if Dx is correct or can be removed from health hx.

## 2020-12-16 NOTE — Progress Notes (Signed)
Per the orders of Dr. Raoul Pitch pt is here for B12 injection. Pt received injection in left deltoid, pt tolerated injection well. Pt will return in one month for next injection.   Please sign in absence of PCP

## 2020-12-19 NOTE — Telephone Encounter (Signed)
Please return patient's call. She called in concerned about the "kidney disease "diagnosis she saw on her MyChart.  Although "kidney disease "sounds very concerning, it merely means that her kidney function has decreased, which she was aware of.. Decreased kidney function goes in stages, and unfortunately someone named them "chronic kidney disease. "  Again, it just means her kidney function changed and mildly decreased.  And for reassurance purposes, her last kidney function returned to normal.

## 2020-12-19 NOTE — Telephone Encounter (Signed)
Spoke with pt regarding provider's instructions.  

## 2020-12-21 DIAGNOSIS — M25561 Pain in right knee: Secondary | ICD-10-CM | POA: Diagnosis not present

## 2020-12-26 DIAGNOSIS — J069 Acute upper respiratory infection, unspecified: Secondary | ICD-10-CM | POA: Diagnosis not present

## 2021-01-05 DIAGNOSIS — M25561 Pain in right knee: Secondary | ICD-10-CM | POA: Diagnosis not present

## 2021-01-09 DIAGNOSIS — M25561 Pain in right knee: Secondary | ICD-10-CM | POA: Diagnosis not present

## 2021-01-11 DIAGNOSIS — M25561 Pain in right knee: Secondary | ICD-10-CM | POA: Diagnosis not present

## 2021-01-17 DIAGNOSIS — M25561 Pain in right knee: Secondary | ICD-10-CM | POA: Diagnosis not present

## 2021-01-19 DIAGNOSIS — M1711 Unilateral primary osteoarthritis, right knee: Secondary | ICD-10-CM | POA: Diagnosis not present

## 2021-01-23 DIAGNOSIS — M25561 Pain in right knee: Secondary | ICD-10-CM | POA: Diagnosis not present

## 2021-02-05 DIAGNOSIS — R519 Headache, unspecified: Secondary | ICD-10-CM | POA: Diagnosis not present

## 2021-02-05 DIAGNOSIS — E079 Disorder of thyroid, unspecified: Secondary | ICD-10-CM | POA: Diagnosis not present

## 2021-02-05 DIAGNOSIS — Z881 Allergy status to other antibiotic agents status: Secondary | ICD-10-CM | POA: Diagnosis not present

## 2021-02-05 DIAGNOSIS — Z20822 Contact with and (suspected) exposure to covid-19: Secondary | ICD-10-CM | POA: Diagnosis not present

## 2021-02-05 DIAGNOSIS — E876 Hypokalemia: Secondary | ICD-10-CM | POA: Diagnosis not present

## 2021-02-05 DIAGNOSIS — J019 Acute sinusitis, unspecified: Secondary | ICD-10-CM | POA: Diagnosis not present

## 2021-02-05 DIAGNOSIS — R112 Nausea with vomiting, unspecified: Secondary | ICD-10-CM | POA: Diagnosis not present

## 2021-02-05 DIAGNOSIS — I10 Essential (primary) hypertension: Secondary | ICD-10-CM | POA: Diagnosis not present

## 2021-02-05 DIAGNOSIS — R110 Nausea: Secondary | ICD-10-CM | POA: Diagnosis not present

## 2021-02-05 DIAGNOSIS — Z79899 Other long term (current) drug therapy: Secondary | ICD-10-CM | POA: Diagnosis not present

## 2021-02-05 DIAGNOSIS — E860 Dehydration: Secondary | ICD-10-CM | POA: Diagnosis not present

## 2021-02-07 DIAGNOSIS — M25561 Pain in right knee: Secondary | ICD-10-CM | POA: Diagnosis not present

## 2021-02-09 ENCOUNTER — Encounter: Payer: Self-pay | Admitting: Family Medicine

## 2021-02-09 ENCOUNTER — Ambulatory Visit (INDEPENDENT_AMBULATORY_CARE_PROVIDER_SITE_OTHER): Payer: Medicare HMO | Admitting: Family Medicine

## 2021-02-09 ENCOUNTER — Other Ambulatory Visit: Payer: Self-pay

## 2021-02-09 VITALS — BP 182/93 | HR 85 | Temp 98.4°F | Wt 146.4 lb

## 2021-02-09 DIAGNOSIS — R519 Headache, unspecified: Secondary | ICD-10-CM | POA: Diagnosis not present

## 2021-02-09 DIAGNOSIS — R110 Nausea: Secondary | ICD-10-CM

## 2021-02-09 DIAGNOSIS — N3001 Acute cystitis with hematuria: Secondary | ICD-10-CM | POA: Diagnosis not present

## 2021-02-09 DIAGNOSIS — M25561 Pain in right knee: Secondary | ICD-10-CM | POA: Diagnosis not present

## 2021-02-09 DIAGNOSIS — I10 Essential (primary) hypertension: Secondary | ICD-10-CM | POA: Diagnosis not present

## 2021-02-09 DIAGNOSIS — E876 Hypokalemia: Secondary | ICD-10-CM

## 2021-02-09 NOTE — Patient Instructions (Addendum)
Take your antibiotic. Use your nausea medication as needed. Take your potassium pills as prescribed.  Take TWO of your 5 mg lisinopril tabs when you get home today. Take TWO more tomorrow morning. Check your blood pressure and heart rate daily and write these numbers down for review with Dr. Raoul Pitch in about 5-7d.

## 2021-02-09 NOTE — Progress Notes (Signed)
OFFICE VISIT  02/09/2021  CC:  Chief Complaint  Patient presents with   Follow-up    Hosp f/u.     HPI:    Patient is a 86 y.o. female who presents for f/u ED visit on 02/05/21->[facility]. I reviewed the entire encounter data from that visit today.  Has been having lingering PND/cough since acute resp illness around T-giving. Zpack helped at that time---no fever, SOB, or wheezing. Then 5 days ago she developed significant nausea without vomiting.  She also noted a headache in the sinus area/between eyes.  Felt general malaise. She went to urgent care and was diagnosed with possible sinus infection and prescribed Augmentin.  Was also prescribed prednisone. Continued to feel bad that day and her daughter brought her to the emergency room. Evaluation there was remarkable only for a borderline low potassium, 3.5.  She was prescribed 10 mEq of potassium twice daily x5 days (CBC and remainder of CMET normal, RSV/FLu/Covid testing all negative. Additionally, her urinalysis was abnormal and urine culture ended up growing Klebsiella which did show susceptibility to amoxicillin/clav. Of note she does not take the HCTZ/triamterene that is on her med list.  Currently she says she feels better--the sinus congestion/headache pain is much better.  Has minimal cough at this point. She denies dysuria, urinary urgency, or urinary frequency.  No fevers or abdominal pain or flank pain.  No abdominal pain.  No diarrhea.  Her BP is up significantly here today.  She has a history of hypertension but says when she monitors it at home it was consistently normal.  Admits she has not really checked it since the illness she started getting around Thanksgiving. Blood pressure in the emergency department 5 days ago was 172/80 on 1 check and 184/105 on another..  Heart rate was 99.  ROS as above, plus-->  no CP, no SOB, no wheezing,,no dizziness, no HAs, no rashes, no melena/hematochezia.  No polyuria or polydipsia.  No  myalgias or arthralgias.  No focal weakness, paresthesias, or tremors.  No acute vision or hearing abnormalities.  No recent changes in lower legs.   No palpitations.    Past Medical History:  Diagnosis Date   Abnormal SPEP 07/16/2019   B12 deficiency 07/16/2019   Bartonella infection    BCC (basal cell carcinoma of skin) 1997   nose   Chickenpox    Chronic bilateral low back pain without sciatica 06/23/2019   Chronic venous insufficiency    Diverticulosis    Dyspnea on exertion 07/10/2019   Elevated alkaline phosphatase level    Elevated LDH    Elevated serum immunoglobulin free light chains 07/21/2019   Environmental allergies    GAD (generalized anxiety disorder)    Goals of care, counseling/discussion 08/28/2019   Groin abscess    culture negative, doxy resolved abscess   Hepatitis A    Hyperlipidemia    Hypertension    Hypothyroidism    Ingrown nail 04/10/2018   Lightheadedness 12/03/2018   Lymphadenopathy, cervical    chronic per pt. (left)   Macular degeneration    Meniere disease    MGUS (monoclonal gammopathy of unknown significance) 08/28/2019   Other fatigue 12/03/2018   Overweight (BMI 25.0-29.9) 04/10/2018   Palpitations 12/03/2018   Thyroid disease    Weakness generalized 12/03/2018    Past Surgical History:  Procedure Laterality Date   ABDOMINAL HYSTERECTOMY  1991   APPENDECTOMY  1967   BASAL CELL CARCINOMA EXCISION  1997   nose   BLEPHAROPLASTY  CATARACT EXTRACTION W/ INTRAOCULAR LENS IMPLANT Bilateral Oct and Nov 2018   CHOLECYSTECTOMY  1967   ORIF WRIST FRACTURE Left 05/18/2020    Outpatient Medications Prior to Visit  Medication Sig Dispense Refill   ondansetron (ZOFRAN-ODT) 4 MG disintegrating tablet Take by mouth.     potassium chloride (KLOR-CON) 10 MEQ tablet Take by mouth.     aspirin EC 81 MG tablet Take 81 mg by mouth daily.     calcium citrate-vitamin D (CITRACAL+D) 315-200 MG-UNIT tablet Take 1 tablet by mouth 2 (two) times daily.       cetirizine (ZYRTEC) 10 MG tablet Take 10 mg by mouth as needed for allergies.     Cholecalciferol (VITAMIN D3) 1000 units CAPS Take 1 capsule by mouth daily. 2,000 units daily     co-enzyme Q-10 30 MG capsule Take 30 mg by mouth 3 (three) times daily.      Cyanocobalamin (B-12 COMPLIANCE INJECTION) 1000 MCG/ML KIT Inject 1,000 mcg as directed every 30 (thirty) days. 08/28/2019 Weekly     DHA-EPA-Vitamin E (OMEGA-3 COMPLEX PO) Take 360 mg by mouth daily.     ELDERBERRY PO Take 1 tablet by mouth daily. Unkown strength     Flaxseed, Linseed, (FLAX SEED OIL PO) Take 1,300 mg by mouth 2 (two) times daily.     glucosamine-chondroitin 500-400 MG tablet Take 1 tablet by mouth 3 (three) times daily.      levothyroxine (SYNTHROID) 112 MCG tablet TAKE 1 TABLET DAILY ON AN EMPTY STOMACH 6 DAYS A WEEK AND 1/2 TABLET ON SUNDAYS. DISCONTINUE OTHER DOSES/MED CHANGE (Patient taking differently: Take 112 mcg by mouth daily before breakfast. TAKE 1 TABLET DAILY ON AN EMPTY STOMACH 6 DAYS A WEEK AND 1/2 TABLET ON SUNDAYS. DISCONTINUE OTHER DOSES/MED CHANGE) 86 tablet 3   lisinopril (ZESTRIL) 5 MG tablet Take 1 tablet (5 mg total) by mouth daily. 90 tablet 1   Multiple Vitamins-Minerals (MULTIPLE VITAMINS/WOMENS PO) Take 1 tablet by mouth daily. Unknown strength     Polyethyl Glycol-Propyl Glycol (SYSTANE OP) Apply 1 drop to eye as needed (Eye dryness).     Multiple Vitamins-Minerals (PRESERVISION AREDS 2 PO) Take 1 tablet by mouth daily. Unknown strenght     triamterene-hydrochlorothiazide (DYAZIDE) 37.5-25 MG capsule Take 1/2 tab daily as need for fluid/swelling only.  No more than 1 tab daily (Patient taking differently: Take 1 capsule by mouth See admin instructions. Take 1/2 tab daily as need for fluid/swelling only.  No more than 1 tab daily) 45 capsule 3   No facility-administered medications prior to visit.    Allergies  Allergen Reactions   Doxycycline     GI upset   Keflex [Cephalexin] Hives and Itching    Iron Rash    ROS As per HPI  PE: Vitals with BMI 02/09/2021 12/12/2020 10/31/2020  Height - $Remove'5\' 4"'iuqEHFi$  $RemoveB'5\' 4"'RQWkylcG$   Weight 146 lbs 6 oz 150 lbs 150 lbs  BMI - 53.29 92.42  Systolic 683 419 622  Diastolic 93 82 69  Pulse 85 72 84  Several BPs taken today: 297L to 892 systolic over 11-941 diastolic.  Heart rate 80s.   Physical Exam  Gen: Alert, well appearing.  Patient is oriented to person, place, time, and situation. AFFECT: pleasant, lucid thought and speech. CV: RRR, no m/r/g.   LUNGS: CTA bilat, nonlabored resps, good aeration in all lung fields. EXT: no clubbing or cyanosis.  no edema.    LABS:  Last CBC Lab Results  Component Value Date  WBC 7.4 10/31/2020   HGB 12.5 10/31/2020   HCT 38.1 10/31/2020   MCV 89.1 10/31/2020   MCH 29.4 08/28/2019   RDW 14.5 10/31/2020   PLT 297.0 73/42/8768   Last metabolic panel Lab Results  Component Value Date   GLUCOSE 85 10/31/2020   NA 138 10/31/2020   K 3.7 10/31/2020   CL 101 10/31/2020   CO2 29 10/31/2020   BUN 12 10/31/2020   CREATININE 0.83 10/31/2020   GFRNONAA 49 (L) 08/28/2019   CALCIUM 9.3 10/31/2020   PROT 6.5 10/31/2020   PROT 6.5 10/31/2020   ALBUMIN 3.9 10/31/2020   LABGLOB 3.2 08/28/2019   AGRATIO 1.1 08/28/2019   BILITOT 0.3 10/31/2020   ALKPHOS 152 (H) 10/31/2020   AST 17 10/31/2020   ALT 14 10/31/2020   ANIONGAP 8 08/28/2019   IMPRESSION AND PLAN:  #1 nausea and malaise.  She did have some lingering respiratory symptoms that seem to worsen from an upper respiratory standpoint.  Seems to be improving on Augmentin.  We will continue this but will hold off on starting the prednisone she was prescribed on 02/05/2021. Continue Zofran as needed.  2.  Borderline hypokalemia. She was prescribed some low-dose potassium and we will recheck electrolytes and creatinine today.    #3 Klebsiella UTI.  Her nausea may be coming from this. Continue Augmentin.  #4 elevated blood pressure. She does not take excessive  NSAIDs.  No decongestants. Plan:Take TWO of your 5 mg lisinopril tabs when you get home today. Take TWO more tomorrow morning. Monitor blood pressure at home tonight and tomorrow and I will see her in the afternoon tomorrow for recheck.  An After Visit Summary was printed and given to the patient.  FOLLOW UP: Return today (on 02/09/2021) for Follow-up hypertension..  Signed:  Crissie Sickles, MD           02/09/2021

## 2021-02-10 ENCOUNTER — Encounter: Payer: Self-pay | Admitting: Family Medicine

## 2021-02-10 ENCOUNTER — Ambulatory Visit (INDEPENDENT_AMBULATORY_CARE_PROVIDER_SITE_OTHER): Payer: Medicare HMO | Admitting: Family Medicine

## 2021-02-10 VITALS — BP 163/96 | HR 100 | Temp 97.9°F | Ht 64.0 in | Wt 145.2 lb

## 2021-02-10 DIAGNOSIS — E876 Hypokalemia: Secondary | ICD-10-CM | POA: Diagnosis not present

## 2021-02-10 DIAGNOSIS — E538 Deficiency of other specified B group vitamins: Secondary | ICD-10-CM | POA: Diagnosis not present

## 2021-02-10 DIAGNOSIS — I10 Essential (primary) hypertension: Secondary | ICD-10-CM | POA: Diagnosis not present

## 2021-02-10 LAB — BASIC METABOLIC PANEL
BUN: 6 mg/dL (ref 6–23)
CO2: 29 mEq/L (ref 19–32)
Calcium: 9.5 mg/dL (ref 8.4–10.5)
Chloride: 103 mEq/L (ref 96–112)
Creatinine, Ser: 0.68 mg/dL (ref 0.40–1.20)
GFR: 80.68 mL/min (ref >60.00)
Glucose, Bld: 91 mg/dL (ref 70–99)
Potassium: 3.8 mEq/L (ref 3.5–5.1)
Sodium: 140 mEq/L (ref 135–145)

## 2021-02-10 MED ORDER — CYANOCOBALAMIN 1000 MCG/ML IJ SOLN
1000.0000 ug | Freq: Once | INTRAMUSCULAR | Status: AC
  Administered 2021-02-10: 1000 ug via INTRAMUSCULAR

## 2021-02-10 MED ORDER — LISINOPRIL 20 MG PO TABS: 20.0000 mg | ORAL_TABLET | Freq: Every day | ORAL | 0 refills | Status: DC

## 2021-02-10 NOTE — Progress Notes (Signed)
OFFICE VISIT  02/10/2021  CC:  Chief Complaint  Patient presents with   Follow-up    Hypertension    HPI:    Patient is a 86 y.o. female who presents accompanied by her daughter for 1 day follow-up uncontrolled hypertension. A/P as of last visit: "#1 nausea and malaise.  She did have some lingering respiratory symptoms that seem to worsen from an upper respiratory standpoint.  Seems to be improving on Augmentin.  We will continue this but will hold off on starting the prednisone she was prescribed on 02/05/2021. Continue Zofran as needed.  2.  Borderline hypokalemia. She was prescribed some low-dose potassium and we will recheck electrolytes and creatinine today.     #3 Klebsiella UTI.  Her nausea may be coming from this. Continue Augmentin.   #4 elevated blood pressure. She does not take excessive NSAIDs.  No decongestants. Plan:Take TWO of your 5 mg lisinopril tabs when you get home today. Take TWO more tomorrow morning. Monitor blood pressure at home tonight and tomorrow and I will see her in the afternoon tomorrow for recheck."  INTERIM HX: She continues to feel better. Home blood pressures 150s to 190s over 90s to low 100s. Taking lisinopril 10 mg total dose as instructed. Continues on the Augmentin. Recheck of her potassium was normal, as was the remainder of her metabolic panel.  No headache, no vision changes, no dizziness, no fevers.  Past Medical History:  Diagnosis Date   Abnormal SPEP 07/16/2019   B12 deficiency 07/16/2019   Bartonella infection    BCC (basal cell carcinoma of skin) 1997   nose   Chickenpox    Chronic bilateral low back pain without sciatica 06/23/2019   Chronic venous insufficiency    Diverticulosis    Dyspnea on exertion 07/10/2019   Elevated alkaline phosphatase level    Elevated LDH    Elevated serum immunoglobulin free light chains 07/21/2019   Environmental allergies    GAD (generalized anxiety disorder)    Goals of care,  counseling/discussion 08/28/2019   Groin abscess    culture negative, doxy resolved abscess   Hepatitis A    Hyperlipidemia    Hypertension    Hypothyroidism    Ingrown nail 04/10/2018   Lightheadedness 12/03/2018   Lymphadenopathy, cervical    chronic per pt. (left)   Macular degeneration    Meniere disease    MGUS (monoclonal gammopathy of unknown significance) 08/28/2019   Other fatigue 12/03/2018   Overweight (BMI 25.0-29.9) 04/10/2018   Palpitations 12/03/2018   Thyroid disease    Weakness generalized 12/03/2018    Past Surgical History:  Procedure Laterality Date   ABDOMINAL HYSTERECTOMY  1991   APPENDECTOMY  1967   BASAL CELL CARCINOMA EXCISION  1997   nose   BLEPHAROPLASTY     CATARACT EXTRACTION W/ INTRAOCULAR LENS IMPLANT Bilateral Oct and Nov 2018   CHOLECYSTECTOMY  1967   ORIF WRIST FRACTURE Left 05/18/2020    Outpatient Medications Prior to Visit  Medication Sig Dispense Refill   aspirin EC 81 MG tablet Take 81 mg by mouth daily.     calcium citrate-vitamin D (CITRACAL+D) 315-200 MG-UNIT tablet Take 1 tablet by mouth 2 (two) times daily.      cetirizine (ZYRTEC) 10 MG tablet Take 10 mg by mouth as needed for allergies.     Cholecalciferol (VITAMIN D3) 1000 units CAPS Take 1 capsule by mouth daily. 2,000 units daily     co-enzyme Q-10 30 MG capsule Take 30 mg by  mouth 3 (three) times daily.      Cyanocobalamin (B-12 COMPLIANCE INJECTION) 1000 MCG/ML KIT Inject 1,000 mcg as directed every 30 (thirty) days. 08/28/2019 Weekly     DHA-EPA-Vitamin E (OMEGA-3 COMPLEX PO) Take 360 mg by mouth daily.     ELDERBERRY PO Take 1 tablet by mouth daily. Unkown strength     Flaxseed, Linseed, (FLAX SEED OIL PO) Take 1,300 mg by mouth 2 (two) times daily.     glucosamine-chondroitin 500-400 MG tablet Take 1 tablet by mouth 3 (three) times daily.      levothyroxine (SYNTHROID) 112 MCG tablet TAKE 1 TABLET DAILY ON AN EMPTY STOMACH 6 DAYS A WEEK AND 1/2 TABLET ON SUNDAYS. DISCONTINUE  OTHER DOSES/MED CHANGE (Patient taking differently: Take 112 mcg by mouth daily before breakfast. TAKE 1 TABLET DAILY ON AN EMPTY STOMACH 6 DAYS A WEEK AND 1/2 TABLET ON SUNDAYS. DISCONTINUE OTHER DOSES/MED CHANGE) 86 tablet 3   Multiple Vitamins-Minerals (MULTIPLE VITAMINS/WOMENS PO) Take 1 tablet by mouth daily. Unknown strength     ondansetron (ZOFRAN-ODT) 4 MG disintegrating tablet Take by mouth.     Polyethyl Glycol-Propyl Glycol (SYSTANE OP) Apply 1 drop to eye as needed (Eye dryness).     potassium chloride (KLOR-CON) 10 MEQ tablet Take by mouth.     lisinopril (ZESTRIL) 5 MG tablet Take 1 tablet (5 mg total) by mouth daily. 90 tablet 1   No facility-administered medications prior to visit.    Allergies  Allergen Reactions   Doxycycline     GI upset   Keflex [Cephalexin] Hives and Itching   Iron Rash    ROS As per HPI  PE: Vitals with BMI 02/10/2021 02/09/2021 12/12/2020  Height $Remov'5\' 4"'ZgENMq$  - $'5\' 4"'U$   Weight 145 lbs 3 oz 146 lbs 6 oz 150 lbs  BMI 06.30 - 16.01  Systolic 093 235 573  Diastolic 96 93 82  Pulse 220 85 72  02 sat 94% on RA today  Physical Exam  Gen: Alert, well appearing.  Patient is oriented to person, place, time, and situation. AFFECT: pleasant, lucid thought and speech. CV: RRR, no m/r/g.   LUNGS: CTA bilat, nonlabored resps, good aeration in all lung fields. EXT: no clubbing or cyanosis.  no edema.    LABS:    Chemistry      Component Value Date/Time   NA 140 02/09/2021 1710   K 3.8 02/09/2021 1710   CL 103 02/09/2021 1710   CO2 29 02/09/2021 1710   BUN 6 02/09/2021 1710   CREATININE 0.68 02/09/2021 1710   CREATININE 1.07 (H) 08/28/2019 1356      Component Value Date/Time   CALCIUM 9.5 02/09/2021 1710   ALKPHOS 152 (H) 10/31/2020 1126   AST 17 10/31/2020 1126   AST 18 08/28/2019 1356   ALT 14 10/31/2020 1126   ALT 16 08/28/2019 1356   BILITOT 0.3 10/31/2020 1126   BILITOT 0.3 08/28/2019 1356     IMPRESSION AND PLAN:  #1 uncontrolled  hypertension.  Suspect some of this is due to the acute stress of her recent illness.  Will increase her lisinopril to 15 mg a day for the next couple of days.  She will then start a 20 mg daily dose of lisinopril unless she has reached her goal blood pressure of 130/80.  #2 borderline hypokalemia.  She took some to potassium supplement and this has normalized now.  No further potassium supplement needed.  3.  UTI-continues on Augmentin.  An After Visit Summary was  printed and given to the patient.  FOLLOW UP: Return for 5-7d f/u HTN.  Signed:  Crissie Sickles, MD           02/10/2021

## 2021-02-10 NOTE — Patient Instructions (Addendum)
Increase your lisinopril to 15mg  daily (3 of your 5mg  tabs).  On Monday 1/9 start taking the lisinopril 20mg  tab once a day.  Goal blood pressure is 130 on top and 80 on bottom.

## 2021-02-14 ENCOUNTER — Encounter: Payer: Self-pay | Admitting: Family Medicine

## 2021-02-14 ENCOUNTER — Other Ambulatory Visit: Payer: Self-pay

## 2021-02-14 ENCOUNTER — Ambulatory Visit (INDEPENDENT_AMBULATORY_CARE_PROVIDER_SITE_OTHER): Payer: Medicare HMO | Admitting: Family Medicine

## 2021-02-14 VITALS — BP 122/80 | HR 91 | Temp 98.1°F | Ht 64.0 in | Wt 141.0 lb

## 2021-02-14 DIAGNOSIS — E782 Mixed hyperlipidemia: Secondary | ICD-10-CM

## 2021-02-14 DIAGNOSIS — I10 Essential (primary) hypertension: Secondary | ICD-10-CM

## 2021-02-14 MED ORDER — LISINOPRIL 20 MG PO TABS: 20.0000 mg | ORAL_TABLET | Freq: Every day | ORAL | 1 refills | Status: DC

## 2021-02-14 NOTE — Progress Notes (Signed)
Denise Fuller , 03-14-1935, 86 y.o., female MRN: [id number] Patient Care Team    Relationship Specialty Notifications Start End  Ma Hillock, DO PCP - General Family Medicine  01/10/16   Otelia Sergeant, OD Referring Physician   01/10/16    Comment: ophth- macular degeneration  [facility]  04/26/17     Chief Complaint  Patient presents with   Hypertension    [place]; pt is not  fasting      Subjective: Denise Fuller is a 86 y.o. female present for  Essential hypertension/HLD Pt reports compliance with lisinopril new increased dose of 20  mg a day. Patient denies chest pain, shortness of breath, dizziness or lower extremity edema. She has been seen by another provider after experiencing high blood pressure readings. Her lisinopril was increased from 5 mg tapered to 20 mg. Her home pressures are mildly above goal averaging 140s/90s. Pt takesdaily baby ASA.    Depression screen [facility] 2/9 02/10/2021 03/23/2020 10/15/2018 04/10/2018 04/26/2017  Decreased Interest 0 0 0 0 0  Down, Depressed, Hopeless 0 0 0 0 0  PHQ - 2 Score 0 0 0 0 0  Altered sleeping - - - 0 -  Tired, decreased energy - - - 0 -  Change in appetite - - - 0 -  Feeling bad or failure about yourself  - - - 0 -  Trouble concentrating - - - 0 -  Moving slowly or fidgety/restless - - - 0 -  Suicidal thoughts - - - 0 -  PHQ-9 Score - - - 0 -  Difficult doing work/chores - - - Not difficult at all -    Allergies  Allergen Reactions   Doxycycline     GI upset   Keflex [Cephalexin] Hives and Itching   Iron Rash   Social History   Social History Narrative   Retired. Married to [place]. They have 3 children [name])   [place] from [place] 05/2014.    Has 2 older dogs.    Retired.    Drinks caffeine, takes herbal remedies and dialy vitamin.   Wears her seatbelt, smoke detector in the home   Wears dentures, no assistive devices for walking - independent.    Feels safe in her relationships.    Past  Medical History:  Diagnosis Date   Abnormal SPEP 07/16/2019   B12 deficiency 07/16/2019   Bartonella infection    BCC (basal cell carcinoma of skin) 1997   nose   Chickenpox    Chronic bilateral low back pain without sciatica 06/23/2019   Chronic venous insufficiency    Diverticulosis    Dyspnea on exertion 07/10/2019   Elevated alkaline phosphatase level    Elevated LDH    Elevated serum immunoglobulin free light chains 07/21/2019   Environmental allergies    GAD (generalized anxiety disorder)    Goals of care, counseling/discussion 08/28/2019   Groin abscess    culture negative, doxy resolved abscess   Hepatitis A    Hyperlipidemia    Hypertension    Hypothyroidism    Ingrown nail 04/10/2018   Lightheadedness 12/03/2018   Lymphadenopathy, cervical    chronic per pt. (left)   Macular degeneration    Meniere disease    MGUS (monoclonal gammopathy of unknown significance) 08/28/2019   Other fatigue 12/03/2018   Overweight (BMI 25.0-29.9) 04/10/2018   Palpitations 12/03/2018   Thyroid disease    Weakness generalized 12/03/2018   Past Surgical History:  Procedure Laterality Date   ABDOMINAL HYSTERECTOMY  1991   APPENDECTOMY  1967   BASAL CELL CARCINOMA EXCISION  1997   nose   BLEPHAROPLASTY     CATARACT EXTRACTION W/ INTRAOCULAR LENS IMPLANT Bilateral Oct and Nov 2018   CHOLECYSTECTOMY  1967   ORIF WRIST FRACTURE Left 05/18/2020   Family History  Problem Relation Age of Onset   Arthritis Mother    Heart disease Mother    Arthritis Father    Aneurysm Father 62       brain   Thyroid disease Daughter    Stroke Daughter        2020   Atrial fibrillation Son        2020    All past medical history, surgical history, allergies, family history, immunizations andmedications were updated in the EMR today and reviewed under the history and medication portions of their EMR.     ROS: Negative, with the exception of above mentioned in HPI   Objective:  BP 122/80    Pulse 91     Temp 98.1 F (36.7 C) (Oral)    Ht 5\' 4"  (1.626 m)    Wt 141 lb (64 kg)    SpO2 95%    BMI 24.20 kg/m  Body mass index is 24.2 kg/m. Physical Exam Vitals and nursing note reviewed.  Constitutional:      General: She is not in acute distress.    Appearance: Normal appearance. She is not ill-appearing, toxic-appearing or diaphoretic.  HENT:     Head: Normocephalic and atraumatic.     Mouth/Throat:     Mouth: Mucous membranes are moist.  Eyes:     General: No scleral icterus.       Right eye: No discharge.        Left eye: No discharge.     Extraocular Movements: Extraocular movements intact.     Conjunctiva/sclera: Conjunctivae normal.     Pupils: Pupils are equal, round, and reactive to light.  Cardiovascular:     Rate and Rhythm: Normal rate and regular rhythm.  Pulmonary:     Effort: Pulmonary effort is normal. No respiratory distress.     Breath sounds: Normal breath sounds. No wheezing, rhonchi or rales.  Musculoskeletal:     Cervical back: Neck supple. No tenderness.  Lymphadenopathy:     Cervical: No cervical adenopathy.  Skin:    General: Skin is warm and dry.     Coloration: Skin is not jaundiced or pale.     Findings: No erythema or rash.  Neurological:     Mental Status: She is alert and oriented to person, place, and time. Mental status is at baseline.     Motor: No weakness.     Gait: Gait normal.  Psychiatric:        Mood and Affect: Mood normal.        Behavior: Behavior normal.        Thought Content: Thought content normal.        Judgment: Judgment normal.     No results found. No results found. No results found for this or any previous visit (from the past 24 hour(s)).   Assessment/Plan: Denise Fuller is a 86 y.o. female present for OV for recent elevated BP HTN/Mixed hyperlipidemia Stable.  Dc dyazide and use only if swelling is appreciated.  Continue lisinopril at increased dose of 20 mg a day. Do not recommend monitoring any further at home  unless dizziness occurs. Anxiety is  driving up home pressures.  Keep routine follow up in March already scheduled    Reviewed expectations re: course of current medical issues. Discussed self-management of symptoms. Outlined signs and symptoms indicating need for more acute intervention. Patient verbalized understanding and all questions were answered. Patient received an After-Visit Summary.   No orders of the defined types were placed in this encounter.  No orders of the defined types were placed in this encounter.   Referral Orders  No referral(s) requested today     Note is dictated utilizing voice recognition software. Although note has been proof read prior to signing, occasional typographical errors still can be missed. If any questions arise, please do not hesitate to call for verification.   electronically signed by:  Howard Pouch, DO  [place]

## 2021-02-14 NOTE — Patient Instructions (Addendum)
°  Continue lisinopril 20 mg a day. I have called in new refills for you.  You do not need to continue checking taking your blood pressure with cuff, unless you have symptoms.

## 2021-03-06 ENCOUNTER — Other Ambulatory Visit: Payer: Self-pay

## 2021-03-07 ENCOUNTER — Encounter: Payer: Self-pay | Admitting: Family Medicine

## 2021-03-07 ENCOUNTER — Ambulatory Visit (INDEPENDENT_AMBULATORY_CARE_PROVIDER_SITE_OTHER): Payer: Medicare HMO | Admitting: Family Medicine

## 2021-03-07 VITALS — BP 138/84 | HR 73 | Temp 97.6°F | Ht 64.0 in | Wt 142.0 lb

## 2021-03-07 DIAGNOSIS — R768 Other specified abnormal immunological findings in serum: Secondary | ICD-10-CM

## 2021-03-07 DIAGNOSIS — R4189 Other symptoms and signs involving cognitive functions and awareness: Secondary | ICD-10-CM | POA: Diagnosis not present

## 2021-03-07 DIAGNOSIS — N1831 Chronic kidney disease, stage 3a: Secondary | ICD-10-CM

## 2021-03-07 DIAGNOSIS — E538 Deficiency of other specified B group vitamins: Secondary | ICD-10-CM | POA: Diagnosis not present

## 2021-03-07 DIAGNOSIS — E039 Hypothyroidism, unspecified: Secondary | ICD-10-CM

## 2021-03-07 DIAGNOSIS — Z113 Encounter for screening for infections with a predominantly sexual mode of transmission: Secondary | ICD-10-CM | POA: Diagnosis not present

## 2021-03-07 LAB — TSH: TSH: 0.05 u[IU]/mL — ABNORMAL LOW (ref 0.35–5.50)

## 2021-03-07 LAB — VITAMIN B12: Vitamin B-12: 545 pg/mL (ref 211–911)

## 2021-03-07 LAB — VITAMIN D 25 HYDROXY (VIT D DEFICIENCY, FRACTURES): VITD: 51.9 ng/mL (ref 30.00–100.00)

## 2021-03-07 MED ORDER — CYANOCOBALAMIN 1000 MCG/ML IJ SOLN
1000.0000 ug | Freq: Once | INTRAMUSCULAR | Status: AC
  Administered 2021-03-07: 1000 ug via INTRAMUSCULAR

## 2021-03-07 NOTE — Progress Notes (Signed)
This visit occurred during the SARS-CoV-2 public health emergency.  Safety protocols were in place, including screening questions prior to the visit, additional usage of staff PPE, and extensive cleaning of exam room while observing appropriate contact time as indicated for disinfecting solutions.    Denise Fuller , 03-22-1935, 86 y.o., female MRN: [id number] Patient Care Team    Relationship Specialty Notifications Start End  Ma Hillock, DO PCP - General Family Medicine  01/10/16   Otelia Sergeant, OD Referring Physician   01/10/16    Comment: ophth- macular degeneration  [facility]  04/26/17     Chief Complaint  Patient presents with   Memory Loss    Pt reports memory changes that has gradually worsen in the last year;      Subjective: Pt presents for an OV with her daughter today to discuss cognitive changes that have been noticed over the last year. Pt reports she has noticed she is more forgetful of appts and details of conversations. Her daughter reports the family noticed changes a little of a year ago. Started with forgetting having conversations. Daughter reports last couple of months there has been changes in her cooking, forgetting appts, even forgetting she wrote down appts, more details of conversations. When she is told she forgot she has started to become more "elevated." She has h/o b12 def and thyroid disorder. She reports compliance with levothyroxine and monthly b12 inj.  She has had a h/o of abnormal spep, which has been stable. No h/o  stroke, but endorses today her eye doctor told her she was having "mini-strokes of her eye." She takes a baby ASA and BP is well controlled on medication.   Depression screen [facility] 2/9 02/10/2021 03/23/2020 10/15/2018 04/10/2018 04/26/2017  Decreased Interest 0 0 0 0 0  Down, Depressed, Hopeless 0 0 0 0 0  PHQ - 2 Score 0 0 0 0 0  Altered sleeping - - - 0 -  Tired, decreased energy - - - 0 -  Change in appetite - - - 0 -   Feeling bad or failure about yourself  - - - 0 -  Trouble concentrating - - - 0 -  Moving slowly or fidgety/restless - - - 0 -  Suicidal thoughts - - - 0 -  PHQ-9 Score - - - 0 -  Difficult doing work/chores - - - Not difficult at all -    Allergies  Allergen Reactions   Doxycycline     GI upset   Keflex [Cephalexin] Hives and Itching   Iron Rash   Social History   Social History Narrative   Retired. Married to [place]. 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    Has 2 older dogs.    Retired.    Drinks caffeine, takes herbal remedies and dialy vitamin.   Wears her seatbelt, smoke detector in the home   Wears dentures, no assistive devices for walking - independent.    Feels safe in her relationships.    Past Medical History:  Diagnosis Date   Abnormal SPEP 07/16/2019   B12 deficiency 07/16/2019   Bartonella infection    BCC (basal cell carcinoma of skin) 1997   nose   Chickenpox    Chronic bilateral low back pain without sciatica 06/23/2019   Chronic venous insufficiency    Diverticulosis    Dyspnea on exertion 07/10/2019   Elevated alkaline phosphatase level  Elevated LDH    Elevated serum immunoglobulin free light chains 07/21/2019   Environmental allergies    GAD (generalized anxiety disorder)    Goals of care, counseling/discussion 08/28/2019   Groin abscess    culture negative, doxy resolved abscess   Hepatitis A    Hyperlipidemia    Hypertension    Hypothyroidism    Ingrown nail 04/10/2018   Lightheadedness 12/03/2018   Lymphadenopathy, cervical    chronic per pt. (left)   Macular degeneration    Meniere disease    MGUS (monoclonal gammopathy of unknown significance) 08/28/2019   Other fatigue 12/03/2018   Overweight (BMI 25.0-29.9) 04/10/2018   Palpitations 12/03/2018   Thyroid disease    Weakness generalized 12/03/2018   Past Surgical History:  Procedure Laterality Date   ABDOMINAL HYSTERECTOMY  1991   APPENDECTOMY   1967   BASAL CELL CARCINOMA EXCISION  1997   nose   BLEPHAROPLASTY     CATARACT EXTRACTION W/ INTRAOCULAR LENS IMPLANT Bilateral Oct and Nov 2018   CHOLECYSTECTOMY  1967   ORIF WRIST FRACTURE Left 05/18/2020   Family History  Problem Relation Age of Onset   Arthritis Mother    Heart disease Mother    Arthritis Father    Aneurysm Father 35       brain   Thyroid disease Daughter    Stroke Daughter        2020   Atrial fibrillation Son        2020   Allergies as of 03/07/2021       Reactions   Doxycycline    GI upset   Keflex [cephalexin] Hives, Itching   Iron Rash        Medication List        Accurate as of March 07, 2021  9:56 AM. If you have any questions, ask your nurse or doctor.          STOP taking these medications    cetirizine 10 MG tablet Commonly known as: ZYRTEC Stopped by: Howard Pouch, DO   ELDERBERRY PO Stopped by: Howard Pouch, DO   glucosamine-chondroitin 500-400 MG tablet Stopped by: Howard Pouch, DO   potassium chloride 10 MEQ tablet Commonly known as: KLOR-CON Stopped by: Howard Pouch, DO       TAKE these medications    aspirin EC 81 MG tablet Take 81 mg by mouth daily.   B-12 Compliance Injection 1000 MCG/ML Kit Generic drug: Cyanocobalamin Inject 1,000 mcg as directed every 30 (thirty) days. 08/28/2019 Weekly   calcium citrate-vitamin D 315-200 MG-UNIT tablet Commonly known as: CITRACAL+D Take 1 tablet by mouth 2 (two) times daily.   co-enzyme Q-10 30 MG capsule Take 30 mg by mouth 3 (three) times daily.   FLAX SEED OIL PO Take 1,300 mg by mouth 2 (two) times daily.   levothyroxine 112 MCG tablet Commonly known as: SYNTHROID TAKE 1 TABLET DAILY ON AN EMPTY STOMACH 6 DAYS A WEEK AND 1/2 TABLET ON SUNDAYS. DISCONTINUE OTHER DOSES/MED CHANGE What changed:  how much to take how to take this when to take this   lisinopril 20 MG tablet Commonly known as: ZESTRIL Take 1 tablet (20 mg total) by mouth daily.    MULTIPLE VITAMINS/WOMENS PO Take 1 tablet by mouth daily. Unknown strength   OMEGA-3 COMPLEX PO Take 360 mg by mouth daily.   SYSTANE OP Apply 1 drop to eye as needed (Eye dryness).   Vitamin D3 25 MCG (1000 UT) Caps Take 1 capsule by mouth  daily. 2,000 units daily        All past medical history, surgical history, allergies, family history, immunizations andmedications were updated in the EMR today and reviewed under the history and medication portions of their EMR.     ROS Negative, with the exception of above mentioned in HPI   Objective:  BP 138/84    Pulse 73    Temp 97.6 F (36.4 C) (Oral)    Ht $R'5\' 4"'qq$  (1.626 m)    Wt 142 lb (64.4 kg)    SpO2 98%    BMI 24.37 kg/m  Body mass index is 24.37 kg/m.  Physical Exam Vitals and nursing note reviewed.  Constitutional:      General: She is not in acute distress.    Appearance: Normal appearance. She is normal weight. She is not ill-appearing or toxic-appearing.  Eyes:     Extraocular Movements: Extraocular movements intact.     Conjunctiva/sclera: Conjunctivae normal.     Pupils: Pupils are equal, round, and reactive to light.  Cardiovascular:     Rate and Rhythm: Normal rate and regular rhythm.     Heart sounds: No murmur heard. Neurological:     Mental Status: She is alert and oriented to person, place, and time. Mental status is at baseline.     Sensory: No sensory deficit.     Motor: No weakness.     Coordination: Coordination normal.     Gait: Gait normal.  Psychiatric:        Mood and Affect: Mood normal.        Behavior: Behavior normal.        Thought Content: Thought content normal.        Judgment: Judgment normal.     No results found. No results found. No results found for this or any previous visit (from the past 24 hour(s)).  Assessment/Plan: KASHARI CHALMERS is a 86 y.o. female present for OV for  Cognitive decline Discussed multiple potential causes of cognitive decline with patient and her  daughter today.  Discussed medications that can help. Could consider sleep apnea test if work negative.  - TSH - Vitamin B12 - Vitamin D (25 hydroxy) - RPR - MR Brain W Wo Contrast; Future - Multiple Myeloma Panel (SPEP&IFE w/QIG) - Urinalysis w microscopic + reflex cultur  Elevated serum immunoglobulin free light chains SPEP has been stable. Will check today since new concerns are rising.  - MR Brain W Wo Contrast; Future - Multiple Myeloma Panel (SPEP&IFE w/QIG)  Hypothyroidism, unspecified type Compliant with levo 112 x6 d and 1/2 tab x1 d - TSH B12 deficiency B12 inj provided today.  Stage 3a chronic kidney disease (HCC) - PTH, Intact and Calcium  Reviewed expectations re: course of current medical issues. Discussed self-management of symptoms. Outlined signs and symptoms indicating need for more acute intervention. Patient verbalized understanding and all questions were answered. Patient received an After-Visit Summary.    Orders Placed This Encounter  Procedures   MR Brain W Wo Contrast   TSH   Vitamin B12   Vitamin D (25 hydroxy)   RPR   Multiple Myeloma Panel (SPEP&IFE w/QIG)   PTH, Intact and Calcium   Urinalysis w microscopic + reflex cultur   No orders of the defined types were placed in this encounter.  Referral Orders  No referral(s) requested today     Note is dictated utilizing voice recognition software. Although note has been proof read prior to signing, occasional typographical errors still  can be missed. If any questions arise, please do not hesitate to call for verification.   electronically signed by:  Howard Pouch, DO  [place]

## 2021-03-07 NOTE — Patient Instructions (Signed)
Dementia Dementia is a condition that affects the way the brain functions. It often affects memory and thinking. Usually, dementia gets worse with time and cannot be reversed (progressive dementia). There are many types of dementia, including: Alzheimer's disease. This type is the most common. Vascular dementia. This type may happen as the result of a stroke. Lewy body dementia. This type may happen to people who have Parkinson's disease. Frontotemporal dementia. This type is caused by damage to nerve cells (neurons) in certain parts of the brain. Some people may be affected by more than one type of dementia. This is called mixed dementia. What are the causes? Dementia is caused by damage to cells in the brain. The area of the brain and the types of cells damaged determine the type of dementia. Usually, this damage is irreversible or cannot be undone. Some examples of irreversible causes include: Conditions that affect the blood vessels of the brain, such as diabetes, heart disease, or blood vessel disease. Genetic mutations. In some cases, changes in the brain may be caused by another condition and can be reversed or slowed. Some examples of reversible causes include: Injury to the brain. Certain medicines. Infection, such as meningitis. Metabolic problems, such as vitamin B12 deficiency or thyroid disease. Pressure on the brain, such as from a tumor, blood clot, or too much fluid in the brain (hydrocephalus). Autoimmune diseases that affect the brain or arteries, such as limbic encephalitis or vasculitis. What are the signs or symptoms? Symptoms of dementia depend on the type of dementia. Common signs of dementia include problems with remembering, thinking, problem solving, decision making, and communicating. These signs develop slowly or get worse with time. This may include: Problems remembering events or people. Having trouble taking a bath or putting clothes on. Forgetting appointments or  forgetting to pay bills. Difficulty planning and preparing meals. Having trouble speaking. Getting lost easily. Changes in behavior or mood. How is this diagnosed? This condition is diagnosed by a specialist (neurologist). It is diagnosed based on the history of your symptoms, your medical history, a physical exam, and tests. Tests may include: Tests to evaluate brain function, such as memory tests, cognitive tests, and other tests. Lab tests, such as blood or urine tests. Imaging tests, such as a CT scan, a PET scan, or an MRI. Genetic testing. This may be done if other family members have a diagnosis of certain types of dementia. Your health care provider will talk with you and your family, friends, or caregivers about your history and symptoms. How is this treated? Treatment for this condition depends on the cause of the dementia. Progressive dementias, such as Alzheimer's disease, cannot be cured, but there may be treatments that help to manage symptoms. Treatment might involve taking medicines that may help to: Control the dementia. Slow down the progression of the dementia. Manage symptoms. In some cases, treating the cause of your dementia can improve symptoms, reverse symptoms, or slow down how quickly your dementia becomes worse. Your health care provider can direct you to support groups, organizations, and other health care providers who can help with decisions about your care. Follow these instructions at home: Medicines Take over-the-counter and prescription medicines only as told by your health care provider. Use a pill organizer or pill reminder to help you manage your medicines. Avoid taking medicines that can affect thinking, such as pain medicines or sleeping medicines. Lifestyle Make healthy lifestyle choices. Be physically active as told by your health care provider. Do not use any  products that contain nicotine or tobacco, such as cigarettes, e-cigarettes, and chewing  tobacco. If you need help quitting, ask your health care provider. Do not drink alcohol. Practice stress-management techniques when you get stressed. Spend time with other people. Make sure to get quality sleep. These tips can help you get a good night's rest: Avoid napping during the day. Keep your sleeping area dark and cool. Avoid exercising during the few hours before you go to bed. Avoid caffeine products in the evening. Eating and drinking Drink enough fluid to keep your urine pale yellow. Eat a healthy diet. General instructions  Work with your health care provider to determine what you need help with and what your safety needs are. Talk with your health care provider about whether it is safe for you to drive. If you were given a bracelet that identifies you as a person with memory loss or tracks your location, make sure to wear it at all times. Work with your family to make important decisions, such as advance directives, medical power of attorney, or a living will. Keep all follow-up visits. This is important. Where to find more information Alzheimer's Association: [employer] National Institute on Aging: DVDEnthusiasts.nl World Health Organization: RoleLink.com.br Contact a health care provider if: You have any new or worsening symptoms. You have problems with choking or swallowing. Get help right away if: You feel depressed or sad, or feel that you want to harm yourself. Your family members become concerned for your safety. If you ever feel like you may hurt yourself or others, or have thoughts about taking your own life, get help right away. Go to your nearest emergency department or: Call your local emergency services (911 in the [place] Call a suicide crisis helpline, such as the [place] at 325-211-3061 or 988 in the [place]. This is open 24 hours a day in the [place] Text the Crisis Text Line at (628)274-3409 (in the [place].). Summary Dementia is a  condition that affects the way the brain functions. Dementia often affects memory and thinking. Usually, dementia gets worse with time and cannot be reversed (progressive dementia). Treatment for this condition depends on the cause of the dementia. Work with your health care provider to determine what you need help with and what your safety needs are. Your health care provider can direct you to support groups, organizations, and other health care providers who can help with decisions about your care. This information is not intended to replace advice given to you by your health care provider. Make sure you discuss any questions you have with your health care provider. Document Revised: 08/17/2020 Document Reviewed: 06/08/2019 Elsevier Patient Education  [place].

## 2021-03-08 ENCOUNTER — Other Ambulatory Visit: Payer: Self-pay | Admitting: Family Medicine

## 2021-03-08 DIAGNOSIS — E039 Hypothyroidism, unspecified: Secondary | ICD-10-CM

## 2021-03-08 LAB — PTH, INTACT AND CALCIUM
Calcium: 9.5 mg/dL (ref 8.6–10.4)
PTH: 18 pg/mL (ref 16–77)

## 2021-03-08 LAB — RPR: RPR Ser Ql: NONREACTIVE

## 2021-03-08 MED ORDER — LEVOTHYROXINE SODIUM 88 MCG PO TABS: 88.0000 ug | ORAL_TABLET | Freq: Every day | ORAL | 3 refills | Status: DC

## 2021-03-09 LAB — URINALYSIS W MICROSCOPIC + REFLEX CULTURE
Bacteria, UA: NONE SEEN /HPF
Bilirubin Urine: NEGATIVE
Glucose, UA: NEGATIVE
Hgb urine dipstick: NEGATIVE
Hyaline Cast: NONE SEEN /LPF
Ketones, ur: NEGATIVE
Nitrites, Initial: NEGATIVE
Protein, ur: NEGATIVE
Specific Gravity, Urine: 1.01 (ref 1.001–1.035)
Squamous Epithelial / HPF: NONE SEEN /HPF (ref <=5)
pH: 6 (ref 5.0–8.0)

## 2021-03-09 LAB — URINE CULTURE
MICRO NUMBER:: 12946364
Result:: NO GROWTH
SPECIMEN QUALITY:: ADEQUATE

## 2021-03-09 LAB — CULTURE INDICATED

## 2021-03-10 LAB — MULTIPLE MYELOMA PANEL, SERUM
Albumin SerPl Elph-Mcnc: 3.5 g/dL (ref 2.9–4.4)
Albumin/Glob SerPl: 1.3 (ref 0.7–1.7)
Alpha 1: 0.3 g/dL (ref 0.0–0.4)
Alpha2 Glob SerPl Elph-Mcnc: 0.7 g/dL (ref 0.4–1.0)
B-Globulin SerPl Elph-Mcnc: 1 g/dL (ref 0.7–1.3)
Gamma Glob SerPl Elph-Mcnc: 0.9 g/dL (ref 0.4–1.8)
Globulin, Total: 2.8 g/dL (ref 2.2–3.9)
IgA/Immunoglobulin A, Serum: 149 mg/dL (ref 64–422)
IgG (Immunoglobin G), Serum: 824 mg/dL (ref 586–1602)
IgM (Immunoglobulin M), Srm: 60 mg/dL (ref 26–217)
Total Protein: 6.3 g/dL (ref 6.0–8.5)

## 2021-03-23 DIAGNOSIS — H353123 Nonexudative age-related macular degeneration, left eye, advanced atrophic without subfoveal involvement: Secondary | ICD-10-CM | POA: Diagnosis not present

## 2021-03-23 DIAGNOSIS — H35423 Microcystoid degeneration of retina, bilateral: Secondary | ICD-10-CM | POA: Diagnosis not present

## 2021-03-23 DIAGNOSIS — H35052 Retinal neovascularization, unspecified, left eye: Secondary | ICD-10-CM | POA: Diagnosis not present

## 2021-03-23 DIAGNOSIS — H353112 Nonexudative age-related macular degeneration, right eye, intermediate dry stage: Secondary | ICD-10-CM | POA: Diagnosis not present

## 2021-03-29 ENCOUNTER — Ambulatory Visit (INDEPENDENT_AMBULATORY_CARE_PROVIDER_SITE_OTHER): Payer: Medicare HMO

## 2021-03-29 ENCOUNTER — Ambulatory Visit: Payer: Medicare HMO

## 2021-03-29 ENCOUNTER — Other Ambulatory Visit: Payer: Self-pay

## 2021-03-29 VITALS — BP 124/74 | HR 71 | Temp 98.2°F | Wt 139.8 lb

## 2021-03-29 DIAGNOSIS — Z0000 Encounter for general adult medical examination without abnormal findings: Secondary | ICD-10-CM

## 2021-03-29 NOTE — Progress Notes (Signed)
Subjective:   Denise Fuller is a 86 y.o. female who presents for Medicare Annual (Subsequent) preventive examination.  Review of Systems     Cardiac Risk Factors include: advanced age (>66mn, >>62women);dyslipidemia;hypertension     Objective:    Today's Vitals   03/29/21 0930  BP: 124/74  Pulse: 71  Temp: 98.2 F (36.8 C)  SpO2: 98%  Weight: 139 lb 12.8 oz (63.4 kg)   Body mass index is 24 kg/m.  Advanced Directives 03/29/2021 03/23/2020 08/28/2019 04/26/2017  Does Patient Have a Medical Advance Directive? Yes Yes Yes Yes  Type of Advance Directive Living will [place] will [place] will  Copy of [place] Chart? - No - copy requested - No - copy requested    Current Medications (verified) Outpatient Encounter Medications as of 03/29/2021  Medication Sig   aspirin EC 81 MG tablet Take 81 mg by mouth daily.   Cholecalciferol (VITAMIN D3) 1000 units CAPS Take 1 capsule by mouth daily. 2,000 units daily   Cyanocobalamin (B-12 COMPLIANCE INJECTION) 1000 MCG/ML KIT Inject 1,000 mcg as directed every 30 (thirty) days. 08/28/2019 Weekly   Flaxseed, Linseed, (FLAX SEED OIL PO) Take 1,300 mg by mouth 2 (two) times daily.   levothyroxine (SYNTHROID) 88 MCG tablet Take 1 tablet (88 mcg total) by mouth daily before breakfast.   lisinopril (ZESTRIL) 20 MG tablet Take 1 tablet (20 mg total) by mouth daily.   Multiple Vitamins-Minerals (MULTIPLE VITAMINS/WOMENS PO) Take 1 tablet by mouth daily. Unknown strength   Multiple Vitamins-Minerals (PRESERVISION AREDS PO) Take by mouth.   Polyethyl Glycol-Propyl Glycol (SYSTANE OP) Apply 1 drop to eye as needed (Eye dryness).   [DISCONTINUED] calcium citrate-vitamin D (CITRACAL+D) 315-200 MG-UNIT tablet Take 1 tablet by mouth 2 (two) times daily.    [DISCONTINUED] co-enzyme Q-10 30 MG capsule Take 30 mg by mouth 3 (three) times  daily.    [DISCONTINUED] DHA-EPA-Vitamin E (OMEGA-3 COMPLEX PO) Take 360 mg by mouth daily.   No facility-administered encounter medications on file as of 03/29/2021.    Allergies (verified) Doxycycline, Keflex [cephalexin], and Iron   History: Past Medical History:  Diagnosis Date   Abnormal SPEP 07/16/2019   B12 deficiency 07/16/2019   Bartonella infection    BCC (basal cell carcinoma of skin) 1997   nose   Chickenpox    Chronic bilateral low back pain without sciatica 06/23/2019   Chronic venous insufficiency    Diverticulosis    Dyspnea on exertion 07/10/2019   Elevated alkaline phosphatase level    Elevated LDH    Elevated serum immunoglobulin free light chains 07/21/2019   Environmental allergies    GAD (generalized anxiety disorder)    Goals of care, counseling/discussion 08/28/2019   Groin abscess    culture negative, doxy resolved abscess   Hepatitis A    Hyperlipidemia    Hypertension    Hypothyroidism    Ingrown nail 04/10/2018   Lightheadedness 12/03/2018   Lymphadenopathy, cervical    chronic per pt. (left)   Macular degeneration    Meniere disease    MGUS (monoclonal gammopathy of unknown significance) 08/28/2019   Other fatigue 12/03/2018   Overweight (BMI 25.0-29.9) 04/10/2018   Palpitations 12/03/2018   Thyroid disease    Weakness generalized 12/03/2018   Past Surgical History:  Procedure Laterality Date   ABDOMINAL HYSTERECTOMY  1[place]  nose   BLEPHAROPLASTY     CATARACT EXTRACTION W/ INTRAOCULAR LENS IMPLANT Bilateral Oct and Nov 2018   CHOLECYSTECTOMY  1967   ORIF WRIST FRACTURE Left 05/18/2020   Family History  Problem Relation Age of Onset   Arthritis Mother    Heart disease Mother    Arthritis Father    Aneurysm Father 28       brain   Alzheimer's disease Father    Thyroid disease Daughter    Stroke Daughter        2020   Atrial fibrillation Son        2020   Social History    Socioeconomic History   Marital status: Married    Spouse name: [name]   Number of children: 3   Years of education: Not on file   Highest education level: Not on file  Occupational History   Occupation: retired  Tobacco Use   Smoking status: Never   Smokeless tobacco: Never  Vaping Use   Vaping Use: Never used  Substance and Sexual Activity   Alcohol use: No   Drug use: No   Sexual activity: Yes    Partners: Male    Comment: married  Other Topics Concern   Not on file  Social History Narrative   Retired. Married to [place] . They have 3 children [name])   [place] from [place] 05/2014.    Has 2 older dogs.    Retired.    Drinks caffeine, takes herbal remedies and dialy vitamin.   Wears her seatbelt, smoke detector in the home   Wears dentures, no assistive devices for walking - independent.    Feels safe in her relationships.    Social Determinants of Health   Financial Resource Strain: Low Risk    Difficulty of Paying Living Expenses: Not hard at all  Food Insecurity: No Food Insecurity   Worried About Charity fundraiser in the Last Year: Never true   [place] in the Last Year: Never true  Transportation Needs: No Transportation Needs   Lack of Transportation (Medical): No   Lack of Transportation (Non-Medical): No  Physical Activity: Insufficiently Active   Days of Exercise per Week: 6 days   Minutes of Exercise per Session: 10 min  Stress: No Stress Concern Present   Feeling of Stress : Not at all  Social Connections: Moderately Isolated   Frequency of Communication with Friends and Family: More than three times a week   Frequency of Social Gatherings with Friends and Family: More than three times a week   Attends Religious Services: Never   Marine scientist or Organizations: No   Attends Music therapist: Never   Marital Status: Married    Tobacco Counseling Counseling given: Not Answered   Clinical  Intake:  Pre-visit preparation completed: Yes  Pain : No/denies pain     BMI - recorded: 24 Nutritional Status: BMI of 19-24  Normal Nutritional Risks: None Diabetes: No  How often do you need to have someone help you when you read instructions, pamphlets, or other written materials from your doctor or pharmacy?: 1 - Never  Diabetic?No  Interpreter Needed?: No  Information entered by :: Charlott Rakes, LPN   Activities of Daily Living In your present state of health, do you have any difficulty performing the following activities: 03/29/2021  Hearing? Y  Comment slight loss  Vision? Y  Comment macular degenerative  Difficulty concentrating or making decisions? N  Walking or climbing stairs? N  Dressing or bathing? N  Doing errands, shopping? N  Preparing Food and eating ? N  Using the Toilet? N  In the past six months, have you accidently leaked urine? N  Do you have problems with loss of bowel control? N  Managing your Medications? N  Managing your Finances? N  Housekeeping or managing your Housekeeping? N  Some recent data might be hidden    Patient Care Team: Ma Hillock, DO as PCP - General (Family Medicine) Otelia Sergeant, OD as Referring Physician [place] (Dentistry)  Indicate any recent Medical Services you may have received from other than [facility] providers in the past year (date may be approximate).     Assessment:   This is a routine wellness examination for Joslynn.  Hearing/Vision screen Hearing Screening - Comments:: Pt stated slight loss Vision Screening - Comments:: Pt follows up with Dr Oswaldo Conroy for annual eye exams   Dietary issues and exercise activities discussed: Current Exercise Habits: Home exercise routine, Type of exercise: Other - see comments, Time (Minutes): 20, Frequency (Times/Week): 6, Weekly Exercise (Minutes/Week): 120   Goals Addressed             This Visit's Progress    Patient Stated       None at this time         Depression Screen PHQ 2/9 Scores 03/29/2021 02/10/2021 03/23/2020 10/15/2018 04/10/2018 04/26/2017 04/23/2016  PHQ - 2 Score 0 0 0 0 0 0 0  PHQ- 9 Score - - - - 0 - -    Fall Risk Fall Risk  03/29/2021 02/10/2021 03/23/2020 10/15/2018 04/26/2017  Falls in the past year? 0 1 0 1 Yes  Comment - - - - "fell", landed on furniture  Number falls in past yr: 0 0 0 0 1  Injury with Fall? 0 0 0 0 No  Risk for fall due to : Impaired vision;Impaired balance/gait Impaired vision - History of fall(s);Impaired vision;Medication side effect Impaired balance/gait  Risk for fall due to: Comment at times balance - - - -  Follow up Falls prevention discussed Falls evaluation completed Falls prevention discussed Falls evaluation completed;Education provided;Falls prevention discussed Falls prevention discussed    FALL RISK PREVENTION PERTAINING TO THE HOME:  Any stairs in or around the home? Yes  If so, are there any without handrails? No  Home free of loose throw rugs in walkways, pet beds, electrical cords, etc? Yes  Adequate lighting in your home to reduce risk of falls? Yes   ASSISTIVE DEVICES UTILIZED TO PREVENT FALLS:  Life alert? No  Use of a cane, walker or w/c? No  Grab bars in the bathroom? Yes  Shower chair or bench in shower? Yes  Elevated toilet seat or a handicapped toilet? No   TIMED UP AND GO:  Was the test performed? Yes .  Length of time to ambulate 10 feet: 15 sec.   Gait slow and steady without use of assistive device  Cognitive Function:     6CIT Screen 03/29/2021  What Year? 0 points  What month? 0 points  What time? 0 points  Count back from 20 0 points  Months in reverse 4 points  Repeat phrase 6 points  Total Score 10    Immunizations Immunization History  Administered Date(s) Administered   Fluad Quad(high Dose 65+) 10/15/2018, 11/02/2019, 10/31/2020   Influenza, High Dose Seasonal PF 01/10/2016, 11/08/2016, 11/27/2017   Influenza-Unspecified 10/27/2014,  11/08/2016, 05/17/2020  PFIZER(Purple Top)SARS-COV-2 Vaccination 04/20/2019, 05/25/2019, 12/03/2019   Pneumococcal Conjugate-13 10/27/2014   Pneumococcal-Unspecified 05/17/2020   Tdap 05/17/2020   Zoster Recombinat (Shingrix) 09/20/2017, 01/05/2018    TDAP status: Up to date  Flu Vaccine status: Up to date  Pneumococcal vaccine status: Up to date  Covid-19 vaccine status: Completed vaccines  Qualifies for Shingles Vaccine? Yes   Zostavax completed Yes   Shingrix Completed?: Yes  Screening Tests Health Maintenance  Topic Date Due   Pneumonia Vaccine 49+ Years old (2 - PPSV23 if available, else PCV20) 10/27/2015   COVID-19 Vaccine (4 - Booster for Pfizer series) 01/28/2020   TETANUS/TDAP  05/18/2030   INFLUENZA VACCINE  Completed   DEXA SCAN  Completed   Zoster Vaccines- Shingrix  Completed   HPV VACCINES  Aged Out    Health Maintenance  Health Maintenance Due  Topic Date Due   Pneumonia Vaccine 4+ Years old (2 - PPSV23 if available, else PCV20) 10/27/2015   COVID-19 Vaccine (4 - Booster for Sanders series) 01/28/2020    Colorectal cancer screening: No longer required.   Mammogram status: No longer required due to age.     Additional Screening:   Vision Screening: Recommended annual ophthalmology exams for early detection of glaucoma and other disorders of the eye. Is the patient up to date with their annual eye exam?  Yes  Who is the provider or what is the name of the office in which the patient attends annual eye exams? Dr Oswaldo Conroy If pt is not established with a provider, would they like to be referred to a provider to establish care? No .   Dental Screening: Recommended annual dental exams for proper oral hygiene  Community Resource Referral / Chronic Care Management: CRR required this visit?  No   CCM required this visit?  No      Plan:     I have personally reviewed and noted the following in the patients chart:   Medical and social  history Use of alcohol, tobacco or illicit drugs  Current medications and supplements including opioid prescriptions.  Functional ability and status Nutritional status Physical activity Advanced directives List of other physicians Hospitalizations, surgeries, and ER visits in previous 12 months Vitals Screenings to include cognitive, depression, and falls Referrals and appointments  In addition, I have reviewed and discussed with patient certain preventive protocols, quality metrics, and best practice recommendations. A written personalized care plan for preventive services as well as general preventive health recommendations were provided to patient.     Willette Brace, LPN   [id number]   Nurse Notes: None

## 2021-03-29 NOTE — Patient Instructions (Signed)
Denise Fuller , Thank you for taking time to come for your Medicare Wellness Visit. I appreciate your ongoing commitment to your health goals. Please review the following plan we discussed and let me know if I can assist you in the future.   Screening recommendations/referrals: Colonoscopy: No longer required  Mammogram: No longer required  Bone Density: No longer required  Recommended yearly ophthalmology/optometry visit for glaucoma screening and checkup Recommended yearly dental visit for hygiene and checkup  Vaccinations: Influenza vaccine: Done 10/31/20 repeat every year  Pneumococcal vaccine: Up to date Tdap vaccine: Done 05/17/20 repeat every 10 years  Shingles vaccine: Completed 8/16, 01/05/18    Covid-19:Completed 3/15, 4/19, & 12/03/19  Advanced directives: Please bring a copy of your health care power of attorney and living will to the office at your convenience.   Conditions/risks identified: None at this time   Next appointment: Follow up in one year for your annual wellness visit    Preventive Care 65 Years and Older, Female Preventive care refers to lifestyle choices and visits with your health care provider that can promote health and wellness. What does preventive care include? A yearly physical exam. This is also called an annual well check. Dental exams once or twice a year. Routine eye exams. Ask your health care provider how often you should have your eyes checked. Personal lifestyle choices, including: Daily care of your teeth and gums. Regular physical activity. Eating a healthy diet. Avoiding tobacco and drug use. Limiting alcohol use. Practicing safe sex. Taking low-dose aspirin every day. Taking vitamin and mineral supplements as recommended by your health care provider. What happens during an annual well check? The services and screenings done by your health care provider during your annual well check will depend on your age, overall health, lifestyle risk  factors, and family history of disease. Counseling  Your health care provider may ask you questions about your: Alcohol use. Tobacco use. Drug use. Emotional well-being. Home and relationship well-being. Sexual activity. Eating habits. History of falls. Memory and ability to understand (cognition). Work and work Statistician. Reproductive health. Screening  You may have the following tests or measurements: Height, weight, and BMI. Blood pressure. Lipid and cholesterol levels. These may be checked every 5 years, or more frequently if you are over 61 years old. Skin check. Lung cancer screening. You may have this screening every year starting at age 40 if you have a 30-pack-year history of smoking and currently smoke or have quit within the past 15 years. Fecal occult blood test (FOBT) of the stool. You may have this test every year starting at age 37. Flexible sigmoidoscopy or colonoscopy. You may have a sigmoidoscopy every 5 years or a colonoscopy every 10 years starting at age 65. Hepatitis C blood test. Hepatitis B blood test. Sexually transmitted disease (STD) testing. Diabetes screening. This is done by checking your blood sugar (glucose) after you have not eaten for a while (fasting). You may have this done every 1-3 years. Bone density scan. This is done to screen for osteoporosis. You may have this done starting at age 24. Mammogram. This may be done every 1-2 years. Talk to your health care provider about how often you should have regular mammograms. Talk with your health care provider about your test results, treatment options, and if necessary, the need for more tests. Vaccines  Your health care provider may recommend certain vaccines, such as: Influenza vaccine. This is recommended every year. Tetanus, diphtheria, and acellular pertussis (Tdap, Td) vaccine.  You may need a Td booster every 10 years. Zoster vaccine. You may need this after age 61. Pneumococcal 13-valent  conjugate (PCV13) vaccine. One dose is recommended after age 5. Pneumococcal polysaccharide (PPSV23) vaccine. One dose is recommended after age 29. Talk to your health care provider about which screenings and vaccines you need and how often you need them. This information is not intended to replace advice given to you by your health care provider. Make sure you discuss any questions you have with your health care provider. Document Released: 02/18/2015 Document Revised: 10/12/2015 Document Reviewed: 11/23/2014 Elsevier Interactive Patient Education  2017 [place] Prevention in the Home Falls can cause injuries. They can happen to people of all ages. There are many things you can do to make your home safe and to help prevent falls. What can I do on the outside of my home? Regularly fix the edges of walkways and driveways and fix any cracks. Remove anything that might make you trip as you walk through a door, such as a raised step or threshold. Trim any bushes or trees on the path to your home. Use bright outdoor lighting. Clear any walking paths of anything that might make someone trip, such as rocks or tools. Regularly check to see if handrails are loose or broken. Make sure that both sides of any steps have handrails. Any raised decks and porches should have guardrails on the edges. Have any leaves, snow, or ice cleared regularly. Use sand or salt on walking paths during winter. Clean up any spills in your garage right away. This includes oil or grease spills. What can I do in the bathroom? Use night lights. Install grab bars by the toilet and in the tub and shower. Do not use towel bars as grab bars. Use non-skid mats or decals in the tub or shower. If you need to sit down in the shower, use a plastic, non-slip stool. Keep the floor dry. Clean up any water that spills on the floor as soon as it happens. Remove soap buildup in the tub or shower regularly. Attach bath mats  securely with double-sided non-slip rug tape. Do not have throw rugs and other things on the floor that can make you trip. What can I do in the bedroom? Use night lights. Make sure that you have a light by your bed that is easy to reach. Do not use any sheets or blankets that are too big for your bed. They should not hang down onto the floor. Have a firm chair that has side arms. You can use this for support while you get dressed. Do not have throw rugs and other things on the floor that can make you trip. What can I do in the kitchen? Clean up any spills right away. Avoid walking on wet floors. Keep items that you use a lot in easy-to-reach places. If you need to reach something above you, use a strong step stool that has a grab bar. Keep electrical cords out of the way. Do not use floor polish or wax that makes floors slippery. If you must use wax, use non-skid floor wax. Do not have throw rugs and other things on the floor that can make you trip. What can I do with my stairs? Do not leave any items on the stairs. Make sure that there are handrails on both sides of the stairs and use them. Fix handrails that are broken or loose. Make sure that handrails are as long as  the stairways. Check any carpeting to make sure that it is firmly attached to the stairs. Fix any carpet that is loose or worn. Avoid having throw rugs at the top or bottom of the stairs. If you do have throw rugs, attach them to the floor with carpet tape. Make sure that you have a light switch at the top of the stairs and the bottom of the stairs. If you do not have them, ask someone to add them for you. What else can I do to help prevent falls? Wear shoes that: Do not have high heels. Have rubber bottoms. Are comfortable and fit you well. Are closed at the toe. Do not wear sandals. If you use a stepladder: Make sure that it is fully opened. Do not climb a closed stepladder. Make sure that both sides of the stepladder  are locked into place. Ask someone to hold it for you, if possible. Clearly mark and make sure that you can see: Any grab bars or handrails. First and last steps. Where the edge of each step is. Use tools that help you move around (mobility aids) if they are needed. These include: Canes. Walkers. Scooters. Crutches. Turn on the lights when you go into a dark area. Replace any light bulbs as soon as they burn out. Set up your furniture so you have a clear path. Avoid moving your furniture around. If any of your floors are uneven, fix them. If there are any pets around you, be aware of where they are. Review your medicines with your doctor. Some medicines can make you feel dizzy. This can increase your chance of falling. Ask your doctor what other things that you can do to help prevent falls. This information is not intended to replace advice given to you by your health care provider. Make sure you discuss any questions you have with your health care provider. Document Released: 11/18/2008 Document Revised: 06/30/2015 Document Reviewed: 02/26/2014 Elsevier Interactive Patient Education  2017 [employer].

## 2021-04-12 ENCOUNTER — Ambulatory Visit: Payer: Medicare HMO | Admitting: Family Medicine

## 2021-04-21 ENCOUNTER — Ambulatory Visit (INDEPENDENT_AMBULATORY_CARE_PROVIDER_SITE_OTHER): Payer: Medicare HMO

## 2021-04-21 ENCOUNTER — Other Ambulatory Visit: Payer: Self-pay

## 2021-04-21 DIAGNOSIS — E538 Deficiency of other specified B group vitamins: Secondary | ICD-10-CM | POA: Diagnosis not present

## 2021-04-21 MED ORDER — CYANOCOBALAMIN 1000 MCG/ML IJ SOLN
1000.0000 ug | Freq: Once | INTRAMUSCULAR | Status: AC
  Administered 2021-04-21: 1000 ug via INTRAMUSCULAR

## 2021-04-21 NOTE — Progress Notes (Signed)
Per the orders of Dr. [name] pt is here for B12 injection. Pt received injection in left deltoid, pt tolerated injection well. Pt will return in one month for next injection.   

## 2021-05-30 ENCOUNTER — Other Ambulatory Visit: Payer: Self-pay | Admitting: Family Medicine

## 2021-05-30 DIAGNOSIS — I10 Essential (primary) hypertension: Secondary | ICD-10-CM

## 2021-05-30 DIAGNOSIS — E039 Hypothyroidism, unspecified: Secondary | ICD-10-CM

## 2021-06-29 ENCOUNTER — Ambulatory Visit (INDEPENDENT_AMBULATORY_CARE_PROVIDER_SITE_OTHER): Payer: Medicare HMO

## 2021-06-29 DIAGNOSIS — E538 Deficiency of other specified B group vitamins: Secondary | ICD-10-CM | POA: Diagnosis not present

## 2021-06-29 MED ORDER — CYANOCOBALAMIN 1000 MCG/ML IJ SOLN
1000.0000 ug | Freq: Once | INTRAMUSCULAR | Status: AC
  Administered 2021-06-29: 1000 ug via INTRAMUSCULAR

## 2021-06-29 NOTE — Progress Notes (Signed)
Per the orders of Dr. [name] pt is here for B12 injection. Pt received injection in left deltoid, pt tolerated injection well. Pt will return in one month for next injection.   

## 2021-06-30 DIAGNOSIS — N390 Urinary tract infection, site not specified: Secondary | ICD-10-CM | POA: Diagnosis not present

## 2021-07-13 DIAGNOSIS — H35052 Retinal neovascularization, unspecified, left eye: Secondary | ICD-10-CM | POA: Diagnosis not present

## 2021-07-13 DIAGNOSIS — H353112 Nonexudative age-related macular degeneration, right eye, intermediate dry stage: Secondary | ICD-10-CM | POA: Diagnosis not present

## 2021-07-13 DIAGNOSIS — H353123 Nonexudative age-related macular degeneration, left eye, advanced atrophic without subfoveal involvement: Secondary | ICD-10-CM | POA: Diagnosis not present

## 2021-07-13 DIAGNOSIS — H43813 Vitreous degeneration, bilateral: Secondary | ICD-10-CM | POA: Diagnosis not present

## 2021-08-16 ENCOUNTER — Ambulatory Visit (INDEPENDENT_AMBULATORY_CARE_PROVIDER_SITE_OTHER): Payer: Medicare HMO

## 2021-08-16 DIAGNOSIS — E538 Deficiency of other specified B group vitamins: Secondary | ICD-10-CM | POA: Diagnosis not present

## 2021-08-16 MED ORDER — CYANOCOBALAMIN 1000 MCG/ML IJ SOLN
1000.0000 ug | Freq: Once | INTRAMUSCULAR | Status: AC
  Administered 2021-08-16: 1000 ug via INTRAMUSCULAR

## 2021-08-16 NOTE — Progress Notes (Signed)
Per the orders of Dr. [name] pt is here for B12 injection. Pt received injection in left deltoid, pt tolerated injection well. Pt will return in one month for next injection.   

## 2021-08-25 ENCOUNTER — Other Ambulatory Visit: Payer: Self-pay | Admitting: Family Medicine

## 2021-09-20 ENCOUNTER — Other Ambulatory Visit: Payer: Self-pay | Admitting: Family Medicine

## 2021-09-28 DIAGNOSIS — B3731 Acute candidiasis of vulva and vagina: Secondary | ICD-10-CM | POA: Diagnosis not present

## 2021-10-03 ENCOUNTER — Other Ambulatory Visit: Payer: Self-pay | Admitting: Family Medicine

## 2021-10-03 DIAGNOSIS — I10 Essential (primary) hypertension: Secondary | ICD-10-CM

## 2021-10-04 ENCOUNTER — Telehealth: Payer: Self-pay | Admitting: Family Medicine

## 2021-10-04 MED ORDER — LISINOPRIL 20 MG PO TABS: 20.0000 mg | ORAL_TABLET | Freq: Every day | ORAL | 0 refills | Status: DC

## 2021-10-04 NOTE — Telephone Encounter (Signed)
Pt states that she does not have any more refills for Lisinopril 20 mg.

## 2021-10-04 NOTE — Telephone Encounter (Signed)
Rx sent. Appt sched

## 2021-10-05 ENCOUNTER — Ambulatory Visit (INDEPENDENT_AMBULATORY_CARE_PROVIDER_SITE_OTHER): Payer: Medicare HMO

## 2021-10-05 DIAGNOSIS — E538 Deficiency of other specified B group vitamins: Secondary | ICD-10-CM

## 2021-10-05 MED ORDER — CYANOCOBALAMIN 1000 MCG/ML IJ SOLN
1000.0000 ug | Freq: Once | INTRAMUSCULAR | Status: AC
  Administered 2021-10-05: 1000 ug via INTRAMUSCULAR

## 2021-10-05 NOTE — Progress Notes (Signed)
Per the orders of Dr. [name] pt is here for B12 injection. Pt received injection in left deltoid, pt tolerated injection well. Pt will return in one month for next injection.   

## 2021-10-12 ENCOUNTER — Encounter: Payer: Self-pay | Admitting: Family Medicine

## 2021-10-12 ENCOUNTER — Ambulatory Visit (INDEPENDENT_AMBULATORY_CARE_PROVIDER_SITE_OTHER): Payer: Medicare HMO | Admitting: Family Medicine

## 2021-10-12 VITALS — BP 131/82 | HR 74 | Temp 98.2°F | Ht 64.0 in | Wt 140.0 lb

## 2021-10-12 DIAGNOSIS — E782 Mixed hyperlipidemia: Secondary | ICD-10-CM | POA: Diagnosis not present

## 2021-10-12 DIAGNOSIS — I10 Essential (primary) hypertension: Secondary | ICD-10-CM | POA: Diagnosis not present

## 2021-10-12 DIAGNOSIS — N1831 Chronic kidney disease, stage 3a: Secondary | ICD-10-CM | POA: Diagnosis not present

## 2021-10-12 DIAGNOSIS — E538 Deficiency of other specified B group vitamins: Secondary | ICD-10-CM

## 2021-10-12 DIAGNOSIS — E034 Atrophy of thyroid (acquired): Secondary | ICD-10-CM | POA: Diagnosis not present

## 2021-10-12 DIAGNOSIS — R768 Other specified abnormal immunological findings in serum: Secondary | ICD-10-CM

## 2021-10-12 DIAGNOSIS — Z23 Encounter for immunization: Secondary | ICD-10-CM | POA: Diagnosis not present

## 2021-10-12 DIAGNOSIS — D472 Monoclonal gammopathy: Secondary | ICD-10-CM

## 2021-10-12 LAB — CBC WITH DIFFERENTIAL/PLATELET
Basophils Absolute: 0.2 10*3/uL — ABNORMAL HIGH (ref 0.0–0.1)
Basophils Relative: 3.1 % — ABNORMAL HIGH (ref 0.0–3.0)
Eosinophils Absolute: 0.2 10*3/uL (ref 0.0–0.7)
Eosinophils Relative: 2.8 % (ref 0.0–5.0)
HCT: 39.4 % (ref 36.0–46.0)
Hemoglobin: 12.8 g/dL (ref 12.0–15.0)
Lymphocytes Relative: 36 % (ref 12.0–46.0)
Lymphs Abs: 2.1 10*3/uL (ref 0.7–4.0)
MCHC: 32.6 g/dL (ref 30.0–36.0)
MCV: 91.2 fl (ref 78.0–100.0)
Monocytes Absolute: 0.7 10*3/uL (ref 0.1–1.0)
Monocytes Relative: 11.4 % (ref 3.0–12.0)
Neutro Abs: 2.7 10*3/uL (ref 1.4–7.7)
Neutrophils Relative %: 46.7 % (ref 43.0–77.0)
Platelets: 251 10*3/uL (ref 150.0–400.0)
RBC: 4.32 Mil/uL (ref 3.87–5.11)
RDW: 14.9 % (ref 11.5–15.5)
WBC: 5.7 10*3/uL (ref 4.0–10.5)

## 2021-10-12 LAB — TSH: TSH: 3.77 u[IU]/mL (ref 0.35–5.50)

## 2021-10-12 LAB — BASIC METABOLIC PANEL
BUN: 13 mg/dL (ref 6–23)
CO2: 29 mEq/L (ref 19–32)
Calcium: 9 mg/dL (ref 8.4–10.5)
Chloride: 106 mEq/L (ref 96–112)
Creatinine, Ser: 0.71 mg/dL (ref 0.40–1.20)
GFR: 78.4 mL/min (ref >60.00)
Glucose, Bld: 86 mg/dL (ref 70–99)
Potassium: 3.7 mEq/L (ref 3.5–5.1)
Sodium: 141 mEq/L (ref 135–145)

## 2021-10-12 LAB — T3, FREE: T3, Free: 2.4 pg/mL (ref 2.3–4.2)

## 2021-10-12 LAB — T4, FREE: Free T4: 0.92 ng/dL (ref 0.60–1.60)

## 2021-10-12 LAB — LIPID PANEL
Cholesterol: 199 mg/dL (ref 0–200)
HDL: 57.6 mg/dL (ref >39.00)
LDL Cholesterol: 118 mg/dL — ABNORMAL HIGH (ref 0–99)
NonHDL: 141.16
Total CHOL/HDL Ratio: 3
Triglycerides: 117 mg/dL (ref 0.0–149.0)
VLDL: 23.4 mg/dL (ref 0.0–40.0)

## 2021-10-12 NOTE — Patient Instructions (Addendum)
Return in about 24 weeks (around 03/29/2022) for Routine chronic condition follow-up.        Great to see you today.  I have refilled the medication(s) we provide.   If labs were collected, we will inform you of lab results once received either by echart message or telephone call.   - echart message- for normal results that have been seen by the patient already.   - telephone call: abnormal results or if patient has not viewed results in their echart.

## 2021-10-12 NOTE — Progress Notes (Signed)
Denise Fuller , [birth year], 86 y.o., female MRN: [id number] Patient Care Team    Relationship Specialty Notifications Start End  Ma Hillock, DO PCP - General Family Medicine  01/10/16   Otelia Sergeant, OD Referring Physician   01/10/16    Comment: ophth- macular degeneration  [facility]  04/26/17     Chief Complaint  Patient presents with   Hypertension    Cmc; pt is fasting     Subjective: Denise Fuller is a 86 y.o. female present for  Essential hypertension/HLD Pt reports compliance with lisinopril 20  mg a day. Patient denies chest pain, shortness of breath, dizziness or lower extremity edema.  Pt takesdaily baby ASA.   Cognitive decline/b12 def: She has received B12 injections as indicated and has noticed an improvement.  Her thyroid was mildly oversupplemented at that time as well. Pt presents for an OV with her daughter today to discuss cognitive changes that have been noticed over the last year. Pt reports she has noticed she is more forgetful of appts and details of conversations. Her daughter reports the family noticed changes a little of a year ago. Started with forgetting having conversations. Daughter reports last couple of months there has been changes in her cooking, forgetting appts, even forgetting she wrote down appts, more details of conversations. When she is told she forgot she has started to become more "elevated." She has h/o b12 def and thyroid disorder. She reports compliance with levothyroxine and monthly b12 inj.  She has had a h/o of abnormal spep, which has been stable. No h/o  stroke, but endorses today her eye doctor told her she was having "mini-strokes of her eye." She takes a baby ASA and BP is well controlled on medication.      03/29/2021    9:44 AM 02/10/2021    4:07 PM 03/23/2020    9:03 AM 10/15/2018    9:42 AM 04/10/2018   10:07 AM  Depression screen PHQ 2/9  Decreased Interest 0 0 0 0 0  Down, Depressed, Hopeless 0 0 0 0 0  PHQ  - 2 Score 0 0 0 0 0  Altered sleeping     0  Tired, decreased energy     0  Change in appetite     0  Feeling bad or failure about yourself      0  Trouble concentrating     0  Moving slowly or fidgety/restless     0  Suicidal thoughts     0  PHQ-9 Score     0  Difficult doing work/chores     Not difficult at all    Allergies  Allergen Reactions   Doxycycline     GI upset   Keflex [Cephalexin] Hives and Itching   Iron Rash   Social History   Social History Narrative   Retired. Married to Denise Fuller. They have 3 children [name])   [place] from [place] 05/2014.    Has 2 older dogs.    Retired.    Drinks caffeine, takes herbal remedies and dialy vitamin.   Wears her seatbelt, smoke detector in the home   Wears dentures, no assistive devices for walking - independent.    Feels safe in her relationships.    Past Medical History:  Diagnosis Date   Abnormal SPEP 07/16/2019   B12 deficiency 07/16/2019   Bartonella infection    BCC (basal cell carcinoma of skin) 1997  nose   Chickenpox    Chronic bilateral low back pain without sciatica 06/23/2019   Chronic venous insufficiency    Diverticulosis    Dyspnea on exertion 07/10/2019   Elevated alkaline phosphatase level    Elevated LDH    Elevated serum immunoglobulin free light chains 07/21/2019   Environmental allergies    GAD (generalized anxiety disorder)    Goals of care, counseling/discussion 08/28/2019   Groin abscess    culture negative, doxy resolved abscess   Hepatitis A    Hyperlipidemia    Hypertension    Hypothyroidism    Ingrown nail 04/10/2018   Lightheadedness 12/03/2018   Lymphadenopathy, cervical    chronic per pt. (left)   Macular degeneration    Meniere disease    MGUS (monoclonal gammopathy of unknown significance) 08/28/2019   Other fatigue 12/03/2018   Overweight (BMI 25.0-29.9) 04/10/2018   Palpitations 12/03/2018   Thyroid disease    Weakness generalized 12/03/2018   Past Surgical History:   Procedure Laterality Date   ABDOMINAL HYSTERECTOMY  1991   APPENDECTOMY  1967   BASAL CELL CARCINOMA EXCISION  1997   nose   BLEPHAROPLASTY     CATARACT EXTRACTION W/ INTRAOCULAR LENS IMPLANT Bilateral Oct and Nov 2018   CHOLECYSTECTOMY  1967   ORIF WRIST FRACTURE Left 05/18/2020   Family History  Problem Relation Age of Onset   Arthritis Mother    Heart disease Mother    Arthritis Father    Aneurysm Father 42       brain   Alzheimer's disease Father    Thyroid disease Daughter    Stroke Daughter        2020   Atrial fibrillation Son        2020    All past medical history, surgical history, allergies, family history, immunizations andmedications were updated in the EMR today and reviewed under the history and medication portions of their EMR.     ROS: Negative, with the exception of above mentioned in HPI   Objective:  BP 131/82   Pulse 74   Temp 98.2 F (36.8 C) (Oral)   Ht '5\' 4"'$  (1.626 m)   Wt 140 lb (63.5 kg)   SpO2 98%   BMI 24.03 kg/m  Body mass index is 24.03 kg/m. Physical Exam Vitals and nursing note reviewed.  Constitutional:      General: She is not in acute distress.    Appearance: Normal appearance. She is not ill-appearing, toxic-appearing or diaphoretic.  HENT:     Head: Normocephalic and atraumatic.     Mouth/Throat:     Mouth: Mucous membranes are moist.  Eyes:     General: No scleral icterus.       Right eye: No discharge.        Left eye: No discharge.     Extraocular Movements: Extraocular movements intact.     Conjunctiva/sclera: Conjunctivae normal.     Pupils: Pupils are equal, round, and reactive to light.  Cardiovascular:     Rate and Rhythm: Normal rate and regular rhythm.  Pulmonary:     Effort: Pulmonary effort is normal. No respiratory distress.     Breath sounds: Normal breath sounds. No wheezing, rhonchi or rales.  Musculoskeletal:     Cervical back: Neck supple. No tenderness.     Right lower leg: No edema.     Left  lower leg: No edema.  Lymphadenopathy:     Cervical: No cervical adenopathy.  Skin:  General: Skin is warm and dry.     Coloration: Skin is not jaundiced or pale.     Findings: No erythema or rash.  Neurological:     Mental Status: She is alert and oriented to person, place, and time. Mental status is at baseline.     Motor: No weakness.     Gait: Gait normal.  Psychiatric:        Mood and Affect: Mood normal.        Behavior: Behavior normal.        Thought Content: Thought content normal.        Judgment: Judgment normal.      No results found. No results found. No results found for this or any previous visit (from the past 24 hour(s)).   Assessment/Plan: OLINA MELFI is a 86 y.o. female present for OV  HTN/Mixed hyperlipidemia stable  Dc dyazide and use only if swelling is appreciated.  Continue lisinopril 20 mg a day. Do not recommend monitoring any further at home unless dizziness occurs. Anxiety is driving up home pressures.  - Basic Metabolic Panel (BMET) - CBC w/Diff - Lipid panel  Hypothyroidism due to acquired atrophy of thyroid Continue levothyroxine 88 mcg daily.  Medication will be refilled and appropriate dose after results are received. - TSH - T3, free - T4, free  B12 deficiency Continue B12 injections indefinitely according to schedule.  MGUS (monoclonal gammopathy of unknown significance)/Elevated serum immunoglobulin free light chains/Stage 3a chronic kidney disease (HCC) -Routine SPEP, BMP every 6 months.  If in the M spike occurs again then will refer to oncology at that time (per onc). - Protein,Total and Electrophor w/IFE-([facility])  Need for influenza vaccination - Flu Vaccine QUAD High Dose(Fluad)     Reviewed expectations re: course of current medical issues. Discussed self-management of symptoms. Outlined signs and symptoms indicating need for more acute intervention. Patient verbalized understanding and all questions were  answered. Patient received an After-Visit Summary.   Orders Placed This Encounter  Procedures   Flu Vaccine QUAD High Dose(Fluad)   Basic Metabolic Panel (BMET)   Protein,Total and Electrophor w/IFE-([facility])   CBC w/Diff   Lipid panel   TSH   T3, free   T4, free   No orders of the defined types were placed in this encounter.   Referral Orders  No referral(s) requested today     Note is dictated utilizing voice recognition software. Although note has been proof read prior to signing, occasional typographical errors still can be missed. If any questions arise, please do not hesitate to call for verification.   electronically signed by:  Howard Pouch, DO  [facility] [facility]

## 2021-10-13 ENCOUNTER — Telehealth: Payer: Self-pay | Admitting: Family Medicine

## 2021-10-13 DIAGNOSIS — E039 Hypothyroidism, unspecified: Secondary | ICD-10-CM

## 2021-10-13 MED ORDER — LEVOTHYROXINE SODIUM 88 MCG PO TABS: 88.0000 ug | ORAL_TABLET | Freq: Every day | ORAL | 3 refills | Status: DC

## 2021-10-13 NOTE — Telephone Encounter (Signed)
Please inform patient Kidney function is normal Cholesterol panel looks great Blood cell counts are normal Thyroid panel is normal.  I have refilled her thyroid medication for her at the same dose.

## 2021-10-13 NOTE — Telephone Encounter (Signed)
Spoke with pt regarding labs and instructions.   

## 2021-10-17 ENCOUNTER — Telehealth: Payer: Self-pay | Admitting: Family Medicine

## 2021-10-17 LAB — PROTEIN,TOTAL AND ELECTROPHOR W/IFE
Albumin ELP: 3.6 g/dL — ABNORMAL LOW (ref 3.8–4.8)
Alpha 1: 0.3 g/dL (ref 0.2–0.3)
Alpha 2: 0.6 g/dL (ref 0.5–0.9)
Beta 2: 0.3 g/dL (ref 0.2–0.5)
Beta Globulin: 0.4 g/dL (ref 0.4–0.6)
Gamma Globulin: 0.8 g/dL (ref 0.8–1.7)
Immunofix Electr Int: NOT DETECTED
Total Protein: 6.1 g/dL (ref 6.1–8.1)

## 2021-10-17 NOTE — Telephone Encounter (Signed)
Spoke with pt regarding labs and instructions.   

## 2021-10-17 NOTE — Telephone Encounter (Signed)
Please inform patient: The last lab that we check every 6 months called SPEP, is normal.

## 2021-10-27 ENCOUNTER — Other Ambulatory Visit: Payer: Self-pay | Admitting: Family Medicine

## 2021-11-19 ENCOUNTER — Other Ambulatory Visit: Payer: Self-pay | Admitting: Family Medicine

## 2021-12-14 ENCOUNTER — Ambulatory Visit (INDEPENDENT_AMBULATORY_CARE_PROVIDER_SITE_OTHER): Payer: Medicare HMO

## 2021-12-14 DIAGNOSIS — E538 Deficiency of other specified B group vitamins: Secondary | ICD-10-CM

## 2021-12-14 MED ORDER — CYANOCOBALAMIN 1000 MCG/ML IJ SOLN
1000.0000 ug | Freq: Once | INTRAMUSCULAR | Status: AC
  Administered 2021-12-14: 1000 ug via INTRAMUSCULAR

## 2021-12-15 NOTE — Progress Notes (Signed)
Per the orders of Dr. Raoul Pitch pt is here for B12 injection. Pt received injection in left deltoid, pt tolerated injection well. Pt will return in one month for next injection.

## 2022-02-13 IMAGING — DX DG LUMBAR SPINE COMPLETE 4+V
5 series · 5 of 5 positions shown · non-contrast
Comparison: None.

CLINICAL DATA: Chronic lumbar pain

EXAM:
LUMBAR SPINE - COMPLETE 4+ VIEW

[l-spine ap]
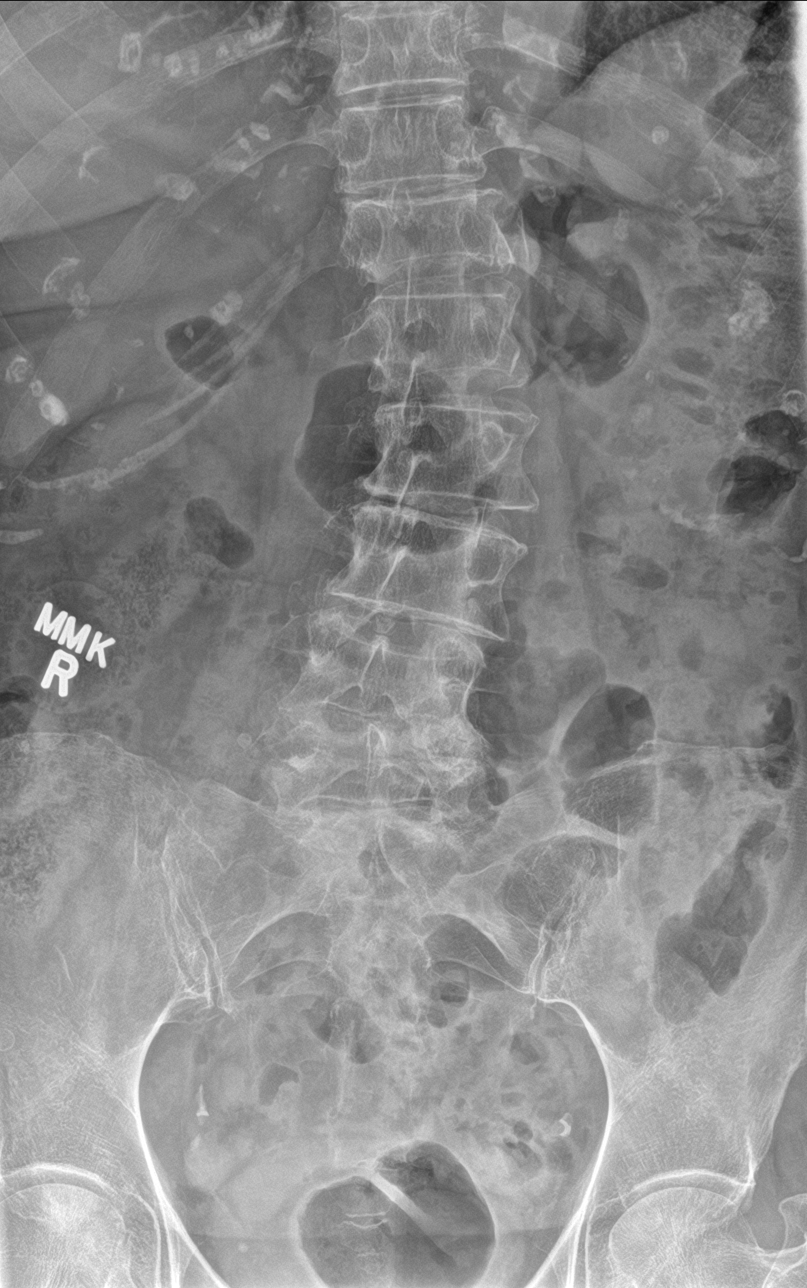

[l-spine obl (1 of 2)]
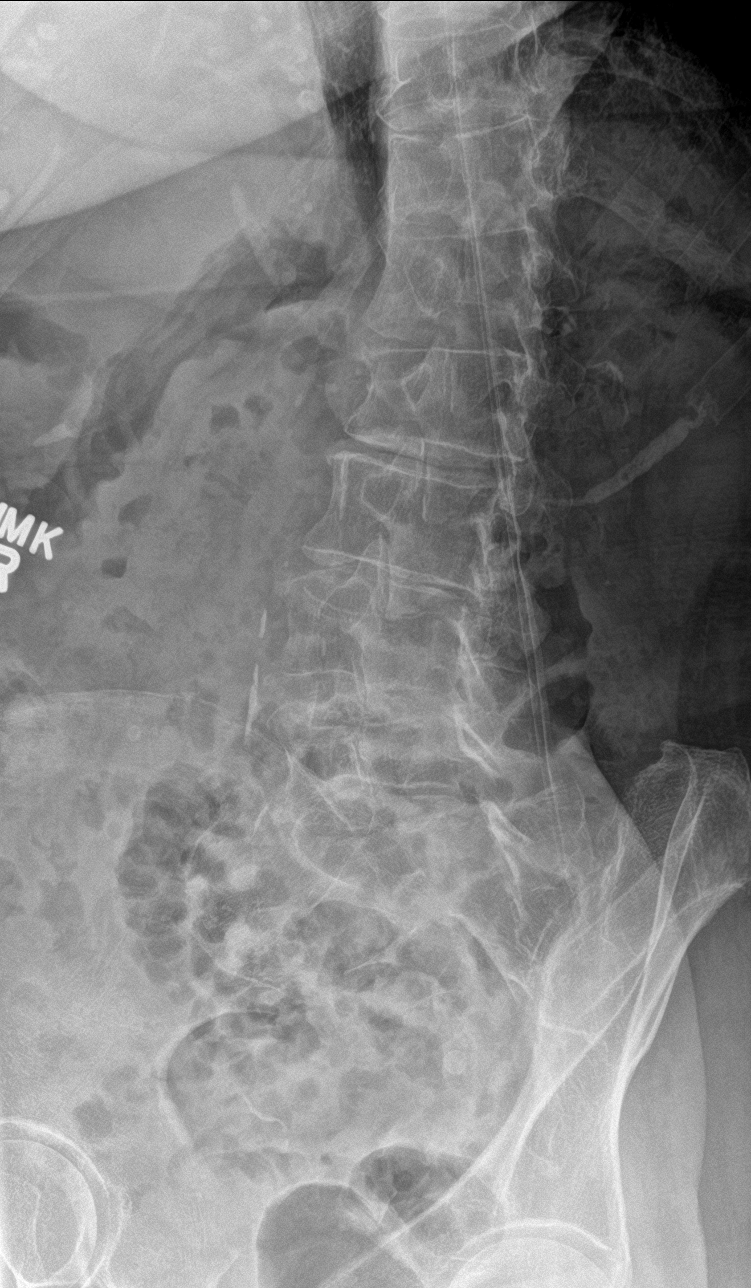

[l-spine obl (2 of 2)]
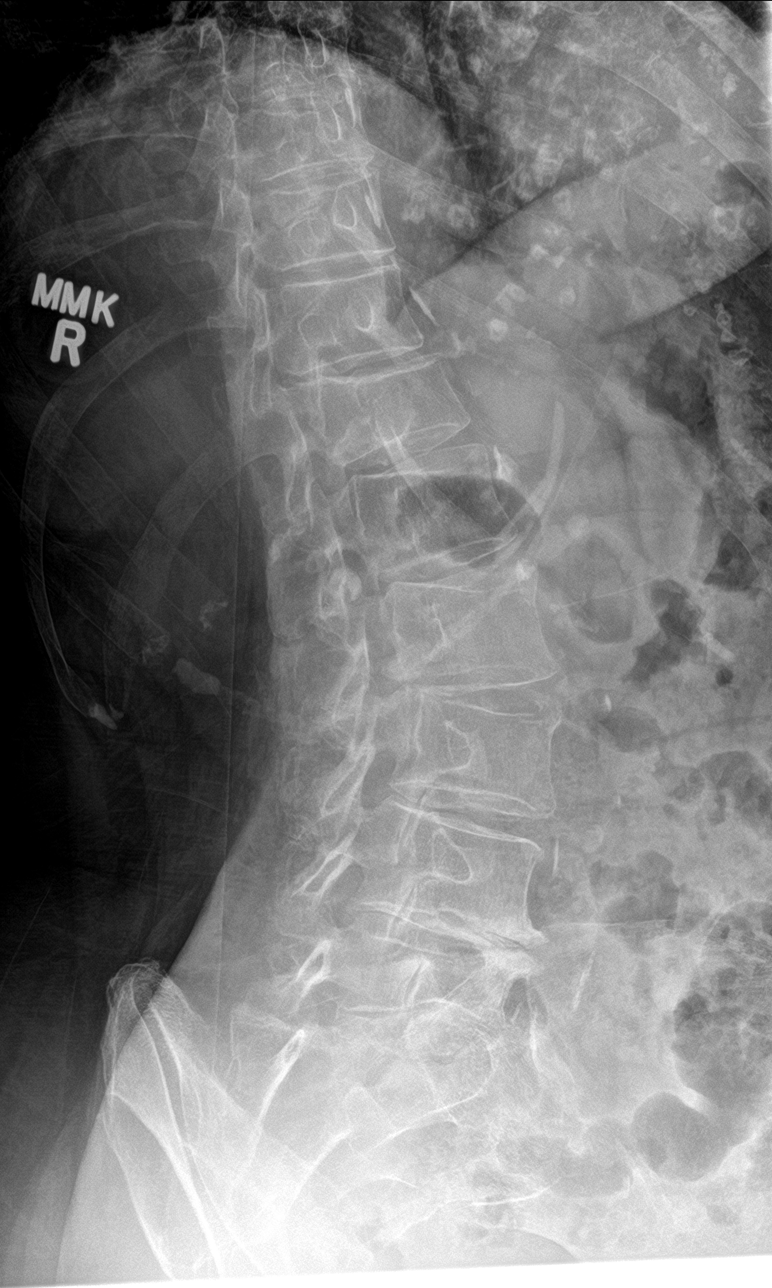

[l-spine lat]
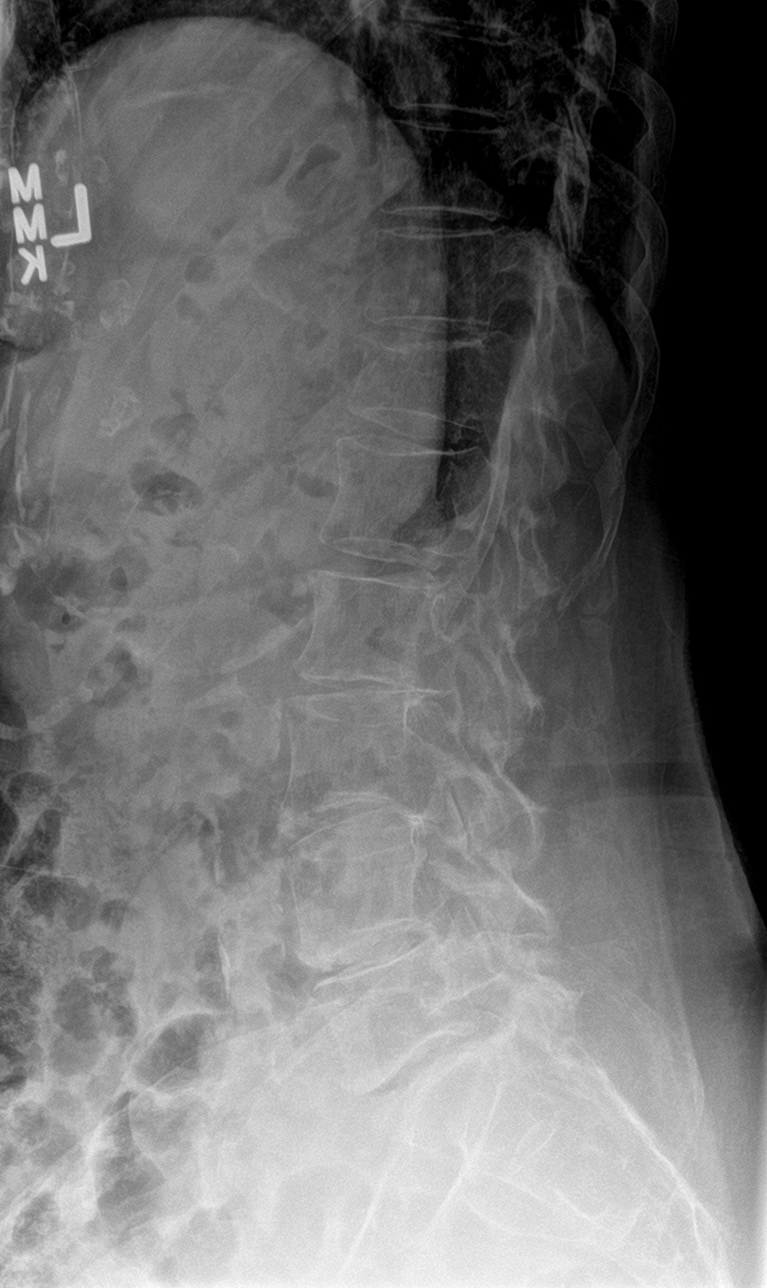

[l-spine spot]
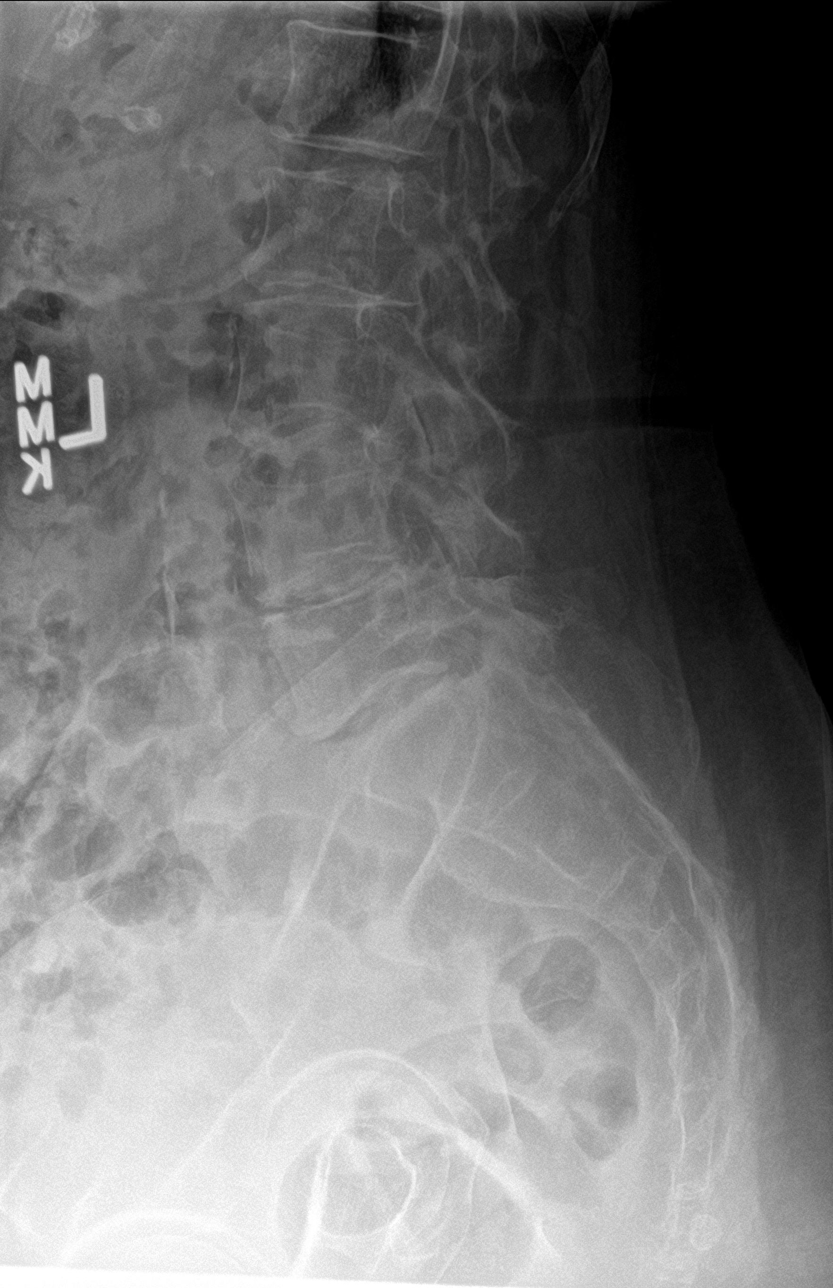

[5 of 5 positions shown; findings below may reference images not displayed]

FINDINGS: There is a degenerative levoscoliosis centered in the mid lumbar
spine. Mild-to-moderate degenerative changes are noted throughout
the lumbar spine, greatest in the lower lumbar segments were there
is moderate to severe disc height loss and mild to moderate facet
arthrosis. There is an age-indeterminate compression fracture of the
T12 vertebral body with approximately 40% height loss anteriorly.
IMPRESSION: 1. Age-indeterminate compression fracture of the T12 vertebral body.
2. Mild to moderate multilevel degenerative changes of the lumbar
spine.
3. Degenerative levoscoliosis centered in the mid lumbar spine.

## 2022-02-19 ENCOUNTER — Telehealth: Payer: Self-pay

## 2022-02-19 ENCOUNTER — Other Ambulatory Visit: Payer: Self-pay

## 2022-02-19 MED ORDER — LISINOPRIL 20 MG PO TABS: 20.0000 mg | ORAL_TABLET | Freq: Every day | ORAL | 1 refills | Status: DC

## 2022-02-19 NOTE — Telephone Encounter (Signed)
Patient refill request.  [facility]  Scheduled medicare well visit for 2/28. She requested in office appt.  levothyroxine (SYNTHROID) 88 MCG tablet   lisinopril (ZESTRIL) 20 MG tablet

## 2022-02-19 NOTE — Telephone Encounter (Signed)
Lisinopril rx sent levothyroxine good til sept

## 2022-02-21 ENCOUNTER — Ambulatory Visit (INDEPENDENT_AMBULATORY_CARE_PROVIDER_SITE_OTHER): Payer: PPO

## 2022-02-21 DIAGNOSIS — E538 Deficiency of other specified B group vitamins: Secondary | ICD-10-CM | POA: Diagnosis not present

## 2022-02-21 MED ORDER — CYANOCOBALAMIN 1000 MCG/ML IJ SOLN
1000.0000 ug | Freq: Once | INTRAMUSCULAR | Status: AC
  Administered 2022-02-21: 1000 ug via INTRAMUSCULAR

## 2022-02-21 NOTE — Progress Notes (Signed)
Pt here today for monthly B12 inj. Shot was given with no issue. Rx provided by office.

## 2022-02-26 DIAGNOSIS — H43813 Vitreous degeneration, bilateral: Secondary | ICD-10-CM | POA: Diagnosis not present

## 2022-02-26 DIAGNOSIS — H353123 Nonexudative age-related macular degeneration, left eye, advanced atrophic without subfoveal involvement: Secondary | ICD-10-CM | POA: Diagnosis not present

## 2022-02-26 DIAGNOSIS — H35033 Hypertensive retinopathy, bilateral: Secondary | ICD-10-CM | POA: Diagnosis not present

## 2022-02-26 DIAGNOSIS — H353112 Nonexudative age-related macular degeneration, right eye, intermediate dry stage: Secondary | ICD-10-CM | POA: Diagnosis not present

## 2022-03-01 ENCOUNTER — Ambulatory Visit (INDEPENDENT_AMBULATORY_CARE_PROVIDER_SITE_OTHER): Payer: PPO | Admitting: Family Medicine

## 2022-03-01 ENCOUNTER — Encounter: Payer: Self-pay | Admitting: Family Medicine

## 2022-03-01 VITALS — BP 133/79 | HR 72 | Temp 97.9°F | Wt 142.0 lb

## 2022-03-01 DIAGNOSIS — R300 Dysuria: Secondary | ICD-10-CM | POA: Diagnosis not present

## 2022-03-01 DIAGNOSIS — N3001 Acute cystitis with hematuria: Secondary | ICD-10-CM | POA: Diagnosis not present

## 2022-03-01 LAB — POC URINALSYSI DIPSTICK (AUTOMATED)
Bilirubin, UA: NEGATIVE
Glucose, UA: NEGATIVE
Ketones, UA: NEGATIVE
Nitrite, UA: NEGATIVE
Protein, UA: NEGATIVE
Spec Grav, UA: 1.01 (ref 1.010–1.025)
Urobilinogen, UA: 0.2 E.U./dL
pH, UA: 6 (ref 5.0–8.0)

## 2022-03-01 MED ORDER — SULFAMETHOXAZOLE-TRIMETHOPRIM 800-160 MG PO TABS: 1.0000 | ORAL_TABLET | Freq: Two times a day (BID) | ORAL | 0 refills | Status: AC

## 2022-03-01 NOTE — Patient Instructions (Addendum)
Return if symptoms worsen or fail to improve.        Great to see you today.  I have refilled the medication(s) we provide.   If labs were collected, we will inform you of lab results once received either by echart message or telephone call.   - echart message- for normal results that have been seen by the patient already.   - telephone call: abnormal results or if patient has not viewed results in their echart.  

## 2022-03-01 NOTE — Progress Notes (Signed)
Denise Fuller , 22-Jan-1936, 87 y.o., female MRN: [id number] Patient Care Team    Relationship Specialty Notifications Start End  Ma Hillock, DO PCP - General Family Medicine  01/10/16   Otelia Sergeant, OD Referring Physician   01/10/16    Comment: ophth- macular degeneration  [facility]  04/26/17     Chief Complaint  Patient presents with   Urinary Tract Infection    Pain and burning     Subjective: Pt presents for an OV with complaints of dysuria that started 2 days ago.  She denies fevers, chills, headaches, dizziness or nausea.  In the past she has experienced pansensitive E. coli UTI.  Typically responds well to Bactrim for her.     03/29/2021    9:44 AM 02/10/2021    4:07 PM 03/23/2020    9:03 AM 10/15/2018    9:42 AM 04/10/2018   10:07 AM  Depression screen PHQ 2/9  Decreased Interest 0 0 0 0 0  Down, Depressed, Hopeless 0 0 0 0 0  PHQ - 2 Score 0 0 0 0 0  Altered sleeping     0  Tired, decreased energy     0  Change in appetite     0  Feeling bad or failure about yourself      0  Trouble concentrating     0  Moving slowly or fidgety/restless     0  Suicidal thoughts     0  PHQ-9 Score     0  Difficult doing work/chores     Not difficult at all    Allergies  Allergen Reactions   Doxycycline     GI upset   Keflex [Cephalexin] Hives and Itching   Iron Rash   Social History   Social History Narrative   Retired. Married to [place]. They have 3 children [name])   [place] from [place] 05/2014.    Has 2 older dogs.    Retired.    Drinks caffeine, takes herbal remedies and dialy vitamin.   Wears her seatbelt, smoke detector in the home   Wears dentures, no assistive devices for walking - independent.    Feels safe in her relationships.    Past Medical History:  Diagnosis Date   Abnormal SPEP 07/16/2019   B12 deficiency 07/16/2019   Bartonella infection    BCC (basal cell carcinoma of skin) 1997   nose   Chickenpox    Chronic  bilateral low back pain without sciatica 06/23/2019   Chronic venous insufficiency    Diverticulosis    Dyspnea on exertion 07/10/2019   Elevated alkaline phosphatase level    Elevated LDH    Elevated serum immunoglobulin free light chains 07/21/2019   Environmental allergies    GAD (generalized anxiety disorder)    Goals of care, counseling/discussion 08/28/2019   Groin abscess    culture negative, doxy resolved abscess   Hepatitis A    Hyperlipidemia    Hypertension    Hypothyroidism    Ingrown nail 04/10/2018   Lightheadedness 12/03/2018   Lymphadenopathy, cervical    chronic per pt. (left)   Macular degeneration    Meniere disease    MGUS (monoclonal gammopathy of unknown significance) 08/28/2019   Other fatigue 12/03/2018   Overweight (BMI 25.0-29.9) 04/10/2018   Palpitations 12/03/2018   Thyroid disease    Weakness generalized 12/03/2018   Past Surgical History:  Procedure Laterality Date   ABDOMINAL  HYSTERECTOMY  1991   APPENDECTOMY  1967   BASAL CELL CARCINOMA EXCISION  1997   nose   BLEPHAROPLASTY     CATARACT EXTRACTION W/ INTRAOCULAR LENS IMPLANT Bilateral Oct and Nov 2018   CHOLECYSTECTOMY  1967   ORIF WRIST FRACTURE Left 05/18/2020   Family History  Problem Relation Age of Onset   Arthritis Mother    Heart disease Mother    Arthritis Father    Aneurysm Father 74       brain   Alzheimer's disease Father    Thyroid disease Daughter    Stroke Daughter        2020   Atrial fibrillation Son        2020   Allergies as of 03/01/2022       Reactions   Doxycycline    GI upset   Keflex [cephalexin] Hives, Itching   Iron Rash        Medication List        Accurate as of March 01, 2022 12:52 PM. If you have any questions, ask your nurse or doctor.          aspirin EC 81 MG tablet Take 81 mg by mouth daily.   B-12 Compliance Injection 1000 MCG/ML Kit Generic drug: Cyanocobalamin Inject 1,000 mcg as directed every 30 (thirty) days.  08/28/2019 Weekly   FLAX SEED OIL PO Take 1,300 mg by mouth 2 (two) times daily.   levothyroxine 88 MCG tablet Commonly known as: SYNTHROID Take 1 tablet (88 mcg total) by mouth daily before breakfast.   lisinopril 20 MG tablet Commonly known as: ZESTRIL Take 1 tablet (20 mg total) by mouth daily.   MULTIPLE VITAMINS/WOMENS PO Take 1 tablet by mouth daily. Unknown strength   PRESERVISION AREDS PO Take by mouth.   sulfamethoxazole-trimethoprim 800-160 MG tablet Commonly known as: BACTRIM DS Take 1 tablet by mouth 2 (two) times daily for 7 days. Started by: Howard Pouch, DO   SYSTANE OP Apply 1 drop to eye as needed (Eye dryness).   Vitamin D3 25 MCG (1000 UT) Caps Take 1 capsule by mouth daily. 2,000 units daily        All past medical history, surgical history, allergies, family history, immunizations andmedications were updated in the EMR today and reviewed under the history and medication portions of their EMR.     Review of Systems  Constitutional:  Negative for chills, fever and malaise/fatigue.  Gastrointestinal:  Negative for abdominal pain, constipation, diarrhea, nausea and vomiting.  Genitourinary:  Positive for dysuria, frequency and hematuria. Negative for flank pain.  Musculoskeletal:  Negative for back pain.   Negative, with the exception of above mentioned in HPI   Objective:  BP 133/79   Pulse 72   Temp 97.9 F (36.6 C)   Wt 142 lb (64.4 kg)   SpO2 99%   BMI 24.37 kg/m  Body mass index is 24.37 kg/m. Physical Exam Vitals and nursing note reviewed.  Constitutional:      General: She is not in acute distress.    Appearance: Normal appearance. She is normal weight. She is not ill-appearing or toxic-appearing.  HENT:     Head: Normocephalic and atraumatic.  Eyes:     General: No scleral icterus.       Right eye: No discharge.        Left eye: No discharge.     Extraocular Movements: Extraocular movements intact.     Conjunctiva/sclera:  Conjunctivae normal.  Pupils: Pupils are equal, round, and reactive to light.  Cardiovascular:     Rate and Rhythm: Normal rate and regular rhythm.  Pulmonary:     Effort: Pulmonary effort is normal.  Abdominal:     General: Abdomen is flat. Bowel sounds are normal. There is no distension.     Palpations: Abdomen is soft.     Tenderness: There is no abdominal tenderness. There is no right CVA tenderness, left CVA tenderness, guarding or rebound.  Skin:    Findings: No rash.  Neurological:     Mental Status: She is alert and oriented to person, place, and time. Mental status is at baseline.     Motor: No weakness.     Coordination: Coordination normal.     Gait: Gait normal.  Psychiatric:        Mood and Affect: Mood normal.        Behavior: Behavior normal.        Thought Content: Thought content normal.        Judgment: Judgment normal.      No results found. No results found. Results for orders placed or performed in visit on 03/01/22 (from the past 24 hour(s))  POCT Urinalysis Dipstick (Automated)     Status: Abnormal   Collection Time: 03/01/22 11:34 AM  Result Value Ref Range   Color, UA yellow    Clarity, UA cloudy    Glucose, UA Negative Negative   Bilirubin, UA neg    Ketones, UA neg    Spec Grav, UA 1.010 1.010 - 1.025   Blood, UA 2+    pH, UA 6.0 5.0 - 8.0   Protein, UA Negative Negative   Urobilinogen, UA 0.2 0.2 or 1.0 E.U./dL   Nitrite, UA neg.    Leukocytes, UA Large (3+) (A) Negative    Assessment/Plan: Denise Fuller is a 87 y.o. female present for OV for  Acute cystitis with hematuria/dysuria Patient has a history of pansensitive E. coli in the past that has responded well to Bactrim.  Point-of-care urinalysis appears infectious today with large leukocytes and +2 blood. Start Bactrim DS twice daily x 7 days.  Urine culture is sent and patient will be called with these results. - POCT Urinalysis Dipstick (Automated) - Urinalysis w microscopic +  reflex cultur   Reviewed expectations re: course of current medical issues. Discussed self-management of symptoms. Outlined signs and symptoms indicating need for more acute intervention. Patient verbalized understanding and all questions were answered. Patient received an After-Visit Summary.    Orders Placed This Encounter  Procedures   Urinalysis w microscopic + reflex cultur   POCT Urinalysis Dipstick (Automated)   Meds ordered this encounter  Medications   sulfamethoxazole-trimethoprim (BACTRIM DS) 800-160 MG tablet    Sig: Take 1 tablet by mouth 2 (two) times daily for 7 days.    Dispense:  14 tablet    Refill:  0   Referral Orders  No referral(s) requested today     Note is dictated utilizing voice recognition software. Although note has been proof read prior to signing, occasional typographical errors still can be missed. If any questions arise, please do not hesitate to call for verification.   electronically signed by:  Howard Pouch, DO  [place]

## 2022-03-04 LAB — URINALYSIS W MICROSCOPIC + REFLEX CULTURE
Bilirubin Urine: NEGATIVE
Glucose, UA: NEGATIVE
Ketones, ur: NEGATIVE
Nitrites, Initial: NEGATIVE
Protein, ur: NEGATIVE
Specific Gravity, Urine: 1.01 (ref 1.001–1.035)
pH: 7.5 (ref 5.0–8.0)

## 2022-03-04 LAB — URINE CULTURE
MICRO NUMBER:: 14476911
SPECIMEN QUALITY:: ADEQUATE

## 2022-03-04 LAB — CULTURE INDICATED

## 2022-03-05 ENCOUNTER — Telehealth: Payer: Self-pay | Admitting: Family Medicine

## 2022-03-05 NOTE — Telephone Encounter (Signed)
Spoke with pt regarding labs and instructions.   

## 2022-03-05 NOTE — Telephone Encounter (Signed)
Please inform patient her urine culture did not showing overwhelming growth of a specific bacteria. I recommend she continue the antibiotic prescribed as long as she is seeing improvement in her condition.  If she is still having symptoms after completing antibiotic, I would encourage her to follow-up at that time we would need to do further evaluation on cause.

## 2022-03-14 ENCOUNTER — Telehealth: Payer: Self-pay | Admitting: Family Medicine

## 2022-03-14 DIAGNOSIS — J8489 Other specified interstitial pulmonary diseases: Secondary | ICD-10-CM | POA: Diagnosis not present

## 2022-03-14 DIAGNOSIS — R4182 Altered mental status, unspecified: Secondary | ICD-10-CM | POA: Diagnosis not present

## 2022-03-14 DIAGNOSIS — Z881 Allergy status to other antibiotic agents status: Secondary | ICD-10-CM | POA: Diagnosis not present

## 2022-03-14 DIAGNOSIS — E876 Hypokalemia: Secondary | ICD-10-CM | POA: Diagnosis not present

## 2022-03-14 DIAGNOSIS — J101 Influenza due to other identified influenza virus with other respiratory manifestations: Secondary | ICD-10-CM | POA: Diagnosis not present

## 2022-03-14 DIAGNOSIS — Z79899 Other long term (current) drug therapy: Secondary | ICD-10-CM | POA: Diagnosis not present

## 2022-03-14 DIAGNOSIS — Z1152 Encounter for screening for COVID-19: Secondary | ICD-10-CM | POA: Diagnosis not present

## 2022-03-14 DIAGNOSIS — S59902A Unspecified injury of left elbow, initial encounter: Secondary | ICD-10-CM | POA: Diagnosis not present

## 2022-03-14 DIAGNOSIS — Z7982 Long term (current) use of aspirin: Secondary | ICD-10-CM | POA: Diagnosis not present

## 2022-03-14 DIAGNOSIS — R296 Repeated falls: Secondary | ICD-10-CM | POA: Diagnosis not present

## 2022-03-14 DIAGNOSIS — I10 Essential (primary) hypertension: Secondary | ICD-10-CM | POA: Diagnosis not present

## 2022-03-14 DIAGNOSIS — R9082 White matter disease, unspecified: Secondary | ICD-10-CM | POA: Diagnosis not present

## 2022-03-14 DIAGNOSIS — Z9049 Acquired absence of other specified parts of digestive tract: Secondary | ICD-10-CM | POA: Diagnosis not present

## 2022-03-14 DIAGNOSIS — R531 Weakness: Secondary | ICD-10-CM | POA: Diagnosis not present

## 2022-03-14 DIAGNOSIS — Z79890 Hormone replacement therapy (postmenopausal): Secondary | ICD-10-CM | POA: Diagnosis not present

## 2022-03-14 DIAGNOSIS — E039 Hypothyroidism, unspecified: Secondary | ICD-10-CM | POA: Diagnosis not present

## 2022-03-14 NOTE — Telephone Encounter (Signed)
Patients daughter called [name] and wanted to get her mother seen Denise Fuller today with Dr. Raoul Pitch. However based on symptoms and her being dehydrated it was strongly suggested that she take her mother to the hospital for her to get fluids. She agreed and said she would take her in.

## 2022-03-14 NOTE — Telephone Encounter (Signed)
FYI

## 2022-03-29 ENCOUNTER — Ambulatory Visit (INDEPENDENT_AMBULATORY_CARE_PROVIDER_SITE_OTHER): Payer: PPO | Admitting: Family Medicine

## 2022-03-29 ENCOUNTER — Encounter: Payer: Self-pay | Admitting: Family Medicine

## 2022-03-29 VITALS — BP 108/73 | HR 79 | Temp 98.2°F | Wt 136.2 lb

## 2022-03-29 DIAGNOSIS — R768 Other specified abnormal immunological findings in serum: Secondary | ICD-10-CM | POA: Diagnosis not present

## 2022-03-29 DIAGNOSIS — R7689 Other specified abnormal immunological findings in serum: Secondary | ICD-10-CM

## 2022-03-29 DIAGNOSIS — D472 Monoclonal gammopathy: Secondary | ICD-10-CM | POA: Diagnosis not present

## 2022-03-29 DIAGNOSIS — E782 Mixed hyperlipidemia: Secondary | ICD-10-CM

## 2022-03-29 DIAGNOSIS — I10 Essential (primary) hypertension: Secondary | ICD-10-CM | POA: Diagnosis not present

## 2022-03-29 DIAGNOSIS — N1831 Chronic kidney disease, stage 3a: Secondary | ICD-10-CM

## 2022-03-29 DIAGNOSIS — N390 Urinary tract infection, site not specified: Secondary | ICD-10-CM | POA: Diagnosis not present

## 2022-03-29 DIAGNOSIS — E538 Deficiency of other specified B group vitamins: Secondary | ICD-10-CM | POA: Diagnosis not present

## 2022-03-29 DIAGNOSIS — E039 Hypothyroidism, unspecified: Secondary | ICD-10-CM | POA: Diagnosis not present

## 2022-03-29 MED ORDER — CYANOCOBALAMIN 1000 MCG/ML IJ SOLN
1000.0000 ug | Freq: Once | INTRAMUSCULAR | Status: AC
  Administered 2022-07-16: 1000 ug via INTRAMUSCULAR

## 2022-03-29 MED ORDER — LISINOPRIL 20 MG PO TABS: 20.0000 mg | ORAL_TABLET | Freq: Every day | ORAL | 1 refills | Status: DC

## 2022-03-29 NOTE — Patient Instructions (Addendum)
Return in about 24 weeks (around 09/13/2022) for cpe (20 min), Routine chronic condition follow-up.        Great to see you today.  I have refilled the medication(s) we provide.   If labs were collected, we will inform you of lab results once received either by echart message or telephone call.   - echart message- for normal results that have been seen by the patient already.   - telephone call: abnormal results or if patient has not viewed results in their echart.

## 2022-03-29 NOTE — Progress Notes (Signed)
Denise Fuller , December 07, 1935, 87 y.o., female MRN: [id number] Patient Care Team    Relationship Specialty Notifications Start End  Ma Hillock, DO PCP - General Family Medicine  01/10/16   Otelia Sergeant, OD Referring Physician   01/10/16    Comment: ophth- macular degeneration  [facility]  04/26/17     Chief Complaint  Patient presents with   Hypertension     Subjective: Denise Fuller is a 87 y.o. female present for  Essential hypertension/HLD Pt reports compliance with lisinopril 20  mg a day.  Patient denies chest pain, shortness of breath, dizziness or lower extremity edema.  Pt takesdaily baby ASA.   Hypothyroid: Labs UTD 10/2021. Pt compliant with levo 88 mcg qd.   Cognitive decline/b12 def: She has received B12 injections as indicated and has noticed an improvement.   Prior note: Pt presents for an OV with her daughter today to discuss cognitive changes that have been noticed over the last year. Pt reports she has noticed she is more forgetful of appts and details of conversations. Her daughter reports the family noticed changes a little of a year ago. Started with forgetting having conversations. Daughter reports last couple of months there has been changes in her cooking, forgetting appts, even forgetting she wrote down appts, more details of conversations. When she is told she forgot she has started to become more "elevated." She has h/o b12 def and thyroid disorder. She reports compliance with levothyroxine and monthly b12 inj.  She has had a h/o of abnormal spep, which has been stable. No h/o  stroke, but endorses today her eye doctor told her she was having "mini-strokes of her eye." She takes a baby ASA and BP is well controlled on medication.      03/29/2021    9:44 AM 02/10/2021    4:07 PM 03/23/2020    9:03 AM 10/15/2018    9:42 AM 04/10/2018   10:07 AM  Depression screen PHQ 2/9  Decreased Interest 0 0 0 0 0  Down, Depressed, Hopeless 0 0 0 0 0  PHQ  - 2 Score 0 0 0 0 0  Altered sleeping     0  Tired, decreased energy     0  Change in appetite     0  Feeling bad or failure about yourself      0  Trouble concentrating     0  Moving slowly or fidgety/restless     0  Suicidal thoughts     0  PHQ-9 Score     0  Difficult doing work/chores     Not difficult at all    Allergies  Allergen Reactions   Doxycycline     GI upset   Keflex [Cephalexin] Hives and Itching   Iron Rash   Social History   Social History Narrative   Retired. Married to [place]. They have 3 children [name])   [place] from [place] 05/2014.    Has 2 older dogs.    Retired.    Drinks caffeine, takes herbal remedies and dialy vitamin.   Wears her seatbelt, smoke detector in the home   Wears dentures, no assistive devices for walking - independent.    Feels safe in her relationships.    Past Medical History:  Diagnosis Date   Abnormal SPEP 07/16/2019   B12 deficiency 07/16/2019   Bartonella infection    BCC (basal cell carcinoma of skin) 1997  nose   Chickenpox    Chronic bilateral low back pain without sciatica 06/23/2019   Chronic venous insufficiency    Diverticulosis    Dyspnea on exertion 07/10/2019   Elevated alkaline phosphatase level    Elevated LDH    Elevated serum immunoglobulin free light chains 07/21/2019   Environmental allergies    GAD (generalized anxiety disorder)    Goals of care, counseling/discussion 08/28/2019   Groin abscess    culture negative, doxy resolved abscess   Hepatitis A    Hyperlipidemia    Hypertension    Hypothyroidism    Ingrown nail 04/10/2018   Lightheadedness 12/03/2018   Lymphadenopathy, cervical    chronic per pt. (left)   Macular degeneration    Meniere disease    MGUS (monoclonal gammopathy of unknown significance) 08/28/2019   Other fatigue 12/03/2018   Overweight (BMI 25.0-29.9) 04/10/2018   Palpitations 12/03/2018   Thyroid disease    Weakness generalized 12/03/2018   Past Surgical History:   Procedure Laterality Date   ABDOMINAL HYSTERECTOMY  1991   APPENDECTOMY  1967   BASAL CELL CARCINOMA EXCISION  1997   nose   BLEPHAROPLASTY     CATARACT EXTRACTION W/ INTRAOCULAR LENS IMPLANT Bilateral Oct and Nov 2018   CHOLECYSTECTOMY  1967   ORIF WRIST FRACTURE Left 05/18/2020   Family History  Problem Relation Age of Onset   Arthritis Mother    Heart disease Mother    Arthritis Father    Aneurysm Father 89       brain   Alzheimer's disease Father    Thyroid disease Daughter    Stroke Daughter        2020   Atrial fibrillation Son        2020    All past medical history, surgical history, allergies, family history, immunizations andmedications were updated in the EMR today and reviewed under the history and medication portions of their EMR.     ROS: Negative, with the exception of above mentioned in HPI   Objective:  BP 108/73   Pulse 79   Temp 98.2 F (36.8 C)   Wt 136 lb 3.2 oz (61.8 kg)   SpO2 97%   BMI 23.38 kg/m  Body mass index is 23.38 kg/m. Physical Exam Vitals and nursing note reviewed.  Constitutional:      General: She is not in acute distress.    Appearance: Normal appearance. She is not ill-appearing, toxic-appearing or diaphoretic.  HENT:     Head: Normocephalic and atraumatic.     Mouth/Throat:     Mouth: Mucous membranes are moist.  Eyes:     General: No scleral icterus.       Right eye: No discharge.        Left eye: No discharge.     Extraocular Movements: Extraocular movements intact.     Conjunctiva/sclera: Conjunctivae normal.     Pupils: Pupils are equal, round, and reactive to light.  Cardiovascular:     Rate and Rhythm: Normal rate and regular rhythm.     Heart sounds: No murmur heard. Pulmonary:     Effort: Pulmonary effort is normal. No respiratory distress.     Breath sounds: Normal breath sounds. No wheezing, rhonchi or rales.  Musculoskeletal:     Cervical back: Neck supple. No tenderness.     Right lower leg: No  edema.     Left lower leg: No edema.  Lymphadenopathy:     Cervical: No cervical adenopathy.  Skin:  General: Skin is warm and dry.     Coloration: Skin is not jaundiced or pale.     Findings: No erythema or rash.  Neurological:     Mental Status: She is alert and oriented to person, place, and time. Mental status is at baseline.     Motor: No weakness.     Gait: Gait normal.  Psychiatric:        Mood and Affect: Mood normal.        Behavior: Behavior normal.        Thought Content: Thought content normal.        Judgment: Judgment normal.    No results found. No results found. No results found for this or any previous visit (from the past 24 hour(s)).   Assessment/Plan: Denise Fuller is a 87 y.o. female present for OV  HTN/Mixed hyperlipidemia Stable Dc dyazide and use only if swelling is appreciated.  Continue lisinopril 20 mg a day. Do not recommend monitoring any further at home unless dizziness occurs. Anxiety is driving up home pressures.  Labs up-to-date 10/12/2021  Hypothyroidism due to acquired atrophy of thyroid Continue levothyroxine 88 mcg daily.  Labs up-to-date 10/12/2021  B12 deficiency Continue B12 injections monthly, indefinitely according to schedule. B12 injection provided today  MGUS (monoclonal gammopathy of unknown significance)/Elevated serum immunoglobulin free light chains/Stage 3a chronic kidney disease (HCC) -Routine SPEP, BMP every 6 months.  If  M spike recurs then will refer to oncology at that time (per onc). - Protein,Total and Electrophor w/IFE-([facility])  Recurrent UTI Urine culture  Reviewed expectations re: course of current medical issues. Discussed self-management of symptoms. Outlined signs and symptoms indicating need for more acute intervention. Patient verbalized understanding and all questions were answered. Patient received an After-Visit Summary.   Orders Placed This Encounter  Procedures   Urine Culture   Comp Met  (CMET)   Protein,Total and Electrophor w/IFE   Meds ordered this encounter  Medications   cyanocobalamin (VITAMIN B12) injection 1,000 mcg   lisinopril (ZESTRIL) 20 MG tablet    Sig: Take 1 tablet (20 mg total) by mouth daily.    Dispense:  90 tablet    Refill:  1    Referral Orders  No referral(s) requested today     Note is dictated utilizing voice recognition software. Although note has been proof read prior to signing, occasional typographical errors still can be missed. If any questions arise, please do not hesitate to call for verification.   electronically signed by:  Howard Pouch, DO  [place]

## 2022-03-30 ENCOUNTER — Telehealth: Payer: Self-pay | Admitting: Family Medicine

## 2022-03-30 NOTE — Telephone Encounter (Signed)
Pt would like to know if her appointment on 04/04/22 is necessary. She has had the pneumonia vaccine a few times. I advised her the medicare well visit could be switched to a phone call, but she would like a CMA to call her to let her know if this is necessary.

## 2022-03-30 NOTE — Telephone Encounter (Signed)
Unable to LVM due to line being disconnected.   Note:  This is an opportunity for [place] to connect with our pts and understand. Also to provide resources and update chart.

## 2022-03-31 LAB — URINE CULTURE
MICRO NUMBER:: 14601781
SPECIMEN QUALITY:: ADEQUATE

## 2022-04-02 ENCOUNTER — Ambulatory Visit (INDEPENDENT_AMBULATORY_CARE_PROVIDER_SITE_OTHER): Payer: PPO

## 2022-04-02 DIAGNOSIS — Z0000 Encounter for general adult medical examination without abnormal findings: Secondary | ICD-10-CM | POA: Diagnosis not present

## 2022-04-02 NOTE — Patient Instructions (Signed)

## 2022-04-02 NOTE — Progress Notes (Signed)
Subjective:   Denise Fuller is a 87 y.o. female who presents for Medicare Annual (Subsequent) preventive examination.  I connected with  Denise Fuller on 04/02/22 by an audio only telemedicine application and verified that I am speaking with the correct person using two identifiers.   I discussed the limitations, risks, security and privacy concerns of performing an evaluation and management service by telephone and the availability of in person appointments. I also discussed with the patient that there may be a patient responsible charge related to this service. The patient expressed understanding and verbally consented to this telephonic visit.  Location of Patient: home Location of Provider: office  List any persons and their role that are participating in the visit with the patient.   [name], CMA  Review of Systems    Defer to PCP Cardiac Risk Factors include: advanced age (>62mn, >>30women);dyslipidemia;hypertension     Objective:    There were no vitals filed for this visit. There is no height or weight on file to calculate BMI.     04/02/2022    1:23 PM 03/29/2021    9:46 AM 03/23/2020    8:58 AM 08/28/2019    2:08 PM 04/26/2017    8:33 AM  Advanced Directives  Does Patient Have a Medical Advance Directive? Yes Yes Yes Yes Yes  Type of AParamedicof APhiloLiving will Living will HSt. George IslandLiving will HChipleyLiving will HSweenyLiving will  Does patient want to make changes to medical advance directive? No - Patient declined      Copy of [place] Chart?   No - copy requested  No - copy requested    Current Medications (verified) Outpatient Encounter Medications as of 04/02/2022  Medication Sig   aspirin EC 81 MG tablet Take 81 mg by mouth daily.   Cholecalciferol (VITAMIN D3) 1000 units CAPS Take 1 capsule by mouth daily. 2,000 units  daily   Cyanocobalamin (B-12 COMPLIANCE INJECTION) 1000 MCG/ML KIT Inject 1,000 mcg as directed every 30 (thirty) days. 08/28/2019 Weekly   Flaxseed, Linseed, (FLAX SEED OIL PO) Take 1,300 mg by mouth 2 (two) times daily.   levothyroxine (SYNTHROID) 88 MCG tablet Take 1 tablet (88 mcg total) by mouth daily before breakfast.   lisinopril (ZESTRIL) 20 MG tablet Take 1 tablet (20 mg total) by mouth daily.   Multiple Vitamins-Minerals (MULTIPLE VITAMINS/WOMENS PO) Take 1 tablet by mouth daily. Unknown strength   Multiple Vitamins-Minerals (PRESERVISION AREDS PO) Take by mouth.   Polyethyl Glycol-Propyl Glycol (SYSTANE OP) Apply 1 drop to eye as needed (Eye dryness).   Facility-Administered Encounter Medications as of 04/02/2022  Medication   cyanocobalamin (VITAMIN B12) injection 1,000 mcg    Allergies (verified) Doxycycline, Keflex [cephalexin], and Iron   History: Past Medical History:  Diagnosis Date   Abnormal SPEP 07/16/2019   B12 deficiency 07/16/2019   Bartonella infection    BCC (basal cell carcinoma of skin) 1997   nose   Chickenpox    Chronic bilateral low back pain without sciatica 06/23/2019   Chronic venous insufficiency    Diverticulosis    Dyspnea on exertion 07/10/2019   Elevated alkaline phosphatase level    Elevated LDH    Elevated serum immunoglobulin free light chains 07/21/2019   Environmental allergies    GAD (generalized anxiety disorder)    Goals of care, counseling/discussion 08/28/2019   Groin abscess    culture negative,  doxy resolved abscess   Hepatitis A    Hyperlipidemia    Hypertension    Hypothyroidism    Ingrown nail 04/10/2018   Lightheadedness 12/03/2018   Lymphadenopathy, cervical    chronic per pt. (left)   Macular degeneration    Meniere disease    MGUS (monoclonal gammopathy of unknown significance) 08/28/2019   Other fatigue 12/03/2018   Overweight (BMI 25.0-29.9) 04/10/2018   Palpitations 12/03/2018   Thyroid disease    Weakness generalized  12/03/2018   Past Surgical History:  Procedure Laterality Date   ABDOMINAL HYSTERECTOMY  1991   APPENDECTOMY  1967   BASAL CELL CARCINOMA EXCISION  1997   nose   BLEPHAROPLASTY     CATARACT EXTRACTION W/ INTRAOCULAR LENS IMPLANT Bilateral Oct and Nov 2018   CHOLECYSTECTOMY  1967   ORIF WRIST FRACTURE Left 05/18/2020   Family History  Problem Relation Age of Onset   Arthritis Mother    Heart disease Mother    Arthritis Father    Aneurysm Father 69       brain   Alzheimer's disease Father    Thyroid disease Daughter    Stroke Daughter        2020   Atrial fibrillation Son        2020   Social History   Socioeconomic History   Marital status: Married    Spouse name: Denise Fuller   Number of children: 3   Years of education: Not on file   Highest education level: Not on file  Occupational History   Occupation: retired  Tobacco Use   Smoking status: Never   Smokeless tobacco: Never  Vaping Use   Vaping Use: Never used  Substance and Sexual Activity   Alcohol use: No   Drug use: No   Sexual activity: Yes    Partners: Male    Comment: married  Other Topics Concern   Not on file  Social History Narrative   Retired. Married to [place]. They have 3 children [name])   [place] from [place] 05/2014.    Has 2 older dogs.    Retired.    Drinks caffeine, takes herbal remedies and dialy vitamin.   Wears her seatbelt, smoke detector in the home   Wears dentures, no assistive devices for walking - independent.    Feels safe in her relationships.    Social Determinants of Health   Financial Resource Strain: Low Risk  (04/02/2022)   Overall Financial Resource Strain (CARDIA)    Difficulty of Paying Living Expenses: Not hard at all  Food Insecurity: No Food Insecurity (04/02/2022)   Hunger Vital Sign    Worried About Running Out of Food in the Last Year: Never true    Ran Out of Food in the Last Year: Never true  Transportation Needs: No Transportation Needs  (04/02/2022)   PRAPARE - Hydrologist (Medical): No    Lack of Transportation (Non-Medical): No  Physical Activity: Insufficiently Active (04/02/2022)   Exercise Vital Sign    Days of Exercise per Week: 7 days    Minutes of Exercise per Session: 20 min  Stress: No Stress Concern Present (04/02/2022)   [place]    Feeling of Stress : Not at all  Social Connections: Moderately Isolated (04/02/2022)   Social Connection and Isolation Panel [NHANES]    Frequency of Communication with Friends and Family: More than three times a week  Frequency of Social Gatherings with Friends and Family: More than three times a week    Attends Religious Services: Never    Marine scientist or Organizations: No    Attends Music therapist: Never    Marital Status: Married    Tobacco Counseling Counseling given: Not Answered   Clinical Intake:     Pain : No/denies pain     Nutritional Risks: None Diabetes: No  How often do you need to have someone help you when you read instructions, pamphlets, or other written materials from your doctor or pharmacy?: 1 - Never  Diabetic?no         Activities of Daily Living    04/02/2022    1:24 PM  In your present state of health, do you have any difficulty performing the following activities:  Hearing? 0  Vision? 0  Difficulty concentrating or making decisions? 0  Walking or climbing stairs? 0  Dressing or bathing? 0  Doing errands, shopping? 0  Preparing Food and eating ? N  Using the Toilet? N  In the past six months, have you accidently leaked urine? N  Do you have problems with loss of bowel control? N  Managing your Medications? N  Managing your Finances? N  Housekeeping or managing your Housekeeping? N    Patient Care Team: Ma Hillock, DO as PCP - General (Family Medicine) Otelia Sergeant, OD as Referring  Physician [facility] (Dentistry)  Indicate any recent Medical Services you may have received from other than [facility] providers in the past year (date may be approximate).     Assessment:   This is a routine wellness examination for [place].  Hearing/Vision screen No results found.  Dietary issues and exercise activities discussed: Current Exercise Habits: Home exercise routine, Type of exercise: walking, Time (Minutes): 20, Frequency (Times/Week): 7, Weekly Exercise (Minutes/Week): 140, Exercise limited by: None identified   Goals Addressed   None   Depression Screen    04/02/2022    1:13 PM 03/29/2021    9:44 AM 02/10/2021    4:07 PM 03/23/2020    9:03 AM 10/15/2018    9:42 AM 04/10/2018   10:07 AM 04/26/2017    8:34 AM  PHQ 2/9 Scores  PHQ - 2 Score 0 0 0 0 0 0 0  PHQ- 9 Score      0     Fall Risk    04/02/2022    1:24 PM 03/29/2021    9:48 AM 02/10/2021    4:05 PM 03/23/2020    9:00 AM 10/15/2018    9:37 AM  [place] in the past year? 0 0 1 0 1  Number falls in past yr: 0 0 0 0 0  Injury with Fall? 0 0 0 0 0  Risk for fall due to :  Impaired vision;Impaired balance/gait Impaired vision  History of fall(s);Impaired vision;Medication side effect  Risk for fall due to: Comment  at times balance     Follow up Falls evaluation completed Falls prevention discussed Falls evaluation completed Falls prevention discussed Falls evaluation completed;Education provided;Falls prevention discussed    FALL RISK PREVENTION PERTAINING TO THE HOME:  Any stairs in or around the home? Yes  If so, are there any without handrails? No  Home free of loose throw rugs in walkways, pet beds, electrical cords, etc? Yes  Adequate lighting in your home to reduce risk of falls? Yes   ASSISTIVE DEVICES UTILIZED TO PREVENT FALLS:  Life alert? No  Use of a cane, walker or w/c? No  Grab bars in the bathroom? Yes  Shower chair or bench in shower? Yes  Elevated toilet seat or a handicapped toilet?  Yes   TIMED UP AND GO:  Was the test performed? No .  Length of time to ambulate 10 feet: n/a sec.     Cognitive Function:        04/02/2022    1:24 PM 03/29/2021    9:54 AM  6CIT Screen  What Year? 0 points 0 points  What month? 0 points 0 points  What time? 0 points 0 points  Count back from 20 0 points 0 points  Months in reverse 2 points 4 points  Repeat phrase 10 points 6 points  Total Score 12 points 10 points    Immunizations Immunization History  Administered Date(s) Administered   Fluad Quad(high Dose 65+) 10/15/2018, 11/02/2019, 10/31/2020, 10/12/2021   Influenza, High Dose Seasonal PF 01/10/2016, 11/08/2016, 11/27/2017   Influenza-Unspecified 10/27/2014, 11/08/2016, 05/17/2020   PFIZER(Purple Top)SARS-COV-2 Vaccination 04/20/2019, 05/25/2019, 12/03/2019   Pneumococcal Conjugate-13 10/27/2014   Pneumococcal Polysaccharide-23 05/17/2020   Pneumococcal-Unspecified 05/17/2020   Tdap 05/17/2020   Zoster Recombinat (Shingrix) 09/20/2017, 01/05/2018    TDAP status: Up to date  Flu Vaccine status: Up to date  Pneumococcal vaccine status: Up to date  Covid-19 vaccine status: Completed vaccines  Qualifies for Shingles Vaccine? Yes   Zostavax completed No   Shingrix Completed?: Yes  Screening Tests Health Maintenance  Topic Date Due   Medicare Annual Wellness (AWV)  04/03/2023   DTaP/Tdap/Td (2 - Td or Tdap) 05/18/2030   Pneumonia Vaccine 8+ Years old  Completed   INFLUENZA VACCINE  Completed   DEXA SCAN  Completed   Zoster Vaccines- Shingrix  Completed   HPV VACCINES  Aged Out   COVID-19 Vaccine  Discontinued    Health Maintenance  There are no preventive care reminders to display for this patient.   Colorectal cancer screening: No longer required.   Mammogram status: No longer required due to age.  Bone Density status: Completed 02/05/2010. Results reflect: Bone density results: NORMAL. Repeat every n/a years.  Lung Cancer Screening: (Low  Dose CT Chest recommended if Age 57-80 years, 30 pack-year currently smoking OR have quit w/in 15years.) does not qualify.   Lung Cancer Screening Referral: n/a  Additional Screening:  Hepatitis C Screening: does not qualify; Completed n/a  Vision Screening: Recommended annual ophthalmology exams for early detection of glaucoma and other disorders of the eye. Is the patient up to date with their annual eye exam?  No  Who is the provider or what is the name of the office in which the patient attends annual eye exams? [facility] If pt is not established with a provider, would they like to be referred to a provider to establish care? No .   Dental Screening: Recommended annual dental exams for proper oral hygiene  Community Resource Referral / Chronic Care Management: CRR required this visit?  No   CCM required this visit?  No      Plan:     I have personally reviewed and noted the following in the patient's chart:   Medical and social history Use of alcohol, tobacco or illicit drugs  Current medications and supplements including opioid prescriptions. Patient is not currently taking opioid prescriptions. Functional ability and status Nutritional status Physical activity Advanced directives List of other physicians Hospitalizations, surgeries, and ER visits in previous 12 months Vitals  Screenings to include cognitive, depression, and falls Referrals and appointments  In addition, I have reviewed and discussed with patient certain preventive protocols, quality metrics, and best practice recommendations. A written personalized care plan for preventive services as well as general preventive health recommendations were provided to patient.     TIMIYA GERGEL, [place]   04/02/2022   Nurse Notes: Non-Face to Face or Face to Face 7 minute visit Encounter    Ms. Porritt , Thank you for taking time to come for your Medicare Wellness Visit. I appreciate your ongoing commitment to your  health goals. Please review the following plan we discussed and let me know if I can assist you in the future.   These are the goals we discussed:  Goals      Patient Stated     Maintain current health by staying active.      Patient Stated     None at this time         This is a list of the screening recommended for you and due dates:  Health Maintenance  Topic Date Due   Medicare Annual Wellness Visit  04/03/2023   DTaP/Tdap/Td vaccine (2 - Td or Tdap) 05/18/2030   Pneumonia Vaccine  Completed   Flu Shot  Completed   DEXA scan (bone density measurement)  Completed   Zoster (Shingles) Vaccine  Completed   HPV Vaccine  Aged Out   COVID-19 Vaccine  Discontinued

## 2022-04-04 ENCOUNTER — Ambulatory Visit: Payer: Medicare HMO

## 2022-04-04 LAB — COMPREHENSIVE METABOLIC PANEL
AG Ratio: 1.4 (calc) (ref 1.0–2.5)
ALT: 12 U/L (ref 6–29)
AST: 15 U/L (ref 10–35)
Albumin: 3.9 g/dL (ref 3.6–5.1)
Alkaline phosphatase (APISO): 135 U/L (ref 37–153)
BUN: 13 mg/dL (ref 7–25)
CO2: 26 mmol/L (ref 20–32)
Calcium: 9.5 mg/dL (ref 8.6–10.4)
Chloride: 104 mmol/L (ref 98–110)
Creat: 0.73 mg/dL (ref 0.60–0.95)
Globulin: 2.7 g/dL (calc) (ref 1.9–3.7)
Glucose, Bld: 83 mg/dL (ref 65–99)
Potassium: 3.9 mmol/L (ref 3.5–5.3)
Sodium: 141 mmol/L (ref 135–146)
Total Bilirubin: 0.6 mg/dL (ref 0.2–1.2)
Total Protein: 6.6 g/dL (ref 6.1–8.1)

## 2022-04-04 LAB — PROTEIN,TOTAL AND ELECTROPHOR W/IFE
Albumin ELP: 4 g/dL (ref 3.8–4.8)
Alpha 1: 0.3 g/dL (ref 0.2–0.3)
Alpha 2: 0.6 g/dL (ref 0.5–0.9)
Beta 2: 0.3 g/dL (ref 0.2–0.5)
Beta Globulin: 0.4 g/dL (ref 0.4–0.6)
Gamma Globulin: 0.8 g/dL (ref 0.8–1.7)
Total Protein: 6.4 g/dL (ref 6.1–8.1)

## 2022-05-15 ENCOUNTER — Ambulatory Visit (INDEPENDENT_AMBULATORY_CARE_PROVIDER_SITE_OTHER): Payer: PPO

## 2022-05-15 DIAGNOSIS — E538 Deficiency of other specified B group vitamins: Secondary | ICD-10-CM | POA: Diagnosis not present

## 2022-05-15 MED ORDER — CYANOCOBALAMIN 1000 MCG/ML IJ SOLN
1000.0000 ug | Freq: Once | INTRAMUSCULAR | Status: AC
  Administered 2022-05-15: 1000 ug via INTRAMUSCULAR

## 2022-05-15 NOTE — Progress Notes (Signed)
Pt here for monthly B12 injection per    B12 1000mcg given IM, and pt tolerated injection well.   Next B12 injection scheduled for 1 month 

## 2022-05-30 DIAGNOSIS — H353132 Nonexudative age-related macular degeneration, bilateral, intermediate dry stage: Secondary | ICD-10-CM | POA: Diagnosis not present

## 2022-07-16 ENCOUNTER — Ambulatory Visit (INDEPENDENT_AMBULATORY_CARE_PROVIDER_SITE_OTHER): Payer: PPO | Admitting: Family Medicine

## 2022-07-16 ENCOUNTER — Encounter: Payer: Self-pay | Admitting: Family Medicine

## 2022-07-16 VITALS — BP 128/75 | HR 79 | Temp 97.5°F | Wt 137.4 lb

## 2022-07-16 DIAGNOSIS — E538 Deficiency of other specified B group vitamins: Secondary | ICD-10-CM

## 2022-07-16 DIAGNOSIS — R300 Dysuria: Secondary | ICD-10-CM | POA: Diagnosis not present

## 2022-07-16 DIAGNOSIS — N3281 Overactive bladder: Secondary | ICD-10-CM

## 2022-07-16 LAB — POC URINALSYSI DIPSTICK (AUTOMATED)
Bilirubin, UA: NEGATIVE
Glucose, UA: NEGATIVE
Nitrite, UA: NEGATIVE
Protein, UA: NEGATIVE
Spec Grav, UA: 1.015 (ref 1.010–1.025)
Urobilinogen, UA: 1 E.U./dL
pH, UA: 6 (ref 5.0–8.0)

## 2022-07-16 MED ORDER — SULFAMETHOXAZOLE-TRIMETHOPRIM 800-160 MG PO TABS: 1.0000 | ORAL_TABLET | Freq: Two times a day (BID) | ORAL | 0 refills | Status: AC

## 2022-07-16 MED ORDER — MIRABEGRON ER 25 MG PO TB24: 25.0000 mg | ORAL_TABLET | Freq: Every day | ORAL | 5 refills | Status: DC

## 2022-07-16 NOTE — Progress Notes (Signed)
Denise Fuller , 06-Sep-1935, 87 y.o., female MRN: [id number] Patient Care Team    Relationship Specialty Notifications Start End  Natalia Leatherwood, DO PCP - General Family Medicine  01/10/16   Jill Side, OD Referring Physician   01/10/16    Comment: ophth- macular degeneration  Darnelle Maffucci  Dentistry  04/26/17     Chief Complaint  Patient presents with   Urinary Frequency     Subjective: Denise Fuller is a 87 y.o. Pt presents for an OV with complaints of urinary frequency of few months duration.   She has had recurrent UTI E.coli infections in the past. Usually responds well to bactrim.      04/02/2022    1:13 PM 03/29/2021    9:44 AM 02/10/2021    4:07 PM 03/23/2020    9:03 AM 10/15/2018    9:42 AM  Depression screen PHQ 2/9  Decreased Interest 0 0 0 0 0  Down, Depressed, Hopeless 0 0 0 0 0  PHQ - 2 Score 0 0 0 0 0    Allergies  Allergen Reactions   Doxycycline     GI upset   Keflex [Cephalexin] Hives and Itching   Iron Rash   Social History   Social History Narrative   Retired. Married to [place]. They have 3 children [place])   Moved from [place] 05/2014.    Has 2 older dogs.    Retired.    Drinks caffeine, takes herbal remedies and dialy vitamin.   Wears her seatbelt, smoke detector in the home   Wears dentures, no assistive devices for walking - independent.    Feels safe in her relationships.    Past Medical History:  Diagnosis Date   Abnormal SPEP 07/16/2019   B12 deficiency 07/16/2019   Bartonella infection    BCC (basal cell carcinoma of skin) 1997   nose   Chickenpox    Chronic bilateral low back pain without sciatica 06/23/2019   Chronic venous insufficiency    Diverticulosis    Dyspnea on exertion 07/10/2019   Elevated alkaline phosphatase level    Elevated LDH    Elevated serum immunoglobulin free light chains 07/21/2019   Environmental allergies    GAD (generalized anxiety disorder)    Goals of care, counseling/discussion  08/28/2019   Groin abscess    culture negative, doxy resolved abscess   Hepatitis A    Hyperlipidemia    Hypertension    Hypothyroidism    Ingrown nail 04/10/2018   Lightheadedness 12/03/2018   Lymphadenopathy, cervical    chronic per pt. (left)   Macular degeneration    Meniere disease    MGUS (monoclonal gammopathy of unknown significance) 08/28/2019   Other fatigue 12/03/2018   Overweight (BMI 25.0-29.9) 04/10/2018   Palpitations 12/03/2018   Thyroid disease    Weakness generalized 12/03/2018   Past Surgical History:  Procedure Laterality Date   ABDOMINAL HYSTERECTOMY  1991   APPENDECTOMY  1967   BASAL CELL CARCINOMA EXCISION  1997   nose   BLEPHAROPLASTY     CATARACT EXTRACTION W/ INTRAOCULAR LENS IMPLANT Bilateral Oct and Nov 2018   CHOLECYSTECTOMY  1967   ORIF WRIST FRACTURE Left 05/18/2020   Family History  Problem Relation Age of Onset   Arthritis Mother    Heart disease Mother    Arthritis Father    Aneurysm Father 42       brain   Alzheimer's disease Father  Thyroid disease Daughter    Stroke Daughter        2020   Atrial fibrillation Son        2020   Allergies as of 07/16/2022       Reactions   Doxycycline    GI upset   Keflex [cephalexin] Hives, Itching   Iron Rash        Medication List        Accurate as of July 16, 2022 10:01 AM. If you have any questions, ask your nurse or doctor.          aspirin EC 81 MG tablet Take 81 mg by mouth daily.   B-12 Compliance Injection 1000 MCG/ML Kit Generic drug: Cyanocobalamin Inject 1,000 mcg as directed every 30 (thirty) days. 08/28/2019 Weekly   FLAX SEED OIL PO Take 1,300 mg by mouth 2 (two) times daily.   levothyroxine 88 MCG tablet Commonly known as: SYNTHROID Take 1 tablet (88 mcg total) by mouth daily before breakfast.   lisinopril 20 MG tablet Commonly known as: ZESTRIL Take 1 tablet (20 mg total) by mouth daily.   mirabegron ER 25 MG Tb24 tablet Commonly known as:  MYRBETRIQ Take 1 tablet (25 mg total) by mouth daily. Started by: Felix Pacini, DO   MULTIPLE VITAMINS/WOMENS PO Take 1 tablet by mouth daily. Unknown strength   PRESERVISION AREDS PO Take by mouth.   sulfamethoxazole-trimethoprim 800-160 MG tablet Commonly known as: BACTRIM DS Take 1 tablet by mouth 2 (two) times daily for 5 days. Started by: Felix Pacini, DO   SYSTANE OP Apply 1 drop to eye as needed (Eye dryness).   Vitamin D3 25 MCG (1000 UT) Caps Take 1 capsule by mouth daily. 2,000 units daily        All past medical history, surgical history, allergies, family history, immunizations andmedications were updated in the EMR today and reviewed under the history and medication portions of their EMR.     Review of Systems  Constitutional:  Negative for chills and fever.  Genitourinary:  Positive for frequency and urgency. Negative for flank pain.   Negative, with the exception of above mentioned in HPI   Objective:  BP 128/75   Pulse 79   Temp (!) 97.5 F (36.4 C)   Wt 137 lb 6.4 oz (62.3 kg)   SpO2 97%   BMI 23.58 kg/m  Body mass index is 23.58 kg/m. Physical Exam Vitals and nursing note reviewed.  Constitutional:      General: She is not in acute distress.    Appearance: Normal appearance. She is normal weight. She is not ill-appearing or toxic-appearing.  HENT:     Head: Normocephalic and atraumatic.  Eyes:     General: No scleral icterus.       Right eye: No discharge.        Left eye: No discharge.     Extraocular Movements: Extraocular movements intact.     Conjunctiva/sclera: Conjunctivae normal.     Pupils: Pupils are equal, round, and reactive to light.  Abdominal:     General: Abdomen is flat. There is no distension.     Palpations: Abdomen is soft. There is no mass.     Tenderness: There is abdominal tenderness. There is no right CVA tenderness, left CVA tenderness, guarding or rebound.     Hernia: No hernia is present.  Skin:    Findings:  No rash.  Neurological:     Mental Status: She is alert and oriented to  person, place, and time. Mental status is at baseline.     Motor: No weakness.     Coordination: Coordination normal.     Gait: Gait normal.  Psychiatric:        Mood and Affect: Mood normal.        Behavior: Behavior normal.        Thought Content: Thought content normal.        Judgment: Judgment normal.     No results found. No results found. Results for orders placed or performed in visit on 07/16/22 (from the past 24 hour(s))  POCT Urinalysis Dipstick (Automated)     Status: Abnormal   Collection Time: 07/16/22  9:41 AM  Result Value Ref Range   Color, UA dark yellow    Clarity, UA clear    Glucose, UA Negative Negative   Bilirubin, UA neg    Ketones, UA +-    Spec Grav, UA 1.015 1.010 - 1.025   Blood, UA 1+    pH, UA 6.0 5.0 - 8.0   Protein, UA Negative Negative   Urobilinogen, UA 1.0 0.2 or 1.0 E.U./dL   Nitrite, UA neg    Leukocytes, UA Moderate (2+) (A) Negative    Assessment/Plan: Denise Fuller is a 87 y.o. female present for OV for  B12 deficiency B12 injection provided today   Dysuria - POCT Urinalysis Dipstick (Automated): +leuks, +bld - Urinalysis w microscopic + reflex cultur - hydrate Bactrim BID x 5 days  Overactive bladder Trial of myrbetriq 25 mg qd  Reviewed expectations re: course of current medical issues. Discussed self-management of symptoms. Outlined signs and symptoms indicating need for more acute intervention. Patient verbalized understanding and all questions were answered. Patient received an After-Visit Summary.    Orders Placed This Encounter  Procedures   Urinalysis w microscopic + reflex cultur   POCT Urinalysis Dipstick (Automated)   Meds ordered this encounter  Medications   sulfamethoxazole-trimethoprim (BACTRIM DS) 800-160 MG tablet    Sig: Take 1 tablet by mouth 2 (two) times daily for 5 days.    Dispense:  10 tablet    Refill:  0    mirabegron ER (MYRBETRIQ) 25 MG TB24 tablet    Sig: Take 1 tablet (25 mg total) by mouth daily.    Dispense:  30 tablet    Refill:  5   Referral Orders  No referral(s) requested today     Note is dictated utilizing voice recognition software. Although note has been proof read prior to signing, occasional typographical errors still can be missed. If any questions arise, please do not hesitate to call for verification.   electronically signed by:  Felix Pacini, DO  [facility] [place]

## 2022-07-19 ENCOUNTER — Telehealth: Payer: Self-pay

## 2022-07-19 LAB — URINALYSIS W MICROSCOPIC + REFLEX CULTURE
Bacteria, UA: NONE SEEN /HPF
Bilirubin Urine: NEGATIVE
Glucose, UA: NEGATIVE
Hgb urine dipstick: NEGATIVE
Hyaline Cast: NONE SEEN /LPF
Ketones, ur: NEGATIVE
Nitrites, Initial: NEGATIVE
Specific Gravity, Urine: 1.021 (ref 1.001–1.035)
Squamous Epithelial / HPF: NONE SEEN /HPF (ref <=5)
pH: 8.5 — ABNORMAL HIGH (ref 5.0–8.0)

## 2022-07-19 LAB — URINE CULTURE
MICRO NUMBER:: 15065922
SPECIMEN QUALITY:: ADEQUATE

## 2022-07-19 LAB — CULTURE INDICATED

## 2022-07-19 NOTE — Telephone Encounter (Signed)
This is a very good medicine and rarely has side effect of increased blood pressure, and if so which usually at the higher doses. Other options have common side effects that would not be ideal for her.   Her  urine culture just returned with a bacteria called protease.  The Bactrim prescribed will treat this appropriately. She could decide to wait on starting the Myrbetriq daily medication, and see if her symptoms improve after taking the Bactrim-since she did have a bacterial infection this time causing those symptoms.

## 2022-07-19 NOTE — Telephone Encounter (Signed)
Patient has questions about medication. About side effects regarding high blood pressure.  mirabegron ER (MYRBETRIQ) 25 MG TB24 tablet   Please call patient [phone]

## 2022-07-19 NOTE — Telephone Encounter (Signed)
Spoke with patient regarding results/recommendations.  

## 2022-08-24 ENCOUNTER — Encounter: Payer: Self-pay | Admitting: Family Medicine

## 2022-08-24 ENCOUNTER — Ambulatory Visit (INDEPENDENT_AMBULATORY_CARE_PROVIDER_SITE_OTHER): Payer: PPO | Admitting: Family Medicine

## 2022-08-24 VITALS — BP 138/83 | HR 77 | Temp 97.9°F | Wt 136.0 lb

## 2022-08-24 DIAGNOSIS — R300 Dysuria: Secondary | ICD-10-CM

## 2022-08-24 DIAGNOSIS — R3129 Other microscopic hematuria: Secondary | ICD-10-CM | POA: Diagnosis not present

## 2022-08-24 DIAGNOSIS — A498 Other bacterial infections of unspecified site: Secondary | ICD-10-CM | POA: Diagnosis not present

## 2022-08-24 DIAGNOSIS — E538 Deficiency of other specified B group vitamins: Secondary | ICD-10-CM | POA: Diagnosis not present

## 2022-08-24 DIAGNOSIS — N3281 Overactive bladder: Secondary | ICD-10-CM | POA: Diagnosis not present

## 2022-08-24 LAB — POC URINALSYSI DIPSTICK (AUTOMATED)
Bilirubin, UA: NEGATIVE
Glucose, UA: NEGATIVE
Ketones, UA: NEGATIVE
Nitrite, UA: NEGATIVE
Protein, UA: POSITIVE — AB
Spec Grav, UA: 1.015 (ref 1.010–1.025)
Urobilinogen, UA: 0.2 E.U./dL
pH, UA: 6 (ref 5.0–8.0)

## 2022-08-24 MED ORDER — SULFAMETHOXAZOLE-TRIMETHOPRIM 800-160 MG PO TABS: 1.0000 | ORAL_TABLET | Freq: Two times a day (BID) | ORAL | 0 refills | Status: DC

## 2022-08-24 MED ORDER — CYANOCOBALAMIN 1000 MCG/ML IJ SOLN
1000.0000 ug | Freq: Once | INTRAMUSCULAR | Status: AC
Start: 2022-08-24 — End: 2022-10-02
  Administered 2022-10-02: 1000 ug via INTRAMUSCULAR

## 2022-08-24 NOTE — Patient Instructions (Addendum)
Return if symptoms worsen or fail to improve.        Great to see you today.  I have refilled the medication(s) we provide.   If labs were collected, we will inform you of lab results once received either by echart message or telephone call.   - echart message- for normal results that have been seen by the patient already.   - telephone call: abnormal results or if patient has not viewed results in their echart.  

## 2022-08-24 NOTE — Progress Notes (Signed)
Denise Fuller , 07/17/1935, 87 y.o., female MRN: [id number] Patient Care Team    Relationship Specialty Notifications Start End  Natalia Leatherwood, DO PCP - General Family Medicine  01/10/16   Jill Side, OD Referring Physician   01/10/16    Comment: ophth- macular degeneration  Darnelle Maffucci  Dentistry  04/26/17     Chief Complaint  Patient presents with   Urinary Frequency    Just wants to check      Subjective: Denise Fuller is a 87 y.o. Pt presents for an OV with complaints of urinary frequency of few months duration.   She has had recurrent UTI E.coli infections in the past. Usually responds well to bactrim.  Last UTI: proteus- treated with bactrim Started mirabegron 25 mg 6 weeks ago.      04/02/2022    1:13 PM 03/29/2021    9:44 AM 02/10/2021    4:07 PM 03/23/2020    9:03 AM 10/15/2018    9:42 AM  Depression screen PHQ 2/9  Decreased Interest 0 0 0 0 0  Down, Depressed, Hopeless 0 0 0 0 0  PHQ - 2 Score 0 0 0 0 0    Allergies  Allergen Reactions   Doxycycline     GI upset   Keflex [Cephalexin] Hives and Itching   Iron Rash   Social History   Social History Narrative   Retired. Married to [place]. They have 3 children [place])   Moved from [place] 05/2014.    Has 2 older dogs.    Retired.    Drinks caffeine, takes herbal remedies and dialy vitamin.   Wears her seatbelt, smoke detector in the home   Wears dentures, no assistive devices for walking - independent.    Feels safe in her relationships.    Past Medical History:  Diagnosis Date   Abnormal SPEP 07/16/2019   B12 deficiency 07/16/2019   Bartonella infection    BCC (basal cell carcinoma of skin) 1997   nose   Chickenpox    Chronic bilateral low back pain without sciatica 06/23/2019   Chronic venous insufficiency    Diverticulosis    Dyspnea on exertion 07/10/2019   Elevated alkaline phosphatase level    Elevated LDH    Elevated serum immunoglobulin free light chains 07/21/2019    Environmental allergies    GAD (generalized anxiety disorder)    Goals of care, counseling/discussion 08/28/2019   Groin abscess    culture negative, doxy resolved abscess   Hepatitis A    Hyperlipidemia    Hypertension    Hypothyroidism    Ingrown nail 04/10/2018   Lightheadedness 12/03/2018   Lymphadenopathy, cervical    chronic per pt. (left)   Macular degeneration    Meniere disease    MGUS (monoclonal gammopathy of unknown significance) 08/28/2019   Other fatigue 12/03/2018   Overweight (BMI 25.0-29.9) 04/10/2018   Palpitations 12/03/2018   Thyroid disease    Weakness generalized 12/03/2018   Past Surgical History:  Procedure Laterality Date   ABDOMINAL HYSTERECTOMY  1991   APPENDECTOMY  1967   BASAL CELL CARCINOMA EXCISION  1997   nose   BLEPHAROPLASTY     CATARACT EXTRACTION W/ INTRAOCULAR LENS IMPLANT Bilateral Oct and Nov 2018   CHOLECYSTECTOMY  1967   ORIF WRIST FRACTURE Left 05/18/2020   Family History  Problem Relation Age of Onset   Arthritis Mother    Heart disease Mother    Arthritis Father  Aneurysm Father 32       brain   Alzheimer's disease Father    Thyroid disease Daughter    Stroke Daughter        2020   Atrial fibrillation Son        2020   Allergies as of 08/24/2022       Reactions   Doxycycline    GI upset   Keflex [cephalexin] Hives, Itching   Iron Rash        Medication List        Accurate as of August 24, 2022 11:45 AM. If you have any questions, ask your nurse or doctor.          aspirin EC 81 MG tablet Take 81 mg by mouth daily.   B-12 Compliance Injection 1000 MCG/ML Kit Generic drug: Cyanocobalamin Inject 1,000 mcg as directed every 30 (thirty) days. 08/28/2019 Weekly   FLAX SEED OIL PO Take 1,300 mg by mouth 2 (two) times daily.   levothyroxine 88 MCG tablet Commonly known as: SYNTHROID Take 1 tablet (88 mcg total) by mouth daily before breakfast.   lisinopril 20 MG tablet Commonly known as: ZESTRIL Take 1  tablet (20 mg total) by mouth daily.   mirabegron ER 25 MG Tb24 tablet Commonly known as: MYRBETRIQ Take 1 tablet (25 mg total) by mouth daily.   MULTIPLE VITAMINS/WOMENS PO Take 1 tablet by mouth daily. Unknown strength   PRESERVISION AREDS PO Take by mouth.   sulfamethoxazole-trimethoprim 800-160 MG tablet Commonly known as: BACTRIM DS Take 1 tablet by mouth 2 (two) times daily. Started by: Tamala Bari OP Apply 1 drop to eye as needed (Eye dryness).   Vitamin D3 25 MCG (1000 UT) Caps Take 1 capsule by mouth daily. 2,000 units daily        All past medical history, surgical history, allergies, family history, immunizations andmedications were updated in the EMR today and reviewed under the history and medication portions of their EMR.     Review of Systems  Constitutional:  Negative for chills and fever.  Genitourinary:  Positive for frequency and urgency. Negative for flank pain.   Negative, with the exception of above mentioned in HPI   Objective:  BP 138/83   Pulse 77   Temp 97.9 F (36.6 C)   Wt 136 lb (61.7 kg)   SpO2 97%   BMI 23.34 kg/m  Body mass index is 23.34 kg/m. Physical Exam Vitals and nursing note reviewed.  Constitutional:      General: She is not in acute distress.    Appearance: Normal appearance. She is normal weight. She is not ill-appearing or toxic-appearing.  HENT:     Head: Normocephalic and atraumatic.  Eyes:     General: No scleral icterus.       Right eye: No discharge.        Left eye: No discharge.     Extraocular Movements: Extraocular movements intact.     Conjunctiva/sclera: Conjunctivae normal.     Pupils: Pupils are equal, round, and reactive to light.  Cardiovascular:     Rate and Rhythm: Normal rate and regular rhythm.  Abdominal:     General: Abdomen is flat. There is no distension.     Palpations: Abdomen is soft. There is no mass.     Tenderness: There is no abdominal tenderness. There is no right CVA  tenderness, left CVA tenderness, guarding or rebound.     Hernia: No hernia is present.  Musculoskeletal:     Right lower leg: No edema.     Left lower leg: No edema.  Skin:    Findings: No rash.  Neurological:     Mental Status: She is alert and oriented to person, place, and time. Mental status is at baseline.     Motor: No weakness.     Coordination: Coordination normal.     Gait: Gait normal.  Psychiatric:        Mood and Affect: Mood normal.        Behavior: Behavior normal.        Thought Content: Thought content normal.        Judgment: Judgment normal.     No results found. No results found. Results for orders placed or performed in visit on 08/24/22 (from the past 24 hour(s))  POCT Urinalysis Dipstick (Automated)     Status: Abnormal   Collection Time: 08/24/22 11:25 AM  Result Value Ref Range   Color, UA yellow    Clarity, UA hazy    Glucose, UA Negative Negative   Bilirubin, UA neg    Ketones, UA neg    Spec Grav, UA 1.015 1.010 - 1.025   Blood, UA 2+    pH, UA 6.0 5.0 - 8.0   Protein, UA Positive (A) Negative   Urobilinogen, UA 0.2 0.2 or 1.0 E.U./dL   Nitrite, UA neg    Leukocytes, UA Large (3+) (A) Negative     Assessment/Plan: Denise Fuller is a 87 y.o. female present for OV for  B12 deficiency B12 injection provided today - cyanocobalamin (VITAMIN B12) injection 1,000 mcg   Dysuria/Overactive bladder/microscopic hematuria/Proteus infection - POCT Urinalysis Dipstick (Automated): 2+ bld, large leuks 3+ - Urinalysis w microscopic + reflex cultur sent - hydrate Bactrim BID x 7 days - Ambulatory referral to Urology Trial of myrbetriq 25 mg every day> pt dos not want to continue to take med or increase.    Reviewed expectations re: course of current medical issues. Discussed self-management of symptoms. Outlined signs and symptoms indicating need for more acute intervention. Patient verbalized understanding and all questions were  answered. Patient received an After-Visit Summary.    Orders Placed This Encounter  Procedures   Urinalysis w microscopic + reflex cultur   Ambulatory referral to Urology   POCT Urinalysis Dipstick (Automated)   Meds ordered this encounter  Medications   cyanocobalamin (VITAMIN B12) injection 1,000 mcg   sulfamethoxazole-trimethoprim (BACTRIM DS) 800-160 MG tablet    Sig: Take 1 tablet by mouth 2 (two) times daily.    Dispense:  14 tablet    Refill:  0   Referral Orders         Ambulatory referral to Urology       Note is dictated utilizing voice recognition software. Although note has been proof read prior to signing, occasional typographical errors still can be missed. If any questions arise, please do not hesitate to call for verification.   electronically signed by:  Felix Pacini, DO  [facility] [place]

## 2022-08-25 LAB — URINALYSIS W MICROSCOPIC + REFLEX CULTURE
Bilirubin Urine: NEGATIVE
Hyaline Cast: NONE SEEN /LPF
Specific Gravity, Urine: 1.016 (ref 1.001–1.035)
Squamous Epithelial / HPF: NONE SEEN /HPF (ref <=5)
WBC, UA: >=60 /HPF — AB (ref 0–5)

## 2022-08-25 LAB — CULTURE INDICATED

## 2022-08-27 ENCOUNTER — Telehealth: Payer: Self-pay | Admitting: Family Medicine

## 2022-08-27 LAB — URINALYSIS W MICROSCOPIC + REFLEX CULTURE
Bacteria, UA: NONE SEEN /HPF
Glucose, UA: NEGATIVE
Ketones, ur: NEGATIVE
Nitrites, Initial: NEGATIVE
pH: 6 (ref 5.0–8.0)

## 2022-08-27 LAB — URINE CULTURE
MICRO NUMBER:: 15225041
SPECIMEN QUALITY:: ADEQUATE

## 2022-08-27 NOTE — Telephone Encounter (Signed)
Please inform pt her urine culture was still positive for proteus UTI.  Take the prescription prescribed and I had placed referral to urology for her to establish for further evaluation.

## 2022-08-27 NOTE — Telephone Encounter (Signed)
Spoke with patient regarding results/recommendations.  

## 2022-09-03 DIAGNOSIS — H353123 Nonexudative age-related macular degeneration, left eye, advanced atrophic without subfoveal involvement: Secondary | ICD-10-CM | POA: Diagnosis not present

## 2022-09-03 DIAGNOSIS — H353112 Nonexudative age-related macular degeneration, right eye, intermediate dry stage: Secondary | ICD-10-CM | POA: Diagnosis not present

## 2022-09-03 DIAGNOSIS — H43813 Vitreous degeneration, bilateral: Secondary | ICD-10-CM | POA: Diagnosis not present

## 2022-09-03 DIAGNOSIS — H35033 Hypertensive retinopathy, bilateral: Secondary | ICD-10-CM | POA: Diagnosis not present

## 2022-09-07 ENCOUNTER — Ambulatory Visit: Payer: PPO | Admitting: Urology

## 2022-09-07 ENCOUNTER — Encounter: Payer: Self-pay | Admitting: Urology

## 2022-09-07 VITALS — BP 128/88 | HR 80 | Ht 63.25 in | Wt 135.0 lb

## 2022-09-07 DIAGNOSIS — Z87440 Personal history of urinary (tract) infections: Secondary | ICD-10-CM | POA: Diagnosis not present

## 2022-09-07 DIAGNOSIS — R3129 Other microscopic hematuria: Secondary | ICD-10-CM | POA: Diagnosis not present

## 2022-09-07 DIAGNOSIS — N3281 Overactive bladder: Secondary | ICD-10-CM

## 2022-09-07 DIAGNOSIS — N390 Urinary tract infection, site not specified: Secondary | ICD-10-CM

## 2022-09-07 MED ORDER — ESTRADIOL 0.1 MG/GM VA CREA: TOPICAL_CREAM | VAGINAL | 12 refills | Status: AC

## 2022-09-07 MED ORDER — SULFAMETHOXAZOLE-TRIMETHOPRIM 400-80 MG PO TABS: 1.0000 | ORAL_TABLET | Freq: Every evening | ORAL | 2 refills | Status: DC

## 2022-09-07 NOTE — Progress Notes (Signed)
Assessment: 1. Recurrent UTI   2. Overactive bladder      Plan: Today had a long and detailed discussion with the patient regarding her recurrent UTIs and lower urinary tract symptoms.  I told her at this time I think we need to focus on UTI prevention and then we can address any residual lower urinary tract symptoms down the road.  UTI prevention strategies discussed in detail today. Patient will begin cranberry supplements as well as a urinary probiotic with lactobacillus. We will also begin estrogen vaginal cream and low-dose antibiotic prophylaxis. Prescriptions provided today for Estrace vaginal cream to use 3 times weekly and regular strength Bactrim to take 1 p.o. nightly.  Patient will return in 6 to 8 weeks for recheck with female exam at that time.  Chief Complaint: No chief complaint on file.   History of Present Illness:  Denise Fuller is a 87 y.o. female who is seen in consultation from [facility], [place] A, DO for evaluation of lower urinary tract symptoms and recurrent urinary tract infection.  Patient states that she has had numerous sporadic UTIs over the years.  Recently however she has had several symptomatic UTIs that are consistent with simple cystitis.  She denies any febrile UTIs or any other constitutional complaints. The patient has had 2 Proteus infections back to back in the last 2 months despite completing appropriate antibiotics.  Her Proteus was resistant to nitrofurantoin.  Patient was also started on Myrbetriq 25 mg approximately 8 weeks ago but admits to not taking regularly.  Prior to her regular UTIs she has had relatively mild to moderate symptoms of OAB.   Past Medical History:  Past Medical History:  Diagnosis Date   Abnormal SPEP 07/16/2019   B12 deficiency 07/16/2019   Bartonella infection    BCC (basal cell carcinoma of skin) 1997   nose   Chickenpox    Chronic bilateral low back pain without sciatica 06/23/2019   Chronic venous  insufficiency    Diverticulosis    Dyspnea on exertion 07/10/2019   Elevated alkaline phosphatase level    Elevated LDH    Elevated serum immunoglobulin free light chains 07/21/2019   Environmental allergies    GAD (generalized anxiety disorder)    Goals of care, counseling/discussion 08/28/2019   Groin abscess    culture negative, doxy resolved abscess   Hepatitis A    Hyperlipidemia    Hypertension    Hypothyroidism    Ingrown nail 04/10/2018   Lightheadedness 12/03/2018   Lymphadenopathy, cervical    chronic per pt. (left)   Macular degeneration    Meniere disease    MGUS (monoclonal gammopathy of unknown significance) 08/28/2019   Other fatigue 12/03/2018   Overweight (BMI 25.0-29.9) 04/10/2018   Palpitations 12/03/2018   Thyroid disease    Weakness generalized 12/03/2018    Past Surgical History:  Past Surgical History:  Procedure Laterality Date   ABDOMINAL HYSTERECTOMY  1991   APPENDECTOMY  1967   BASAL CELL CARCINOMA EXCISION  1997   nose   BLEPHAROPLASTY     CATARACT EXTRACTION W/ INTRAOCULAR LENS IMPLANT Bilateral Oct and Nov 2018   CHOLECYSTECTOMY  1967   ORIF WRIST FRACTURE Left 05/18/2020    Allergies:  Allergies  Allergen Reactions   Doxycycline     GI upset   Keflex [Cephalexin] Hives and Itching   Iron Rash    Family History:  Family History  Problem Relation Age of Onset   Arthritis Mother    Heart  disease Mother    Arthritis Father    Aneurysm Father 76       brain   Alzheimer's disease Father    Thyroid disease Daughter    Stroke Daughter        2020   Atrial fibrillation Son        2020    Social History:  Social History   Tobacco Use   Smoking status: Never   Smokeless tobacco: Never  Vaping Use   Vaping status: Never Used  Substance Use Topics   Alcohol use: No   Drug use: No    Review of symptoms:  Constitutional:  Negative for unexplained weight loss, night sweats, fever, chills ENT:  Negative for nose bleeds, sinus  pain, painful swallowing CV:  Negative for chest pain, shortness of breath, exercise intolerance, palpitations, loss of consciousness Resp:  Negative for cough, wheezing, shortness of breath GI:  Negative for nausea, vomiting, diarrhea, bloody stools GU:  Positives noted in HPI; otherwise negative for gross hematuria, dysuria, urinary incontinence Neuro:  Negative for seizures, poor balance, limb weakness, slurred speech Psych:  Negative for lack of energy, depression, anxiety Endocrine:  Negative for polydipsia, polyuria, symptoms of hypoglycemia (dizziness, hunger, sweating) Hematologic:  Negative for anemia, purpura, petechia, prolonged or excessive bleeding, use of anticoagulants  Allergic:  Negative for difficulty breathing or choking as a result of exposure to anything; no shellfish allergy; no allergic response (rash/itch) to materials, foods  Physical exam: BP 128/88   Pulse 80   Ht 5' 3.25" (1.607 m)   Wt 135 lb (61.2 kg)   BMI 23.73 kg/m  GENERAL APPEARANCE:  Well appearing, well developed, well nourished, NAD   Results: UA today neg for UTI

## 2022-09-10 ENCOUNTER — Telehealth: Payer: Self-pay | Admitting: Urology

## 2022-09-10 LAB — MICROSCOPIC EXAMINATION

## 2022-10-02 ENCOUNTER — Ambulatory Visit (INDEPENDENT_AMBULATORY_CARE_PROVIDER_SITE_OTHER): Payer: PPO

## 2022-10-02 DIAGNOSIS — E538 Deficiency of other specified B group vitamins: Secondary | ICD-10-CM

## 2022-10-02 NOTE — Progress Notes (Addendum)
Pt here for monthly B12 injection per Dr Claiborne Billings  B12 given IM, and pt tolerated injection well.  Next B12 injection scheduled for

## 2022-10-16 ENCOUNTER — Other Ambulatory Visit: Payer: Self-pay | Admitting: Family Medicine

## 2022-10-16 ENCOUNTER — Encounter: Payer: Self-pay | Admitting: Urology

## 2022-10-16 ENCOUNTER — Ambulatory Visit: Payer: PPO | Admitting: Urology

## 2022-10-16 VITALS — BP 172/97 | HR 81

## 2022-10-16 DIAGNOSIS — Z09 Encounter for follow-up examination after completed treatment for conditions other than malignant neoplasm: Secondary | ICD-10-CM

## 2022-10-16 DIAGNOSIS — N390 Urinary tract infection, site not specified: Secondary | ICD-10-CM

## 2022-10-16 DIAGNOSIS — Z87440 Personal history of urinary (tract) infections: Secondary | ICD-10-CM

## 2022-10-16 DIAGNOSIS — R399 Unspecified symptoms and signs involving the genitourinary system: Secondary | ICD-10-CM

## 2022-10-16 DIAGNOSIS — N3281 Overactive bladder: Secondary | ICD-10-CM | POA: Diagnosis not present

## 2022-10-16 LAB — URINALYSIS, ROUTINE W REFLEX MICROSCOPIC
Bilirubin, UA: NEGATIVE
Glucose, UA: NEGATIVE
Ketones, UA: NEGATIVE
Nitrite, UA: NEGATIVE
Protein,UA: NEGATIVE
Specific Gravity, UA: 1.015 (ref 1.005–1.030)
Urobilinogen, Ur: 0.2 mg/dL (ref 0.2–1.0)
pH, UA: 5.5 (ref 5.0–7.5)

## 2022-10-16 LAB — MICROSCOPIC EXAMINATION

## 2022-10-16 NOTE — Progress Notes (Signed)
Assessment: 1. Recurrent UTI   2. Overactive bladder     Plan: Urine for culture today. Continue UTI prevention measures.  She will continue low-dose Bactrim for another month.  I have encouraged her to use the estrogen vaginal cream and we also reviewed the other UTI prevention measures discussed at her initial visit.  Follow-up 3 months or sooner if problems arise On follow-up, if she has persistent microscopic hematuria will recommend further evaluation.  Chief Complaint: Recurrent UTI  HPI: Denise Fuller is a 87 y.o. female who presents for continued evaluation of recurrent UTI and lower urinary tract symptoms most consistent with OAB. Please see my note dated 09/07/2022 at the time of initial visit for detailed history exam and plan. Patient presents here today for follow-up.  She has done well without any breakthrough infection.  She has incorporated most of the UTI prevention recommendations and has been taking low-dose Bactrim suppression.  Unfortunately, she admits to not using the estrogen vaginal cream regularly.  I discussed with her how important this was to change the vaginal environment to make it more resistant to UTI.   Portions of the above documentation were copied from a prior visit for review purposes only.  Allergies: Allergies  Allergen Reactions   Doxycycline     GI upset   Keflex [Cephalexin] Hives and Itching   Iron Rash    PMH: Past Medical History:  Diagnosis Date   Abnormal SPEP 07/16/2019   B12 deficiency 07/16/2019   Bartonella infection    BCC (basal cell carcinoma of skin) 1997   nose   Chickenpox    Chronic bilateral low back pain without sciatica 06/23/2019   Chronic venous insufficiency    Diverticulosis    Dyspnea on exertion 07/10/2019   Elevated alkaline phosphatase level    Elevated LDH    Elevated serum immunoglobulin free light chains 07/21/2019   Environmental allergies    GAD (generalized anxiety disorder)    Goals of care,  counseling/discussion 08/28/2019   Groin abscess    culture negative, doxy resolved abscess   Hepatitis A    Hyperlipidemia    Hypertension    Hypothyroidism    Ingrown nail 04/10/2018   Lightheadedness 12/03/2018   Lymphadenopathy, cervical    chronic per pt. (left)   Macular degeneration    Meniere disease    MGUS (monoclonal gammopathy of unknown significance) 08/28/2019   Other fatigue 12/03/2018   Overweight (BMI 25.0-29.9) 04/10/2018   Palpitations 12/03/2018   Thyroid disease    Weakness generalized 12/03/2018    PSH: Past Surgical History:  Procedure Laterality Date   ABDOMINAL HYSTERECTOMY  1991   APPENDECTOMY  1967   BASAL CELL CARCINOMA EXCISION  1997   nose   BLEPHAROPLASTY     CATARACT EXTRACTION W/ INTRAOCULAR LENS IMPLANT Bilateral Oct and Nov 2018   CHOLECYSTECTOMY  1967   ORIF WRIST FRACTURE Left 05/18/2020    SH: Social History   Tobacco Use   Smoking status: Never   Smokeless tobacco: Never  Vaping Use   Vaping status: Never Used  Substance Use Topics   Alcohol use: No   Drug use: No    ROS: Constitutional:  Negative for fever, chills, weight loss CV: Negative for chest pain, previous MI, hypertension Respiratory:  Negative for shortness of breath, wheezing, sleep apnea, frequent cough GI:  Negative for nausea, vomiting, bloody stool, GERD  PE: BP (!) 172/97   Pulse 81  GENERAL APPEARANCE:  Well appearing, well  developed, well nourished, NAD  GU: Prominent atrophic vaginitis with midline cystocele that approaches the introitus.  There is also a small urethral caruncle present.   Results: UA today continues to show microscopic hematuria, pyuria and few bacteria.

## 2022-10-18 LAB — URINE CULTURE

## 2022-11-26 ENCOUNTER — Other Ambulatory Visit: Payer: Self-pay

## 2022-11-26 DIAGNOSIS — E039 Hypothyroidism, unspecified: Secondary | ICD-10-CM

## 2022-11-26 MED ORDER — LEVOTHYROXINE SODIUM 88 MCG PO TABS
88.0000 ug | ORAL_TABLET | Freq: Every day | ORAL | 0 refills | Status: DC
Start: 2022-11-26 — End: 2022-12-21

## 2022-11-26 NOTE — Telephone Encounter (Signed)
RF request for levothyroxine (SYNTHROID) 88 MCG tablet  LOV: 08/24/22 Next ov: [facility] overdue (August) Last written: 10/13/21 (90,3)  Pt given 30 day supply, sent mychart message to schedule with provider

## 2022-12-04 ENCOUNTER — Other Ambulatory Visit: Payer: Self-pay | Admitting: Family Medicine

## 2022-12-04 DIAGNOSIS — E039 Hypothyroidism, unspecified: Secondary | ICD-10-CM

## 2022-12-20 ENCOUNTER — Ambulatory Visit (INDEPENDENT_AMBULATORY_CARE_PROVIDER_SITE_OTHER): Payer: PPO | Admitting: Family Medicine

## 2022-12-20 ENCOUNTER — Encounter: Payer: Self-pay | Admitting: Family Medicine

## 2022-12-20 VITALS — BP 138/82 | HR 61 | Temp 97.4°F | Wt 138.6 lb

## 2022-12-20 DIAGNOSIS — I10 Essential (primary) hypertension: Secondary | ICD-10-CM

## 2022-12-20 DIAGNOSIS — E039 Hypothyroidism, unspecified: Secondary | ICD-10-CM

## 2022-12-20 DIAGNOSIS — N1831 Chronic kidney disease, stage 3a: Secondary | ICD-10-CM | POA: Diagnosis not present

## 2022-12-20 DIAGNOSIS — E782 Mixed hyperlipidemia: Secondary | ICD-10-CM

## 2022-12-20 DIAGNOSIS — R768 Other specified abnormal immunological findings in serum: Secondary | ICD-10-CM

## 2022-12-20 DIAGNOSIS — D472 Monoclonal gammopathy: Secondary | ICD-10-CM | POA: Diagnosis not present

## 2022-12-20 DIAGNOSIS — E538 Deficiency of other specified B group vitamins: Secondary | ICD-10-CM | POA: Diagnosis not present

## 2022-12-20 DIAGNOSIS — N3281 Overactive bladder: Secondary | ICD-10-CM | POA: Diagnosis not present

## 2022-12-20 DIAGNOSIS — R7689 Other specified abnormal immunological findings in serum: Secondary | ICD-10-CM

## 2022-12-20 DIAGNOSIS — Z23 Encounter for immunization: Secondary | ICD-10-CM | POA: Diagnosis not present

## 2022-12-20 LAB — COMPREHENSIVE METABOLIC PANEL
ALT: 11 U/L (ref 0–35)
AST: 16 U/L (ref 0–37)
Albumin: 4 g/dL (ref 3.5–5.2)
Alkaline Phosphatase: 140 U/L — ABNORMAL HIGH (ref 39–117)
BUN: 12 mg/dL (ref 6–23)
CO2: 29 meq/L (ref 19–32)
Calcium: 9.1 mg/dL (ref 8.4–10.5)
Chloride: 105 meq/L (ref 96–112)
Creatinine, Ser: 0.78 mg/dL (ref 0.40–1.20)
GFR: 69.45 mL/min (ref >60.00)
Glucose, Bld: 80 mg/dL (ref 70–99)
Potassium: 4.1 meq/L (ref 3.5–5.1)
Sodium: 139 meq/L (ref 135–145)
Total Bilirubin: 0.3 mg/dL (ref 0.2–1.2)
Total Protein: 6.5 g/dL (ref 6.0–8.3)

## 2022-12-20 LAB — CBC
HCT: 40.3 % (ref 36.0–46.0)
Hemoglobin: 13.1 g/dL (ref 12.0–15.0)
MCHC: 32.6 g/dL (ref 30.0–36.0)
MCV: 93.7 fL (ref 78.0–100.0)
Platelets: 279 10*3/uL (ref 150.0–400.0)
RBC: 4.3 Mil/uL (ref 3.87–5.11)
RDW: 15.1 % (ref 11.5–15.5)
WBC: 6.6 10*3/uL (ref 4.0–10.5)

## 2022-12-20 LAB — T3, FREE: T3, Free: 2.9 pg/mL (ref 2.3–4.2)

## 2022-12-20 LAB — T4, FREE: Free T4: 0.92 ng/dL (ref 0.60–1.60)

## 2022-12-20 LAB — TSH: TSH: 3.6 u[IU]/mL (ref 0.35–5.50)

## 2022-12-20 MED ORDER — LISINOPRIL 20 MG PO TABS: 20.0000 mg | ORAL_TABLET | Freq: Every day | ORAL | 1 refills | Status: DC

## 2022-12-20 MED ORDER — MIRABEGRON ER 25 MG PO TB24: 25.0000 mg | ORAL_TABLET | Freq: Every day | ORAL | 5 refills | Status: DC

## 2022-12-20 MED ORDER — CYANOCOBALAMIN 1000 MCG/ML IJ SOLN
1000.0000 ug | Freq: Once | INTRAMUSCULAR | Status: AC
Start: 2022-12-20 — End: 2023-02-21
  Administered 2023-02-21: 1000 ug via INTRAMUSCULAR

## 2022-12-20 NOTE — Progress Notes (Signed)
Denise Fuller , 11-01-1935, 87 y.o., female MRN: [id number] Patient Care Team    Relationship Specialty Notifications Start End  Natalia Leatherwood, DO PCP - General Family Medicine  01/10/16   Jill Side, OD Referring Physician   01/10/16    Comment: ophth- macular degeneration  Darnelle Maffucci  Dentistry  04/26/17   Joline Maxcy, MD Referring Physician Urology  12/20/22     Chief Complaint  Patient presents with   Hypothyroidism   Hypertension     Subjective: Denise Fuller is a 87 y.o. female present for Chronic Conditions/illness Management All past medical history, surgical history, allergies, family history, immunizations and social history was obtained from the patient today and entered into the electronic medical record.   Essential hypertension/HLD Pt reports compliance with lisinopril 20  mg a day.  Patient denies chest pain, shortness of breath, dizziness or lower extremity edema.   Pt takesdaily baby ASA.   Hypothyroid: Labs due today.  Patient compliant with levothyroxine 88 mcg daily on an empty stomach.  Cognitive decline/b12 def: She has received B12 injections as indicated and has noticed an improvement.  Due for injection today. Prior note: Pt presents for an OV with her daughter today to discuss cognitive changes that have been noticed over the last year. Pt reports she has noticed she is more forgetful of appts and details of conversations. Her daughter reports the family noticed changes a little of a year ago. Started with forgetting having conversations. Daughter reports last couple of months there has been changes in her cooking, forgetting appts, even forgetting she wrote down appts, more details of conversations. When she is told she forgot she has started to become more "elevated." She has h/o b12 def and thyroid disorder. She reports compliance with levothyroxine and monthly b12 inj.  She has had a h/o of abnormal spep, which has been stable. No h/o   stroke, but endorses today her eye doctor told her she was having "mini-strokes of her eye." She takes a baby ASA and BP is well controlled on medication.      04/02/2022    1:13 PM 03/29/2021    9:44 AM 02/10/2021    4:07 PM 03/23/2020    9:03 AM 10/15/2018    9:42 AM  Depression screen PHQ 2/9  Decreased Interest 0 0 0 0 0  Down, Depressed, Hopeless 0 0 0 0 0  PHQ - 2 Score 0 0 0 0 0    Allergies  Allergen Reactions   Doxycycline     GI upset   Keflex [Cephalexin] Hives and Itching   Iron Rash   Social History   Social History Narrative   Retired. Married to [place]. They have 3 children [place])   Moved from [place] 05/2014.    Has 2 older dogs.    Retired.    Drinks caffeine, takes herbal remedies and dialy vitamin.   Wears her seatbelt, smoke detector in the home   Wears dentures, no assistive devices for walking - independent.    Feels safe in her relationships.    Past Medical History:  Diagnosis Date   Abnormal SPEP 07/16/2019   B12 deficiency 07/16/2019   Bartonella infection    BCC (basal cell carcinoma of skin) 1997   nose   Chickenpox    Chronic bilateral low back pain without sciatica 06/23/2019   Chronic venous insufficiency    Diverticulosis    Dyspnea on exertion 07/10/2019  Elevated alkaline phosphatase level    Elevated LDH    Elevated serum immunoglobulin free light chains 07/21/2019   Environmental allergies    GAD (generalized anxiety disorder)    Goals of care, counseling/discussion 08/28/2019   Groin abscess    culture negative, doxy resolved abscess   Hepatitis A    Hyperlipidemia    Hypertension    Hypothyroidism    Ingrown nail 04/10/2018   Lightheadedness 12/03/2018   Lymphadenopathy, cervical    chronic per pt. (left)   Macular degeneration    Meniere disease    MGUS (monoclonal gammopathy of unknown significance) 08/28/2019   Other fatigue 12/03/2018   Overweight (BMI 25.0-29.9) 04/10/2018   Palpitations 12/03/2018   Thyroid  disease    Weakness generalized 12/03/2018   Past Surgical History:  Procedure Laterality Date   ABDOMINAL HYSTERECTOMY  1991   APPENDECTOMY  1967   BASAL CELL CARCINOMA EXCISION  1997   nose   BLEPHAROPLASTY     CATARACT EXTRACTION W/ INTRAOCULAR LENS IMPLANT Bilateral Oct and Nov 2018   CHOLECYSTECTOMY  1967   ORIF WRIST FRACTURE Left 05/18/2020   Family History  Problem Relation Age of Onset   Arthritis Mother    Heart disease Mother    Arthritis Father    Aneurysm Father 82       brain   Alzheimer's disease Father    Thyroid disease Daughter    Stroke Daughter        2020   Atrial fibrillation Son        2020    All past medical history, surgical history, allergies, family history, immunizations andmedications were updated in the EMR today and reviewed under the history and medication portions of their EMR.     ROS: Negative, with the exception of above mentioned in HPI   Objective:  BP 138/82   Pulse 61   Temp (!) 97.4 F (36.3 C)   Wt 138 lb 9.6 oz (62.9 kg)   SpO2 99%   BMI 24.36 kg/m  Body mass index is 24.36 kg/m. Physical Exam Vitals and nursing note reviewed.  Constitutional:      General: She is not in acute distress.    Appearance: Normal appearance. She is not ill-appearing, toxic-appearing or diaphoretic.  HENT:     Head: Normocephalic and atraumatic.     Mouth/Throat:     Mouth: Mucous membranes are moist.  Eyes:     General: No scleral icterus.       Right eye: No discharge.        Left eye: No discharge.     Extraocular Movements: Extraocular movements intact.     Conjunctiva/sclera: Conjunctivae normal.     Pupils: Pupils are equal, round, and reactive to light.  Cardiovascular:     Rate and Rhythm: Normal rate and regular rhythm.     Heart sounds: No murmur heard. Pulmonary:     Effort: Pulmonary effort is normal. No respiratory distress.     Breath sounds: Normal breath sounds. No wheezing, rhonchi or rales.  Musculoskeletal:      Cervical back: Neck supple. No tenderness.     Right lower leg: No edema.     Left lower leg: No edema.  Lymphadenopathy:     Cervical: No cervical adenopathy.  Skin:    General: Skin is warm and dry.     Coloration: Skin is not jaundiced or pale.     Findings: No erythema or rash.  Neurological:  Mental Status: She is alert and oriented to person, place, and time. Mental status is at baseline.     Motor: No weakness.     Gait: Gait normal.  Psychiatric:        Mood and Affect: Mood normal.        Behavior: Behavior normal.        Thought Content: Thought content normal.        Judgment: Judgment normal.    No results found. No results found. No results found for this or any previous visit (from the past 24 hour(s)).   Assessment/Plan: Denise Fuller is a 87 y.o. female present for OV condition management HTN/Mixed hyperlipidemia Stable Dc dyazide and use only if swelling is appreciated.  Continue lisinopril 20 mg a day. Do not recommend monitoring any further at home unless dizziness occurs. Anxiety is driving up home pressures.  CBC, CMP collected today  Hypothyroidism due to acquired atrophy of thyroid Continue levothyroxine 88 mcg daily. refills will be provided in appropriate dose based on lab result today TSH, T4 free, T3 free  B12 deficiency Continue B12 injections monthly, indefinitely according to schedule. B12 injection provided today  MGUS (monoclonal gammopathy of unknown significance)/Elevated serum immunoglobulin free light chains/Stage 3a chronic kidney disease (HCC) -Routine SPEP, BMP every 6 months.  If  M spike recurs then will refer to oncology at that time (per onc). -Pap and CMP collected today  Influenza vaccine administered today   Reviewed expectations re: course of current medical issues. Discussed self-management of symptoms. Outlined signs and symptoms indicating need for more acute intervention. Patient verbalized understanding and all  questions were answered. Patient received an After-Visit Summary.   Orders Placed This Encounter  Procedures   Comp Met (CMET)   Protein Electrophoresis, (serum)   CBC   TSH   T4, free   T3, free   Meds ordered this encounter  Medications   cyanocobalamin (VITAMIN B12) injection 1,000 mcg   mirabegron ER (MYRBETRIQ) 25 MG TB24 tablet    Sig: Take 1 tablet (25 mg total) by mouth daily.    Dispense:  30 tablet    Refill:  5   lisinopril (ZESTRIL) 20 MG tablet    Sig: Take 1 tablet (20 mg total) by mouth daily.    Dispense:  90 tablet    Refill:  1    Referral Orders  No referral(s) requested today     Note is dictated utilizing voice recognition software. Although note has been proof read prior to signing, occasional typographical errors still can be missed. If any questions arise, please do not hesitate to call for verification.   electronically signed by:  Felix Pacini, DO  [facility] - [place]

## 2022-12-20 NOTE — Patient Instructions (Addendum)
Return in about 24 weeks (around 06/06/2023) for cpe (20 min), Routine chronic condition follow-up.        Great to see you today.  I have refilled the medication(s) we provide.   If labs were collected or images ordered, we will inform you of  results once we have received them and reviewed. We will contact you either by echart message, or telephone call.  Please give ample time to the testing facility, and our office to run,  receive and review results. Please do not call inquiring of results, even if you can see them in your chart. We will contact you as soon as we are able. If it has been over 1 week since the test was completed, and you have not yet heard from [place], then please call us.    - echart message- for normal results that have been seen by the patient already.   - telephone call: abnormal results or if patient has not viewed results in their echart.  If a referral to a specialist was entered for you, please call us in 2 weeks if you have not heard from the specialist office to schedule.

## 2022-12-21 ENCOUNTER — Other Ambulatory Visit: Payer: Self-pay | Admitting: Family Medicine

## 2022-12-21 DIAGNOSIS — E039 Hypothyroidism, unspecified: Secondary | ICD-10-CM

## 2022-12-21 MED ORDER — LEVOTHYROXINE SODIUM 88 MCG PO TABS: 88.0000 ug | ORAL_TABLET | Freq: Every day | ORAL | 4 refills | Status: DC

## 2022-12-22 LAB — PROTEIN ELECTROPHORESIS, SERUM
Albumin ELP: 3.9 g/dL (ref 3.8–4.8)
Alpha 1: 0.3 g/dL (ref 0.2–0.3)
Alpha 2: 0.7 g/dL (ref 0.5–0.9)
Beta 2: 0.3 g/dL (ref 0.2–0.5)
Beta Globulin: 0.4 g/dL (ref 0.4–0.6)
Gamma Globulin: 0.9 g/dL (ref 0.8–1.7)
Total Protein: 6.6 g/dL (ref 6.1–8.1)

## 2023-01-07 NOTE — Addendum Note (Signed)
Addended by: Filomena Jungling on: 01/07/2023 09:43 AM   Modules accepted: Orders

## 2023-01-15 ENCOUNTER — Ambulatory Visit: Payer: PPO | Admitting: Urology

## 2023-01-15 ENCOUNTER — Encounter: Payer: Self-pay | Admitting: Urology

## 2023-01-15 VITALS — BP 130/84 | HR 70 | Ht 63.75 in | Wt 135.0 lb

## 2023-01-15 DIAGNOSIS — R8281 Pyuria: Secondary | ICD-10-CM

## 2023-01-15 DIAGNOSIS — N390 Urinary tract infection, site not specified: Secondary | ICD-10-CM | POA: Diagnosis not present

## 2023-01-15 DIAGNOSIS — R8271 Bacteriuria: Secondary | ICD-10-CM

## 2023-01-15 DIAGNOSIS — N3281 Overactive bladder: Secondary | ICD-10-CM

## 2023-01-15 NOTE — Progress Notes (Signed)
Assessment: 1. Recurrent UTI   2. Overactive bladder     Plan: Urine for culture today-hold treatment pending culture results May need to change antibiotic suppression regimen based on findings Schedule renal ultrasound-will contact with results Follow-up 3 months or sooner if problems arise  Chief Complaint: Recurrent uti  HPI: Denise Fuller is a 87 y.o. female who presents for continued evaluation of recurrent UTI and lower urinary tract symptoms most compatible with OAB. Please see my note dated 09/07/2022 at the time of initial visit for detailed history exam and plan. Patient presents here today for follow-up.   Patient states that her OAB symptoms seem to be somewhat improved. Patient has incorporated UTI prevention measures including cranberry supplementation, daily probiotic, vaginal estrogen cream, and has been using low-dose Bactrim suppression. Patient reports that she feels well she does not have any UTI symptoms.  Urinalysis today is suspicious for UTI.  It is nitrate positive and does show low-level pyuria as well as bacteriuria.  Will send for culture.   No significant microscopic hematuria   Portions of the above documentation were copied from a prior visit for review purposes only.  Allergies: Allergies  Allergen Reactions   Doxycycline     GI upset   Keflex [Cephalexin] Hives and Itching   Iron Rash    PMH: Past Medical History:  Diagnosis Date   Abnormal SPEP 07/16/2019   B12 deficiency 07/16/2019   Bartonella infection    BCC (basal cell carcinoma of skin) 1997   nose   Chickenpox    Chronic bilateral low back pain without sciatica 06/23/2019   Chronic venous insufficiency    Diverticulosis    Dyspnea on exertion 07/10/2019   Elevated alkaline phosphatase level    Elevated LDH    Elevated serum immunoglobulin free light chains 07/21/2019   Environmental allergies    GAD (generalized anxiety disorder)    Goals of care, counseling/discussion  08/28/2019   Groin abscess    culture negative, doxy resolved abscess   Hepatitis A    Hyperlipidemia    Hypertension    Hypothyroidism    Ingrown nail 04/10/2018   Lightheadedness 12/03/2018   Lymphadenopathy, cervical    chronic per pt. (left)   Macular degeneration    Meniere disease    MGUS (monoclonal gammopathy of unknown significance) 08/28/2019   Other fatigue 12/03/2018   Overweight (BMI 25.0-29.9) 04/10/2018   Palpitations 12/03/2018   Thyroid disease    Weakness generalized 12/03/2018    PSH: Past Surgical History:  Procedure Laterality Date   ABDOMINAL HYSTERECTOMY  1991   APPENDECTOMY  1967   BASAL CELL CARCINOMA EXCISION  1997   nose   BLEPHAROPLASTY     CATARACT EXTRACTION W/ INTRAOCULAR LENS IMPLANT Bilateral Oct and Nov 2018   CHOLECYSTECTOMY  1967   ORIF WRIST FRACTURE Left 05/18/2020    SH: Social History   Tobacco Use   Smoking status: Never   Smokeless tobacco: Never  Vaping Use   Vaping status: Never Used  Substance Use Topics   Alcohol use: No   Drug use: No    ROS: Constitutional:  Negative for fever, chills, weight loss CV: Negative for chest pain, previous MI, hypertension Respiratory:  Negative for shortness of breath, wheezing, sleep apnea, frequent cough GI:  Negative for nausea, vomiting, bloody stool, GERD  PE: Vitals:   01/15/23 0949  BP: 130/84  Pulse: 70    GENERAL APPEARANCE:  Well appearing, well developed, well nourished, NAD  Results: UA is suspicious for UTI being nitrate positive and also showing pyuria and bacteriuria

## 2023-01-16 ENCOUNTER — Telehealth (HOSPITAL_BASED_OUTPATIENT_CLINIC_OR_DEPARTMENT_OTHER): Payer: Self-pay | Admitting: Urology

## 2023-01-16 LAB — URINALYSIS, ROUTINE W REFLEX MICROSCOPIC
Bilirubin, UA: NEGATIVE
Glucose, UA: NEGATIVE
Ketones, UA: NEGATIVE
Nitrite, UA: POSITIVE — AB
Protein,UA: NEGATIVE
Specific Gravity, UA: 1.015 (ref 1.005–1.030)
Urobilinogen, Ur: 0.2 mg/dL (ref 0.2–1.0)
pH, UA: 6 (ref 5.0–7.5)

## 2023-01-16 LAB — MICROSCOPIC EXAMINATION

## 2023-01-21 LAB — URINE CULTURE

## 2023-01-21 MED ORDER — CIPROFLOXACIN HCL 500 MG PO TABS: 500.0000 mg | ORAL_TABLET | Freq: Two times a day (BID) | ORAL | 0 refills | Status: DC

## 2023-01-21 NOTE — Addendum Note (Signed)
Addended by: Joline Maxcy on: 01/21/2023 02:14 PM   Modules accepted: Orders

## 2023-01-23 ENCOUNTER — Ambulatory Visit (HOSPITAL_BASED_OUTPATIENT_CLINIC_OR_DEPARTMENT_OTHER)
Admission: RE | Admit: 2023-01-23 | Discharge: 2023-01-23 | Disposition: A | Discharge status: Home / Self Care | Payer: PPO | Source: Ambulatory Visit | Attending: Urology | Admitting: Urology

## 2023-01-23 DIAGNOSIS — N390 Urinary tract infection, site not specified: Secondary | ICD-10-CM | POA: Insufficient documentation

## 2023-02-20 NOTE — Progress Notes (Signed)
Denise Fuller , 1935-11-01, 88 y.o., female MRN: [id number] Patient Care Team    Relationship Specialty Notifications Start End  Natalia Leatherwood, DO PCP - General Family Medicine  01/10/16   Jill Side, OD Referring Physician   01/10/16    Comment: ophth- macular degeneration  Darnelle Maffucci  Dentistry  04/26/17   Joline Maxcy, MD Referring Physician Urology  12/20/22     Chief Complaint  Patient presents with   Nail Problem    Right foot great toe     Subjective: Denise Fuller is a 88 y.o. Pt presents for an OV with complaints of right medial large toe discomfort intermittently over the last few months.  She reports the medial aspect of her large toenail will cause discomfort, and becomes ingrown.  She then cuts her nails back, but has difficulty cutting her nails back.     04/02/2022    1:13 PM 03/29/2021    9:44 AM 02/10/2021    4:07 PM 03/23/2020    9:03 AM 10/15/2018    9:42 AM  Depression screen PHQ 2/9  Decreased Interest 0 0 0 0 0  Down, Depressed, Hopeless 0 0 0 0 0  PHQ - 2 Score 0 0 0 0 0    Allergies  Allergen Reactions   Doxycycline     GI upset   Keflex [Cephalexin] Hives and Itching   Iron Rash   Social History   Social History Narrative   Retired. Married to Kiester. They have 3 children [place])   Moved from [place] 05/2014.    Has 2 older dogs.    Retired.    Drinks caffeine, takes herbal remedies and dialy vitamin.   Wears her seatbelt, smoke detector in the home   Wears dentures, no assistive devices for walking - independent.    Feels safe in her relationships.    Past Medical History:  Diagnosis Date   Abnormal SPEP 07/16/2019   B12 deficiency 07/16/2019   Bartonella infection    BCC (basal cell carcinoma of skin) 1997   nose   Chickenpox    Chronic bilateral low back pain without sciatica 06/23/2019   Chronic venous insufficiency    Diverticulosis    Dyspnea on exertion 07/10/2019   Elevated alkaline phosphatase  level    Elevated LDH    Elevated serum immunoglobulin free light chains 07/21/2019   Environmental allergies    GAD (generalized anxiety disorder)    Goals of care, counseling/discussion 08/28/2019   Groin abscess    culture negative, doxy resolved abscess   Hepatitis A    Hyperlipidemia    Hypertension    Hypothyroidism    Ingrown nail 04/10/2018   Lightheadedness 12/03/2018   Lymphadenopathy, cervical    chronic per pt. (left)   Macular degeneration    Meniere disease    MGUS (monoclonal gammopathy of unknown significance) 08/28/2019   Other fatigue 12/03/2018   Overweight (BMI 25.0-29.9) 04/10/2018   Palpitations 12/03/2018   Thyroid disease    Weakness generalized 12/03/2018   Past Surgical History:  Procedure Laterality Date   ABDOMINAL HYSTERECTOMY  1991   APPENDECTOMY  1967   BASAL CELL CARCINOMA EXCISION  1997   nose   BLEPHAROPLASTY     CATARACT EXTRACTION W/ INTRAOCULAR LENS IMPLANT Bilateral Oct and Nov 2018   CHOLECYSTECTOMY  1967   ORIF WRIST FRACTURE Left 05/18/2020   Family History  Problem Relation Age of Onset  Arthritis Mother    Heart disease Mother    Arthritis Father    Aneurysm Father 49       brain   Alzheimer's disease Father    Thyroid disease Daughter    Stroke Daughter        2020   Atrial fibrillation Son        2020   Allergies as of 02/21/2023       Reactions   Doxycycline    GI upset   Keflex [cephalexin] Hives, Itching   Iron Rash        Medication List        Accurate as of February 21, 2023 10:03 AM. If you have any questions, ask your nurse or doctor.          aspirin EC 81 MG tablet Take 81 mg by mouth daily.   B-12 Compliance Injection 1000 MCG/ML Kit Generic drug: Cyanocobalamin Inject 1,000 mcg as directed every 30 (thirty) days. 08/28/2019 Weekly   ciprofloxacin 500 MG tablet Commonly known as: CIPRO Take 1 tablet (500 mg total) by mouth 2 (two) times daily.   estradiol 0.1 MG/GM vaginal cream Commonly  known as: ESTRACE Apply to vaginal area using a pea size amount on finger tip 3x weekly   FLAX SEED OIL PO Take 1,300 mg by mouth 2 (two) times daily.   levothyroxine 88 MCG tablet Commonly known as: SYNTHROID Take 1 tablet (88 mcg total) by mouth daily before breakfast.   lisinopril 20 MG tablet Commonly known as: ZESTRIL Take 1 tablet (20 mg total) by mouth daily.   mirabegron ER 25 MG Tb24 tablet Commonly known as: MYRBETRIQ Take 1 tablet (25 mg total) by mouth daily.   MULTIPLE VITAMINS/WOMENS PO Take 1 tablet by mouth daily. Unknown strength   PRESERVISION AREDS PO Take by mouth.   sulfamethoxazole-trimethoprim 400-80 MG tablet Commonly known as: BACTRIM Take 1 tablet by mouth at bedtime.   SYSTANE OP Apply 1 drop to eye as needed (Eye dryness).   Vitamin D3 25 MCG (1000 UT) Caps Take 1 capsule by mouth daily. 2,000 units daily        All past medical history, surgical history, allergies, family history, immunizations andmedications were updated in the EMR today and reviewed under the history and medication portions of their EMR.     ROS Negative, with the exception of above mentioned in HPI   Objective:  BP 122/74   Pulse 85   Temp (!) 97.5 F (36.4 C)   Wt 138 lb 6.4 oz (62.8 kg)   SpO2 98%   BMI 23.94 kg/m  Body mass index is 23.94 kg/m. Physical Exam Vitals and nursing note reviewed.  Constitutional:      General: She is not in acute distress.    Appearance: Normal appearance. She is normal weight. She is not ill-appearing or toxic-appearing.  HENT:     Head: Normocephalic and atraumatic.  Eyes:     General: No scleral icterus.       Right eye: No discharge.        Left eye: No discharge.     Extraocular Movements: Extraocular movements intact.     Conjunctiva/sclera: Conjunctivae normal.     Pupils: Pupils are equal, round, and reactive to light.  Skin:    General: Skin is warm.     Findings: No bruising, erythema, lesion or rash.      Comments: Right large toe: No erythema, no swelling, no drainage.  Thickened nail  present  Neurological:     Mental Status: She is alert and oriented to person, place, and time. Mental status is at baseline.     Motor: No weakness.     Coordination: Coordination normal.     Gait: Gait normal.  Psychiatric:        Mood and Affect: Mood normal.        Behavior: Behavior normal.        Thought Content: Thought content normal.        Judgment: Judgment normal.     No results found. No results found. No results found for this or any previous visit (from the past 24 hours).  Assessment/Plan: Denise Fuller is a 88 y.o. female present for OV for  Ingrown nail of great toe (Primary) -No signs of swelling or infection currently.  This seems to be a recurrent for her and would benefit from podiatry referral to consider partial nail removal. - epson salt soaks in warm water as needed - Ambulatory referral to Podiatry  Reviewed expectations re: course of current medical issues. Discussed self-management of symptoms. Outlined signs and symptoms indicating need for more acute intervention. Patient verbalized understanding and all questions were answered. Patient received an After-Visit Summary.    Orders Placed This Encounter  Procedures   Ambulatory referral to Podiatry   No orders of the defined types were placed in this encounter.  Referral Orders         Ambulatory referral to Podiatry       Note is dictated utilizing voice recognition software. Although note has been proof read prior to signing, occasional typographical errors still can be missed. If any questions arise, please do not hesitate to call for verification.   electronically signed by:  Felix Pacini, DO  [facility] [place]

## 2023-02-21 ENCOUNTER — Ambulatory Visit: Payer: PPO | Admitting: Family Medicine

## 2023-02-21 ENCOUNTER — Encounter: Payer: Self-pay | Admitting: Family Medicine

## 2023-02-21 ENCOUNTER — Ambulatory Visit: Payer: PPO

## 2023-02-21 VITALS — BP 122/74 | HR 85 | Temp 97.5°F | Wt 138.4 lb

## 2023-02-21 DIAGNOSIS — L600 Ingrowing nail: Secondary | ICD-10-CM | POA: Diagnosis not present

## 2023-02-21 DIAGNOSIS — E538 Deficiency of other specified B group vitamins: Secondary | ICD-10-CM

## 2023-02-21 NOTE — Patient Instructions (Signed)
 Ingrown Toenail  An ingrown toenail occurs when the corner or sides of a toenail grow into the surrounding skin. This causes discomfort and pain. The big toe is most commonly affected, but any of the toes can be affected. If an ingrown toenail is not treated, it can become infected. What are the causes? This condition may be caused by: Wearing shoes that are too small or tight. An injury, such as stubbing your toe or having your toe stepped on. Improper cutting or care of your toenails. Having nail or foot abnormalities that were present from birth (congenital abnormalities), such as having a nail that is too big for your toe. What increases the risk? The following factors may make you more likely to develop ingrown toenails: Age. Nails tend to get thicker with age, so ingrown nails are more common among older people. Cutting your toenails incorrectly, such as cutting them very short or cutting them unevenly. An ingrown toenail is more likely to get infected if you have: Diabetes. Blood flow (circulation) problems. What are the signs or symptoms? Symptoms of an ingrown toenail may include: Pain, soreness, or tenderness. Redness. Swelling. Hardening of the skin that surrounds the toenail. Signs that an ingrown toenail may be infected include: Fluid or pus. Symptoms that get worse. How is this diagnosed? Ingrown toenails may be diagnosed based on: Your symptoms and medical history. A physical exam. Labs or tests. If you have fluid or blood coming from your toenail, a sample may be collected to test for the specific type of bacteria that is causing the infection. How is this treated? Treatment depends on the severity of your symptoms. You may be able to care for your toenail at home. If you have an infection, you may be prescribed antibiotic medicines. If you have fluid or pus draining from your toenail, your health care provider may drain it. If you have trouble walking, you may be  given crutches to use. If you have a severe or infected ingrown toenail, you may need a procedure to remove part or all of the nail. Follow these instructions at home: Foot care  Check your wound every day for signs of infection, or as often as told by your health care provider. Check for: More redness, swelling, or pain. More fluid or blood. Warmth. Pus or a bad smell. Do not pick at your toenail or try to remove it yourself. Soak your foot in warm, soapy water. Do this for 20 minutes, 3 times a day, or as often as told by your health care provider. This helps to keep your toe clean and your skin soft. Wear shoes that fit well and are not too tight. Your health care provider may recommend that you wear open-toed shoes while you heal. Trim your toenails regularly and carefully. Cut your toenails straight across to prevent injury to the skin at the corners of the toenail. Do not cut your nails in a curved shape. Keep your feet clean and dry to help prevent infection. General instructions Take over-the-counter and prescription medicines only as told by your health care provider. If you were prescribed an antibiotic, take it as told by your health care provider. Do not stop taking the antibiotic even if you start to feel better. If your health care provider told you to use crutches to help you move around, use them as instructed. Return to your normal activities as told by your health care provider. Ask your health care provider what activities are safe for you.  Keep all follow-up visits. This is important. Contact a health care provider if: You have more redness, swelling, pain, or other symptoms that do not improve with treatment. You have fluid, blood, or pus coming from your toenail. You have a red streak on your skin that starts at your foot and spreads up your leg. You have a fever. Summary An ingrown toenail occurs when the corner or sides of a toenail grow into the surrounding skin.  This causes discomfort and pain. The big toe is most commonly affected, but any of the toes can be affected. If an ingrown toenail is not treated, it can become infected. Fluid or pus draining from your toenail is a sign of infection. Your health care provider may need to drain it. You may be given antibiotics to treat the infection. Trimming your toenails regularly and properly can help you prevent an ingrown toenail. This information is not intended to replace advice given to you by your health care provider. Make sure you discuss any questions you have with your health care provider. Document Revised: 05/24/2020 Document Reviewed: 05/24/2020 Elsevier Patient Education  2024 [employer].

## 2023-03-01 ENCOUNTER — Ambulatory Visit (INDEPENDENT_AMBULATORY_CARE_PROVIDER_SITE_OTHER): Payer: PPO | Admitting: Podiatry

## 2023-03-01 DIAGNOSIS — S90111A Contusion of right great toe without damage to nail, initial encounter: Secondary | ICD-10-CM | POA: Diagnosis not present

## 2023-03-01 NOTE — Progress Notes (Signed)
  Subjective:  Patient ID: Denise Fuller, female    DOB: May 12, 1935,   MRN: [id number]  Chief Complaint  Patient presents with   Ingrown Toenail    Pt presents for ingrown toenail great toe on the right.    88 y.o. female presents for concern of right great toe pain that has been ongoing for several months. Concerned she may have an ingrown. Relates it hurts to wear certain shoes. She is weary of going to the salon but does go . Denies any other pedal complaints. Denies n/v/f/c.   Past Medical History:  Diagnosis Date   Abnormal SPEP 07/16/2019   B12 deficiency 07/16/2019   Bartonella infection    BCC (basal cell carcinoma of skin) 1997   nose   Chickenpox    Chronic bilateral low back pain without sciatica 06/23/2019   Chronic venous insufficiency    Diverticulosis    Dyspnea on exertion 07/10/2019   Elevated alkaline phosphatase level    Elevated LDH    Elevated serum immunoglobulin free light chains 07/21/2019   Environmental allergies    GAD (generalized anxiety disorder)    Goals of care, counseling/discussion 08/28/2019   Groin abscess    culture negative, doxy resolved abscess   Hepatitis A    Hyperlipidemia    Hypertension    Hypothyroidism    Ingrown nail 04/10/2018   Lightheadedness 12/03/2018   Lymphadenopathy, cervical    chronic per pt. (left)   Macular degeneration    Meniere disease    MGUS (monoclonal gammopathy of unknown significance) 08/28/2019   Other fatigue 12/03/2018   Overweight (BMI 25.0-29.9) 04/10/2018   Palpitations 12/03/2018   Thyroid disease    Weakness generalized 12/03/2018    Objective:  Physical Exam: Vascular: DP/PT pulses 2/4 bilateral. CFT <3 seconds. Normal hair growth on digits. No edema.  Skin. No lacerations or abrasions bilateral feet. No incurvation of the medial border of the right great toe but tender in area. Distal hallux with lose of fat pad and tenderness.  Musculoskeletal: MMT 5/5 bilateral lower extremities in DF, PF,  Inversion and Eversion. Deceased ROM in DF of ankle joint.  Neurological: Sensation intact to light touch.   Assessment:   1. Contusion of right great toe without damage to nail, initial encounter      Plan:  Patient was evaluated and treated and all questions answered. Discussed ingrown toenails etiology and treatment options including procedure for removal vs conservative care.  Discussed her ingrown does not appear to be most of the problem and is getting a lot of pressure on the tip of the shoe possibly from shoes improper fitting and lose of fat pad in the area.  Toe cap dispensed to offload area.  Return as needed if continue to have pain.     Louann Sjogren, DPM

## 2023-04-04 ENCOUNTER — Ambulatory Visit (INDEPENDENT_AMBULATORY_CARE_PROVIDER_SITE_OTHER): Payer: PPO

## 2023-04-04 DIAGNOSIS — E538 Deficiency of other specified B group vitamins: Secondary | ICD-10-CM

## 2023-04-04 MED ORDER — CYANOCOBALAMIN 1000 MCG/ML IJ SOLN
1000.0000 ug | Freq: Once | INTRAMUSCULAR | Status: AC
  Administered 2023-04-04: 1000 ug via INTRAMUSCULAR

## 2023-04-04 NOTE — Progress Notes (Signed)
 Pt here for monthly B12 injection per Dr Claiborne Billings  B12 given IM, and pt tolerated injection well.  Next B12 injection scheduled for

## 2023-04-17 ENCOUNTER — Ambulatory Visit: Payer: PPO | Admitting: Urology

## 2023-05-02 ENCOUNTER — Ambulatory Visit: Payer: PPO

## 2023-05-13 ENCOUNTER — Ambulatory Visit (INDEPENDENT_AMBULATORY_CARE_PROVIDER_SITE_OTHER)

## 2023-05-13 DIAGNOSIS — E538 Deficiency of other specified B group vitamins: Secondary | ICD-10-CM | POA: Diagnosis not present

## 2023-05-13 MED ORDER — CYANOCOBALAMIN 1000 MCG/ML IJ SOLN
1000.0000 ug | Freq: Once | INTRAMUSCULAR | Status: AC
  Administered 2023-05-13: 1000 ug via INTRAMUSCULAR

## 2023-05-13 NOTE — Progress Notes (Signed)
Pt here for monthly B12 injection per Dr.[name]   B12 1000mcg given IM, and pt tolerated injection well.   Next B12 injection scheduled for 1 month.       

## 2023-05-22 ENCOUNTER — Ambulatory Visit (HOSPITAL_BASED_OUTPATIENT_CLINIC_OR_DEPARTMENT_OTHER)
Admission: RE | Admit: 2023-05-22 | Discharge: 2023-05-22 | Disposition: A | Discharge status: Home / Self Care | Source: Ambulatory Visit | Attending: Family Medicine | Admitting: Family Medicine

## 2023-05-22 ENCOUNTER — Encounter: Payer: Self-pay | Admitting: Family Medicine

## 2023-05-22 ENCOUNTER — Ambulatory Visit (INDEPENDENT_AMBULATORY_CARE_PROVIDER_SITE_OTHER): Admitting: Family Medicine

## 2023-05-22 VITALS — BP 130/84 | HR 75 | Temp 98.1°F | Wt 138.0 lb

## 2023-05-22 DIAGNOSIS — M545 Low back pain, unspecified: Secondary | ICD-10-CM

## 2023-05-22 DIAGNOSIS — I6782 Cerebral ischemia: Secondary | ICD-10-CM | POA: Diagnosis not present

## 2023-05-22 DIAGNOSIS — R413 Other amnesia: Secondary | ICD-10-CM

## 2023-05-22 DIAGNOSIS — A498 Other bacterial infections of unspecified site: Secondary | ICD-10-CM | POA: Diagnosis not present

## 2023-05-22 DIAGNOSIS — N3281 Overactive bladder: Secondary | ICD-10-CM

## 2023-05-22 DIAGNOSIS — M48061 Spinal stenosis, lumbar region without neurogenic claudication: Secondary | ICD-10-CM | POA: Diagnosis not present

## 2023-05-22 DIAGNOSIS — R3129 Other microscopic hematuria: Secondary | ICD-10-CM | POA: Diagnosis not present

## 2023-05-22 LAB — B12 AND FOLATE PANEL
Folate: 11.4 ng/mL (ref >5.9)
Vitamin B-12: 824 pg/mL (ref 211–911)

## 2023-05-22 LAB — VITAMIN D 25 HYDROXY (VIT D DEFICIENCY, FRACTURES): VITD: 31.78 ng/mL (ref 30.00–100.00)

## 2023-05-22 LAB — TSH: TSH: 2.82 u[IU]/mL (ref 0.35–5.50)

## 2023-05-22 MED ORDER — TIZANIDINE HCL 4 MG PO TABS: 2.0000 mg | ORAL_TABLET | Freq: Every day | ORAL | 0 refills | Status: DC

## 2023-05-22 MED ORDER — DICLOFENAC SODIUM 75 MG PO TBEC: 75.0000 mg | DELAYED_RELEASE_TABLET | Freq: Every day | ORAL | 0 refills | Status: DC

## 2023-05-22 MED ORDER — GADOBUTROL 1 MMOL/ML IV SOLN
7.0000 mL | Freq: Once | INTRAVENOUS | Status: AC | PRN
Start: 2023-05-22 — End: 2023-05-22
  Administered 2023-05-22: 7 mL via INTRAVENOUS

## 2023-05-22 MED ORDER — SULFAMETHOXAZOLE-TRIMETHOPRIM 800-160 MG PO TABS: 1.0000 | ORAL_TABLET | Freq: Two times a day (BID) | ORAL | 0 refills | Status: AC

## 2023-05-22 NOTE — Patient Instructions (Addendum)
 Return in about 3 weeks (around 06/12/2023) for Routine chronic condition follow-up.  Antibiotic prescribed in case the urine looks infectious, so you have it on your trip.  Please get back xray at [facility] on [address].  - start diclofenac once a day with food for 2 weeks to help - I called in a muscle relaxer - take before bed - only a half tab.   I will order an mri of your brain to further evaluate your memory changes.       Great to see you today.  I have refilled the medication(s) we provide.   If labs were collected or images ordered, we will inform you of  results once we have received them and reviewed. We will contact you either by echart message, or telephone call.  Please give ample time to the testing facility, and our office to run,  receive and review results. Please do not call inquiring of results, even if you can see them in your chart. We will contact you as soon as we are able. If it has been over 1 week since the test was completed, and you have not yet heard from us , then please call us .    - echart message- for normal results that have been seen by the patient already.   - telephone call: abnormal results or if patient has not viewed results in their echart.  If a referral to a specialist was entered for you, please call us  in 2 weeks if you have not heard from the specialist office to schedule. '

## 2023-05-22 NOTE — Progress Notes (Signed)
 Denise Fuller , 1935/09/09, 88 y.o., female MRN: [id number] Patient Care Team    Relationship Specialty Notifications Start End  Natalia Leatherwood, DO PCP - General Family Medicine  01/10/16   Jill Side, OD Referring Physician   01/10/16    Comment: ophth- macular degeneration  Darnelle Maffucci  Dentistry  04/26/17   Joline Maxcy, MD Referring Physician Urology  12/20/22     Chief Complaint  Patient presents with   Back Pain    6 weeks; lower back pain. Pt states she feels pain more when sitting down and standing up. Pt has taken tylenol.      Subjective: Denise Fuller is a 88 y.o. Pt presents for an OV with complaints of back pain with neuropathy of 6 weeks duration Prior medical history of stage IIIa chronic kidney disease, MGUS and B12 deficiency Pt has tried tylenol to ease their symptoms  Overactive bladder (Primary)/hematuria/persistent protease infection Patient denies any dysuria.  She has chronic urinary frequency.  She has recently established with Dr. Gaynell Face for urology, however he retired and she is in need of finding a new urologist.  She would like a female urologist possible. Dr. Gaynell Face had prescribed her estradiol vaginal insertion, had her on 3 months course of Bactrim and encouraged her to start cranberry supplement and a probiotic which she has.  She is wondering if the lumbar back pain she has the last 6 weeks could be from a UTI.  She denies any fevers or chills.   Lumbar back pain Patient points to her lower lumbar area as location of discomfort on both sides of her spine.  Pain has been present for approximately 6 weeks . she states the discomfort is worse if she sits for a while or when standing long periods of time.  She endorses having a tingling sensation on the right lateral aspect of her calf.  She denies any recent injury or overactivity.  She has a history of compression fracture T12 and mild to moderate degenerative changes of the lumbar  spine with degenerative levoscoliosis mid lumbar spine according to x-ray 06/2019.  She has had no spinal surgery in the past Patient has a significant history of MGUS, due for her every 28-month follow-up in a couple 4 weeks for labs.  Memory change Patient reports she has noticed her memory declining over a few years, but her daughter noticed recently that she has had a rapid decline in her memory and brought it to her attention.  She is taking vitamin D, B12 and just start Prevagen supplement. .   Renal US 01/2023 FINDINGS: Right Kidney: Renal measurements: 9.5 x 3.9 x 4.3 cm = volume: 82.2 mL. Echogenicity within normal limits. No mass or hydronephrosis visualized. Left Kidney: Renal measurements: 10.2 x 4.8 x 4.1 cm = volume: 100.8 mL. Echogenicity within normal limits. No mass or hydronephrosis visualized. Bladder: Appears normal for degree of bladder distention. IMPRESSION: No hydronephrosis.     06/2019- lumbar xray FINDINGS: There is a degenerative levoscoliosis centered in the mid lumbar spine. Mild-to-moderate degenerative changes are noted throughout the lumbar spine, greatest in the lower lumbar segments were there is moderate to severe disc height loss and mild to moderate facet arthrosis. There is an age-indeterminate compression fracture of the T12 vertebral body with approximately 40% height loss anteriorly.   IMPRESSION: 1. Age-indeterminate compression fracture of the T12 vertebral body. 2. Mild to moderate multilevel degenerative changes of the lumbar spine.  3. Degenerative levoscoliosis centered in the mid lumbar spine.     04/02/2022    1:13 PM 03/29/2021    9:44 AM 02/10/2021    4:07 PM 03/23/2020    9:03 AM 10/15/2018    9:42 AM  Depression screen PHQ 2/9  Decreased Interest 0 0 0 0 0  Down, Depressed, Hopeless 0 0 0 0 0  PHQ - 2 Score 0 0 0 0 0    Allergies  Allergen Reactions   Doxycycline     GI upset   Keflex [Cephalexin] Hives and Itching    Iron Rash   Social History   Social History Narrative   Retired. Married to [place]. They have 3 children [place])   Moved from [place] 05/2014.    Has 2 older dogs.    Retired.    Drinks caffeine, takes herbal remedies and dialy vitamin.   Wears her seatbelt, smoke detector in the home   Wears dentures, no assistive devices for walking - independent.    Feels safe in her relationships.    Past Medical History:  Diagnosis Date   Abnormal SPEP 07/16/2019   B12 deficiency 07/16/2019   Bartonella infection    BCC (basal cell carcinoma of skin) 1997   nose   Chickenpox    Chronic bilateral low back pain without sciatica 06/23/2019   Chronic venous insufficiency    Diverticulosis    Dyspnea on exertion 07/10/2019   Elevated alkaline phosphatase level    Elevated LDH    Elevated serum immunoglobulin free light chains 07/21/2019   Environmental allergies    GAD (generalized anxiety disorder)    Goals of care, counseling/discussion 08/28/2019   Groin abscess    culture negative, doxy resolved abscess   Hepatitis A    Hyperlipidemia    Hypertension    Hypothyroidism    Ingrown nail 04/10/2018   Lightheadedness 12/03/2018   Lymphadenopathy, cervical    chronic per pt. (left)   Macular degeneration    Meniere disease    MGUS (monoclonal gammopathy of unknown significance) 08/28/2019   Other fatigue 12/03/2018   Overweight (BMI 25.0-29.9) 04/10/2018   Palpitations 12/03/2018   Thyroid disease    Weakness generalized 12/03/2018   Past Surgical History:  Procedure Laterality Date   ABDOMINAL HYSTERECTOMY  1991   APPENDECTOMY  1967   BASAL CELL CARCINOMA EXCISION  1997   nose   BLEPHAROPLASTY     CATARACT EXTRACTION W/ INTRAOCULAR LENS IMPLANT Bilateral Oct and Nov 2018   CHOLECYSTECTOMY  1967   ORIF WRIST FRACTURE Left 05/18/2020   Family History  Problem Relation Age of Onset   Arthritis Mother    Heart disease Mother    Arthritis Father    Aneurysm Father 54        brain   Alzheimer's disease Father    Thyroid disease Daughter    Stroke Daughter        2020   Atrial fibrillation Son        2020   Allergies as of 05/22/2023       Reactions   Doxycycline    GI upset   Keflex [cephalexin] Hives, Itching   Iron Rash        Medication List        Accurate as of May 22, 2023 12:34 PM. If you have any questions, ask your nurse or doctor.          STOP taking these medications  ciprofloxacin 500 MG tablet Commonly known as: CIPRO Stopped by: Felix Pacini       TAKE these medications    aspirin EC 81 MG tablet Take 81 mg by mouth daily.   B-12 Compliance Injection 1000 MCG/ML Kit Generic drug: Cyanocobalamin Inject 1,000 mcg as directed every 30 (thirty) days. 08/28/2019 Weekly   diclofenac 75 MG EC tablet Commonly known as: VOLTAREN Take 1 tablet (75 mg total) by mouth daily. With food Started by: Felix Pacini   estradiol 0.1 MG/GM vaginal cream Commonly known as: ESTRACE Apply to vaginal area using a pea size amount on finger tip 3x weekly   FLAX SEED OIL PO Take 1,300 mg by mouth 2 (two) times daily.   levothyroxine 88 MCG tablet Commonly known as: SYNTHROID Take 1 tablet (88 mcg total) by mouth daily before breakfast.   lisinopril 20 MG tablet Commonly known as: ZESTRIL Take 1 tablet (20 mg total) by mouth daily.   mirabegron ER 25 MG Tb24 tablet Commonly known as: MYRBETRIQ Take 1 tablet (25 mg total) by mouth daily.   MULTIPLE VITAMINS/WOMENS PO Take 1 tablet by mouth daily. Unknown strength   PRESERVISION AREDS PO Take by mouth.   sulfamethoxazole-trimethoprim 400-80 MG tablet Commonly known as: BACTRIM Take 1 tablet by mouth at bedtime. What changed: Another medication with the same name was added. Make sure you understand how and when to take each. Changed by: Felix Pacini   sulfamethoxazole-trimethoprim 800-160 MG tablet Commonly known as: BACTRIM DS Take 1 tablet by mouth 2 (two) times  daily for 7 days. What changed: You were already taking a medication with the same name, and this prescription was added. Make sure you understand how and when to take each. Changed by: Tamala Bari OP Apply 1 drop to eye as needed (Eye dryness).   tiZANidine 4 MG tablet Commonly known as: Zanaflex Take 0.5 tablets (2 mg total) by mouth at bedtime. Started by: Felix Pacini   Vitamin D3 25 MCG (1000 UT) Caps Take 1 capsule by mouth daily. 2,000 units daily        All past medical history, surgical history, allergies, family history, immunizations andmedications were updated in the EMR today and reviewed under the history and medication portions of their EMR.     ROS Negative, with the exception of above mentioned in HPI   Objective:  BP 130/84   Pulse 75   Temp 98.1 F (36.7 C)   Wt 138 lb (62.6 kg)   SpO2 98%   BMI 23.87 kg/m  Body mass index is 23.87 kg/m. Physical Exam Vitals and nursing note reviewed.  Constitutional:      General: She is not in acute distress.    Appearance: Normal appearance. She is not ill-appearing, toxic-appearing or diaphoretic.  HENT:     Head: Normocephalic and atraumatic.  Eyes:     General: No scleral icterus.       Right eye: No discharge.        Left eye: No discharge.     Extraocular Movements: Extraocular movements intact.     Conjunctiva/sclera: Conjunctivae normal.     Pupils: Pupils are equal, round, and reactive to light.  Cardiovascular:     Rate and Rhythm: Normal rate and regular rhythm.  Pulmonary:     Effort: Pulmonary effort is normal. No respiratory distress.     Breath sounds: Normal breath sounds. No wheezing, rhonchi or rales.  Musculoskeletal:     Lumbar back: Spasms  present. No tenderness or bony tenderness. Decreased range of motion. Scoliosis present.       Back:     Right lower leg: No edema.     Left lower leg: No edema.     Comments: Lumbar spine: Patient reports the area to be her lower back  as the area of discomfort, bilaterally.  No bony tenderness today, muscle spasms present left lumbar paraspinal with mild curvature of spine present left.  Discomfort with lumbar extension and sidebending.  Neurovascularly intact distally  Skin:    General: Skin is warm.     Findings: No rash.  Neurological:     Mental Status: She is alert and oriented to person, place, and time. Mental status is at baseline.     Motor: No weakness.     Gait: Gait normal.  Psychiatric:        Attention and Perception: Attention normal.        Mood and Affect: Mood normal.        Speech: Speech normal.        Behavior: Behavior normal. Behavior is cooperative.        Thought Content: Thought content normal. Thought content is not paranoid or delusional. Thought content does not include homicidal or suicidal ideation.        Cognition and Memory: Cognition is impaired. She exhibits impaired recent memory.        Judgment: Judgment normal.      No results found. No results found. No results found for this or any previous visit (from the past 24 hours).  Assessment/Plan: Lisaanne D Cauthon is a 88 y.o. female present for OV for  Overactive bladder (Primary)/hematuria/persistent protease infection Patient had recently established with Dr. Charon Copper, which has now retired.  He would like a female urologist if possible. He had prescribed her 11-month course of Bactrim, estradiol and 100 taking cranberry supplement as well as a probiotic. She also feels like there is a possibility of her back pain being a UTI, but complains of no other urinary symptoms today. She is going out of town tomorrow, therefore called in Bactrim twice daily for 7 days and collected urinalysis with reflex culture today. - Ambulatory referral to Urology-referred to Dr. Valeta Gaudier   Lumbar back pain With her exam and HPI, the etiology of her pain sounds more consistent with lumbar back pain with radiculopathy to her right leg.  We discussed this  in detail today and elected to do an x-ray of her lumbar spine at [facility].  We will call her with these results. Start diclofenac 75 mg daily with food for 2-4 weeks Start Zanaflex 2 mg nightly She is due for chronic condition appointment in a couple weeks, encouraged her to make visit and approximately 3 weeks and we will touch base on her lumbar back pain again.  If needed we will obtain advanced imaging at that time and she will be due for her SPEP - DG Lumbar Spine Complete; Future - Urinalysis w microscopic + reflex cultur  Memory change There has been noted decline in patient's cognitive status over the last 2 years.  Her daughter has recently noticed more cognitive decline.  Patient feels she noticed it after she had tick bites recently. Recheck TSH, vitamin D B12 folate and added Lyme antibodies.  She does receive B12 injections routinely. MRI brain ordered - TSH - Vitamin D (25 hydroxy) - B12 and Folate Panel - B. burgdorfi antibodies .  Reviewed expectations  re: course of current medical issues. Discussed self-management of symptoms. Outlined signs and symptoms indicating need for more acute intervention. Patient verbalized understanding and all questions were answered. Patient received an After-Visit Summary.  Return in about 3 weeks (around 06/12/2023) for Routine chronic condition follow-up.   Orders Placed This Encounter  Procedures   DG Lumbar Spine Complete   MR BRAIN W WO CONTRAST   TSH   Vitamin D (25 hydroxy)   B12 and Folate Panel   B. burgdorfi antibodies   Urinalysis w microscopic + reflex cultur   Ambulatory referral to Urology   Meds ordered this encounter  Medications   tiZANidine (ZANAFLEX) 4 MG tablet    Sig: Take 0.5 tablets (2 mg total) by mouth at bedtime.    Dispense:  30 tablet    Refill:  0   diclofenac (VOLTAREN) 75 MG EC tablet    Sig: Take 1 tablet (75 mg total) by mouth daily. With food    Dispense:  30 tablet    Refill:  0    sulfamethoxazole-trimethoprim (BACTRIM DS) 800-160 MG tablet    Sig: Take 1 tablet by mouth 2 (two) times daily for 7 days.    Dispense:  14 tablet    Refill:  0   Referral Orders         Ambulatory referral to Urology       Note is dictated utilizing voice recognition software. Although note has been proof read prior to signing, occasional typographical errors still can be missed. If any questions arise, please do not hesitate to call for verification.   electronically signed by:  Napolean Backbone, DO  [facility] [place]

## 2023-05-23 ENCOUNTER — Encounter: Payer: Self-pay | Admitting: Family Medicine

## 2023-05-23 LAB — URINALYSIS W MICROSCOPIC + REFLEX CULTURE
Bilirubin Urine: NEGATIVE
Glucose, UA: NEGATIVE
Hyaline Cast: NONE SEEN /LPF
Leukocyte Esterase: NEGATIVE
Nitrites, Initial: NEGATIVE
Specific Gravity, Urine: 1.029 (ref 1.001–1.035)
Squamous Epithelial / HPF: NONE SEEN /HPF (ref <=5)
pH: 5.5 (ref 5.0–8.0)

## 2023-05-23 LAB — NO CULTURE INDICATED

## 2023-05-23 LAB — B. BURGDORFI ANTIBODIES: B burgdorferi Ab IgG+IgM: <0.9 {index}

## 2023-06-03 DIAGNOSIS — H353132 Nonexudative age-related macular degeneration, bilateral, intermediate dry stage: Secondary | ICD-10-CM | POA: Diagnosis not present

## 2023-06-10 ENCOUNTER — Encounter: Payer: Self-pay | Admitting: Family Medicine

## 2023-06-12 ENCOUNTER — Encounter: Payer: Self-pay | Admitting: Family Medicine

## 2023-06-12 ENCOUNTER — Ambulatory Visit (INDEPENDENT_AMBULATORY_CARE_PROVIDER_SITE_OTHER): Admitting: Family Medicine

## 2023-06-12 VITALS — BP 130/82 | HR 85 | Temp 97.6°F | Wt 134.4 lb

## 2023-06-12 DIAGNOSIS — M545 Low back pain, unspecified: Secondary | ICD-10-CM

## 2023-06-12 DIAGNOSIS — E039 Hypothyroidism, unspecified: Secondary | ICD-10-CM | POA: Diagnosis not present

## 2023-06-12 DIAGNOSIS — N3281 Overactive bladder: Secondary | ICD-10-CM

## 2023-06-12 DIAGNOSIS — I10 Essential (primary) hypertension: Secondary | ICD-10-CM

## 2023-06-12 DIAGNOSIS — D472 Monoclonal gammopathy: Secondary | ICD-10-CM | POA: Diagnosis not present

## 2023-06-12 DIAGNOSIS — M51362 Other intervertebral disc degeneration, lumbar region with discogenic back pain and lower extremity pain: Secondary | ICD-10-CM

## 2023-06-12 DIAGNOSIS — R748 Abnormal levels of other serum enzymes: Secondary | ICD-10-CM | POA: Diagnosis not present

## 2023-06-12 DIAGNOSIS — S22000A Wedge compression fracture of unspecified thoracic vertebra, initial encounter for closed fracture: Secondary | ICD-10-CM

## 2023-06-12 DIAGNOSIS — N1831 Chronic kidney disease, stage 3a: Secondary | ICD-10-CM | POA: Diagnosis not present

## 2023-06-12 DIAGNOSIS — R768 Other specified abnormal immunological findings in serum: Secondary | ICD-10-CM

## 2023-06-12 DIAGNOSIS — E782 Mixed hyperlipidemia: Secondary | ICD-10-CM | POA: Diagnosis not present

## 2023-06-12 DIAGNOSIS — H353290 Exudative age-related macular degeneration, unspecified eye, stage unspecified: Secondary | ICD-10-CM

## 2023-06-12 DIAGNOSIS — E538 Deficiency of other specified B group vitamins: Secondary | ICD-10-CM | POA: Diagnosis not present

## 2023-06-12 DIAGNOSIS — M4186 Other forms of scoliosis, lumbar region: Secondary | ICD-10-CM

## 2023-06-12 DIAGNOSIS — A498 Other bacterial infections of unspecified site: Secondary | ICD-10-CM

## 2023-06-12 DIAGNOSIS — H8109 Meniere's disease, unspecified ear: Secondary | ICD-10-CM

## 2023-06-12 DIAGNOSIS — I872 Venous insufficiency (chronic) (peripheral): Secondary | ICD-10-CM

## 2023-06-12 LAB — COMPREHENSIVE METABOLIC PANEL WITH GFR
ALT: 14 U/L (ref 0–35)
AST: 19 U/L (ref 0–37)
Albumin: 4.1 g/dL (ref 3.5–5.2)
Alkaline Phosphatase: 146 U/L — ABNORMAL HIGH (ref 39–117)
BUN: 12 mg/dL (ref 6–23)
CO2: 30 meq/L (ref 19–32)
Calcium: 9 mg/dL (ref 8.4–10.5)
Chloride: 103 meq/L (ref 96–112)
Creatinine, Ser: 0.75 mg/dL (ref 0.40–1.20)
GFR: 72.55 mL/min (ref >60.00)
Glucose, Bld: 77 mg/dL (ref 70–99)
Potassium: 3.9 meq/L (ref 3.5–5.1)
Sodium: 139 meq/L (ref 135–145)
Total Bilirubin: 0.5 mg/dL (ref 0.2–1.2)
Total Protein: 6.3 g/dL (ref 6.0–8.3)

## 2023-06-12 LAB — CBC WITH DIFFERENTIAL/PLATELET
Basophils Absolute: 0.1 10*3/uL (ref 0.0–0.1)
Basophils Relative: 1 % (ref 0.0–3.0)
Eosinophils Absolute: 0.4 10*3/uL (ref 0.0–0.7)
Eosinophils Relative: 5.4 % — ABNORMAL HIGH (ref 0.0–5.0)
HCT: 40.2 % (ref 36.0–46.0)
Hemoglobin: 13.2 g/dL (ref 12.0–15.0)
Lymphocytes Relative: 34.4 % (ref 12.0–46.0)
Lymphs Abs: 2.2 10*3/uL (ref 0.7–4.0)
MCHC: 32.8 g/dL (ref 30.0–36.0)
MCV: 92.5 fl (ref 78.0–100.0)
Monocytes Absolute: 0.8 10*3/uL (ref 0.1–1.0)
Monocytes Relative: 12.6 % — ABNORMAL HIGH (ref 3.0–12.0)
Neutro Abs: 3 10*3/uL (ref 1.4–7.7)
Neutrophils Relative %: 46.6 % (ref 43.0–77.0)
Platelets: 274 10*3/uL (ref 150.0–400.0)
RBC: 4.35 Mil/uL (ref 3.87–5.11)
RDW: 14.4 % (ref 11.5–15.5)
WBC: 6.5 10*3/uL (ref 4.0–10.5)

## 2023-06-12 MED ORDER — MIRABEGRON ER 25 MG PO TB24: 25.0000 mg | ORAL_TABLET | Freq: Every day | ORAL | 5 refills | Status: DC

## 2023-06-12 MED ORDER — LISINOPRIL 20 MG PO TABS: 20.0000 mg | ORAL_TABLET | Freq: Every day | ORAL | 1 refills | Status: DC

## 2023-06-12 MED ORDER — TIZANIDINE HCL 4 MG PO TABS: 2.0000 mg | ORAL_TABLET | Freq: Every day | ORAL | 1 refills | Status: DC

## 2023-06-12 MED ORDER — DONEPEZIL HCL 5 MG PO TABS: 5.0000 mg | ORAL_TABLET | Freq: Every day | ORAL | 1 refills | Status: DC

## 2023-06-12 MED ORDER — DICLOFENAC SODIUM 75 MG PO TBEC: 75.0000 mg | DELAYED_RELEASE_TABLET | Freq: Every day | ORAL | 1 refills | Status: DC

## 2023-06-12 NOTE — Progress Notes (Unsigned)
 Denise Fuller , Jun 05, 1935, 88 y.o., female MRN: [id number] Patient Care Team    Relationship Specialty Notifications Start End  Mariel Shope, DO PCP - General Family Medicine  01/10/16   Ranny Bye, OD Referring Physician   01/10/16    Comment: ophth- macular degeneration  Laretta Pleasure  Dentistry  04/26/17   Scarlet Curly, MD Referring Physician Urology  12/20/22     Chief Complaint  Patient presents with   Hypertension    Back pain, Chronic Conditions/illness Management.  Pt is not fasting.       Subjective: Denise Fuller is a 88 y.o. female present for Chronic Conditions/illness Management All past medical history, surgical history, allergies, family history, immunizations and social history was obtained from the patient today and entered into the electronic medical record.  *** Essential hypertension/HLD Pt reports compliance with lisinopril  20  mg a day.  Patient denies chest pain, shortness of breath, dizziness or lower extremity edema.   Pt takesdaily baby ASA.   Hypothyroid: Labs due today.  Patient compliant with levothyroxine  88 mcg daily on an empty stomach.  Cognitive decline/b12 def: She has received B12 injections as indicated and has noticed an improvement.  Due for injection today. Prior note: Pt presents for an OV with her daughter today to discuss cognitive changes that have been noticed over the last year. Pt reports she has noticed she is more forgetful of appts and details of conversations. Her daughter reports the family noticed changes a little of a year ago. Started with forgetting having conversations. Daughter reports last couple of months there has been changes in her cooking, forgetting appts, even forgetting she wrote down appts, more details of conversations. When she is told she forgot she has started to become more "elevated." She has h/o b12 def and thyroid  disorder. She reports compliance with levothyroxine  and monthly b12 inj.  She has  had a h/o of abnormal spep, which has been stable. No h/o  stroke, but endorses today her eye doctor told her she was having "mini-strokes of her eye." She takes a baby ASA and BP is well controlled on medication.   Overactive bladder (Primary)/hematuria/persistent protease infection Patient denies any dysuria.  She has chronic urinary frequency.  She has recently established with Dr. Charon Copper for urology, however he retired and she is in need of finding a new urologist.  She would like a female urologist possible. Dr. Charon Copper had prescribed her estradiol  vaginal insertion, had her on 3 months course of Bactrim  and encouraged her to start cranberry supplement and a probiotic which she has.  She is wondering if the lumbar back pain she has the last 6 weeks could be from a UTI.  She denies any fevers or chills.   Lumbar back pain Patient points to her lower lumbar area as location of discomfort on both sides of her spine.  Pain has been present for approximately 6 weeks . she states the discomfort is worse if she sits for a while or when standing long periods of time.  She endorses having a tingling sensation on the right lateral aspect of her calf.  She denies any recent injury or overactivity.  She has a history of compression fracture T12 and mild to moderate degenerative changes of the lumbar spine with degenerative levoscoliosis mid lumbar spine according to x-ray 06/2019.  She has had no spinal surgery in the past Patient has a significant history of MGUS, due for her every 39-month  follow-up in a couple 4 weeks for labs.  Memory change Patient reports she has noticed her memory declining over a few years, but her daughter noticed recently that she has had a rapid decline in her memory and brought it to her attention.  She is taking vitamin D , B12 and just start Prevagen supplement. .   Renal US  01/2023 FINDINGS: Right Kidney: Renal measurements: 9.5 x 3.9 x 4.3 cm = volume: 82.2  mL. Echogenicity within normal limits. No mass or hydronephrosis visualized. Left Kidney: Renal measurements: 10.2 x 4.8 x 4.1 cm = volume: 100.8 mL. Echogenicity within normal limits. No mass or hydronephrosis visualized. Bladder: Appears normal for degree of bladder distention. IMPRESSION: No hydronephrosis.     06/2019- lumbar xray FINDINGS: There is a degenerative levoscoliosis centered in the mid lumbar spine. Mild-to-moderate degenerative changes are noted throughout the lumbar spine, greatest in the lower lumbar segments were there is moderate to severe disc height loss and mild to moderate facet arthrosis. There is an age-indeterminate compression fracture of the T12 vertebral body with approximately 40% height loss anteriorly.   IMPRESSION: 1. Age-indeterminate compression fracture of the T12 vertebral body. 2. Mild to moderate multilevel degenerative changes of the lumbar spine. 3. Degenerative levoscoliosis centered in the mid lumbar spine.     06/12/2023    9:09 AM 04/02/2022    1:13 PM 03/29/2021    9:44 AM 02/10/2021    4:07 PM 03/23/2020    9:03 AM  Depression screen PHQ 2/9  Decreased Interest 0 0 0 0 0  Down, Depressed, Hopeless 0 0 0 0 0  PHQ - 2 Score 0 0 0 0 0  Altered sleeping 0      Tired, decreased energy 0      Change in appetite 0      Feeling bad or failure about yourself  0      Trouble concentrating 0      Moving slowly or fidgety/restless 0      Suicidal thoughts 0      PHQ-9 Score 0      Difficult doing work/chores Not difficult at all        Allergies  Allergen Reactions   Doxycycline     GI upset   Keflex [Cephalexin] Hives and Itching   Iron Rash   Social History   Social History Narrative   Retired. Married to [place]. They have 3 children Alyn Judge)   Moved from [place]  05/2014.    Has 2 older dogs.    Retired.    Drinks caffeine, takes herbal remedies and dialy vitamin.   Wears her seatbelt, smoke detector in the  home   Wears dentures, no assistive devices for walking - independent.    Feels safe in her relationships.    Past Medical History:  Diagnosis Date   Abnormal SPEP 07/16/2019   B12 deficiency 07/16/2019   Bartonella infection    BCC (basal cell carcinoma of skin) 1997   nose   Chickenpox    Chronic bilateral low back pain without sciatica 06/23/2019   Chronic venous insufficiency    Diverticulosis    Dyspnea on exertion 07/10/2019   Elevated alkaline phosphatase level    Elevated LDH    Elevated serum immunoglobulin free light chains 07/21/2019   Environmental allergies    GAD (generalized anxiety disorder)    Goals of care, counseling/discussion 08/28/2019   Groin abscess    culture negative, doxy resolved abscess   Hepatitis A  Hyperlipidemia    Hypertension    Hypothyroidism    Ingrown nail 04/10/2018   Lightheadedness 12/03/2018   Lymphadenopathy, cervical    chronic per pt. (left)   Macular degeneration    Meniere disease    MGUS (monoclonal gammopathy of unknown significance) 08/28/2019   Other fatigue 12/03/2018   Overweight (BMI 25.0-29.9) 04/10/2018   Palpitations 12/03/2018   Thyroid  disease    Weakness generalized 12/03/2018   Past Surgical History:  Procedure Laterality Date   ABDOMINAL HYSTERECTOMY  1991   APPENDECTOMY  1967   BASAL CELL CARCINOMA EXCISION  1997   nose   BLEPHAROPLASTY     CATARACT EXTRACTION W/ INTRAOCULAR LENS IMPLANT Bilateral Oct and Nov 2018   CHOLECYSTECTOMY  1967   ORIF WRIST FRACTURE Left 05/18/2020   Family History  Problem Relation Age of Onset   Arthritis Mother    Heart disease Mother    Arthritis Father    Aneurysm Father 3       brain   Alzheimer's disease Father    Thyroid  disease Daughter    Stroke Daughter        2020   Atrial fibrillation Son        2020    All past medical history, surgical history, allergies, family history, immunizations andmedications were updated in the EMR today and reviewed under the  history and medication portions of their EMR.     ROS: Negative, with the exception of above mentioned in HPI   Objective:  BP 130/82   Pulse 85   Temp 97.6 F (36.4 C)   Wt 134 lb 6.4 oz (61 kg)   SpO2 97%   BMI 23.25 kg/m  Body mass index is 23.25 kg/m. Physical Exam Vitals and nursing note reviewed.  Constitutional:      General: She is not in acute distress.    Appearance: Normal appearance. She is not ill-appearing, toxic-appearing or diaphoretic.  HENT:     Head: Normocephalic and atraumatic.     Mouth/Throat:     Mouth: Mucous membranes are moist.  Eyes:     General: No scleral icterus.       Right eye: No discharge.        Left eye: No discharge.     Extraocular Movements: Extraocular movements intact.     Conjunctiva/sclera: Conjunctivae normal.     Pupils: Pupils are equal, round, and reactive to light.  Cardiovascular:     Rate and Rhythm: Normal rate and regular rhythm.     Heart sounds: No murmur heard. Pulmonary:     Effort: Pulmonary effort is normal. No respiratory distress.     Breath sounds: Normal breath sounds. No wheezing, rhonchi or rales.  Musculoskeletal:     Cervical back: Neck supple. No tenderness.     Right lower leg: No edema.     Left lower leg: No edema.  Lymphadenopathy:     Cervical: No cervical adenopathy.  Skin:    General: Skin is warm and dry.     Coloration: Skin is not jaundiced or pale.     Findings: No erythema or rash.  Neurological:     Mental Status: She is alert and oriented to person, place, and time. Mental status is at baseline.     Motor: No weakness.     Gait: Gait normal.  Psychiatric:        Mood and Affect: Mood normal.        Behavior: Behavior normal.  Thought Content: Thought content normal.        Judgment: Judgment normal.    No results found. No results found. No results found for this or any previous visit (from the past 24 hours).   Assessment/Plan: Denise Fuller is a 88 y.o. female  present for OV condition management HTN/Mixed hyperlipidemia Stable Dc dyazide and use only if swelling is appreciated.  Continue lisinopril  20 mg a day. Do not recommend monitoring any further at home unless dizziness occurs. Anxiety is driving up home pressures.  CBC, CMP collected today  Hypothyroidism due to acquired atrophy of thyroid  Continue levothyroxine  88 mcg daily. refills will be provided in appropriate dose based on lab result today TSH, T4 free, T3 free  B12 deficiency Continue B12 injections monthly, indefinitely according to schedule. B12 injection provided today  MGUS (monoclonal gammopathy of unknown significance)/Elevated serum immunoglobulin free light chains/Stage 3a chronic kidney disease (HCC) -Routine SPEP, BMP every 6 months.  If  M spike recurs then will refer to oncology at that time (per onc). -Pap and CMP collected today  Overactive bladder (Primary)/hematuria/persistent protease infection Patient had recently established with Dr. Charon Copper, which has now retired.  He would like a female urologist if possible. He had prescribed her 68-month course of Bactrim , estradiol  and 100 taking cranberry supplement as well as a probiotic. She also feels like there is a possibility of her back pain being a UTI, but complains of no other urinary symptoms today. She is going out of town tomorrow, therefore called in Bactrim  twice daily for 7 days and collected urinalysis with reflex culture today. - Ambulatory referral to Urology-referred to Dr. Valeta Gaudier   Lumbar back pain With her exam and HPI, the etiology of her pain sounds more consistent with lumbar back pain with radiculopathy to her right leg.  We discussed this in detail today and elected to do an x-ray of her lumbar spine at [facility].  We will call her with these results. Start diclofenac  75 mg daily with food for 2-4 weeks Start Zanaflex  2 mg nightly She is due for chronic condition appointment in a  couple weeks, encouraged her to make visit and approximately 3 weeks and we will touch base on her lumbar back pain again.  If needed we will obtain advanced imaging at that time and she will be due for her SPEP - DG Lumbar Spine Complete; Future - Urinalysis w microscopic + reflex cultur  Memory change There has been noted decline in patient's cognitive status over the last 2 years.  Her daughter has recently noticed more cognitive decline.  Patient feels she noticed it after she had tick bites recently. Recheck TSH, vitamin D  B12 folate and added Lyme antibodies.  She does receive B12 injections routinely. MRI brain ordered - TSH - Vitamin D  (25 hydroxy) - B12 and Folate Panel - B. burgdorfi antibodies Reviewed expectations re: course of current medical issues. Discussed self-management of symptoms. Outlined signs and symptoms indicating need for more acute intervention. Patient verbalized understanding and all questions were answered. Patient received an After-Visit Summary.   Orders Placed This Encounter  Procedures   MR LUMBAR SPINE W WO CONTRAST   CBC w/Diff   Comp Met (CMET)   Multiple Myeloma Panel (SPEP&IFE w/QIG)   Meds ordered this encounter  Medications   lisinopril  (ZESTRIL ) 20 MG tablet    Sig: Take 1 tablet (20 mg total) by mouth daily.    Dispense:  90 tablet  Refill:  1   mirabegron  ER (MYRBETRIQ ) 25 MG TB24 tablet    Sig: Take 1 tablet (25 mg total) by mouth daily.    Dispense:  30 tablet    Refill:  5   tiZANidine  (ZANAFLEX ) 4 MG tablet    Sig: Take 0.5 tablets (2 mg total) by mouth at bedtime.    Dispense:  90 tablet    Refill:  1   donepezil (ARICEPT) 5 MG tablet    Sig: Take 1 tablet (5 mg total) by mouth at bedtime.    Dispense:  90 tablet    Refill:  1   diclofenac  (VOLTAREN ) 75 MG EC tablet    Sig: Take 1 tablet (75 mg total) by mouth daily. With food    Dispense:  90 tablet    Refill:  1    Referral Orders  No referral(s) requested today      Note is dictated utilizing voice recognition software. Although note has been proof read prior to signing, occasional typographical errors still can be missed. If any questions arise, please do not hesitate to call for verification.   electronically signed by:  Napolean Backbone, DO  [facility] - [place]

## 2023-06-12 NOTE — Patient Instructions (Addendum)
 Return in about 24 weeks (around 11/27/2023) for Routine chronic condition follow-up.  New med: Aricept- take before bed. This helps slow down memory changes.   I ordered an MRI of your lower back to further evaluate the changes seen on xray (compression fracture, curvature) and to be safe since you have MGUS of the kidneys.       Great to see you today.  I have refilled the medication(s) we provide.   If labs were collected or images ordered, we will inform you of  results once we have received them and reviewed. We will contact you either by echart message, or telephone call.  Please give ample time to the testing facility, and our office to run,  receive and review results. Please do not call inquiring of results, even if you can see them in your chart. We will contact you as soon as we are able. If it has been over 1 week since the test was completed, and you have not yet heard from us , then please call us .    - echart message- for normal results that have been seen by the patient already.   - telephone call: abnormal results or if patient has not viewed results in their echart.  If a referral to a specialist was entered for you, please call us  in 2 weeks if you have not heard from the specialist office to schedule.

## 2023-06-13 ENCOUNTER — Encounter: Payer: Self-pay | Admitting: Family Medicine

## 2023-06-13 ENCOUNTER — Ambulatory Visit: Payer: Self-pay

## 2023-06-13 DIAGNOSIS — M51362 Other intervertebral disc degeneration, lumbar region with discogenic back pain and lower extremity pain: Secondary | ICD-10-CM | POA: Insufficient documentation

## 2023-06-13 DIAGNOSIS — M545 Low back pain, unspecified: Secondary | ICD-10-CM | POA: Insufficient documentation

## 2023-06-13 DIAGNOSIS — M4186 Other forms of scoliosis, lumbar region: Secondary | ICD-10-CM | POA: Insufficient documentation

## 2023-06-13 DIAGNOSIS — S22000A Wedge compression fracture of unspecified thoracic vertebra, initial encounter for closed fracture: Secondary | ICD-10-CM | POA: Insufficient documentation

## 2023-06-13 NOTE — Telephone Encounter (Signed)
 Pt made aware

## 2023-06-13 NOTE — Telephone Encounter (Signed)
 Please call patient and inform her We received a call from her daughter, that stated that she is concerned about the side effects of Aricept, her mother is prescribed. Patient does not have to take the Aricept if she does not want to.  Class of medications that are used to help with cognitive decline all carry potential side effects that are similar.  Rarely do  patients have complaints about starting Aricept, or side effects thereof.  -If she has any side effects she feels from the Aricept, she can always discontinue the medication.  Aricept is usually the first-line medicine for cognitive decline

## 2023-06-13 NOTE — Telephone Encounter (Signed)
 Copied from CRM [id number]. Topic: Clinical - Medication Question >> Jun 13, 2023 10:20 AM Alyse July wrote: Reason for CRM: Patient daughter is concerned about the side effects of Aricept and would like to know if there is an alternative with fewer or less severe side effects. Patient daughter mention patient potentially taken OTC Prevagen. Please contact patient daughter to advise. [phone]  Chief Complaint: Aricept; states has not started medications and wants to know what lead to the decision to put her on Aricept with all of its side effects; please call daughter, Archie Bearded at [phone] Symptoms: na Frequency: na Pertinent Negatives: Patient denies na Disposition: [] ED /[] Urgent Care (no appt availability in office) / [] Appointment(In office/virtual)/ []  [place] [facility]/ [] Home Care/ [] Refused Recommended Disposition /[] [place] [facility]/ [x]  Follow-up with PCP Additional Notes: wants call back regarding Aricept. Care advice given, denies questions; instructed to go to ER if becomes worse.   Reason for Disposition  Caller requesting information about medicine during pregnancy; adult is not ill AND triager answers question  Answer Assessment - Initial Assessment Questions 1. NAME of MEDICINE: "What medicine(s) are you calling about?"     Aricept 2. QUESTION: "What is your question?" (e.g., double dose of medicine, side effect)     States has not started it and looked up the side effects. 3. PRESCRIBER: "Who prescribed the medicine?" Reason: if prescribed by specialist, call should be referred to that group.     Dr. Marylee Snowball 4. SYMPTOMS: "Do you have any symptoms?" If Yes, ask: "What symptoms are you having?"  "How bad are the symptoms (e.g., mild, moderate, severe)     No symptoms would like to talk to PCP regarding decision to make Aricept.  5. PREGNANCY:  "Is there any chance that you are pregnant?" "When was your last menstrual period?"     na  Protocols used: Medication  Question Call-A-AH

## 2023-06-15 DIAGNOSIS — U071 COVID-19: Secondary | ICD-10-CM | POA: Diagnosis not present

## 2023-06-17 LAB — MULTIPLE MYELOMA PANEL, SERUM
Albumin SerPl Elph-Mcnc: 3.4 g/dL (ref 2.9–4.4)
Albumin/Glob SerPl: 1.3 (ref 0.7–1.7)
Alpha 1: 0.3 g/dL (ref 0.0–0.4)
Alpha2 Glob SerPl Elph-Mcnc: 0.6 g/dL (ref 0.4–1.0)
B-Globulin SerPl Elph-Mcnc: 1 g/dL (ref 0.7–1.3)
Gamma Glob SerPl Elph-Mcnc: 0.8 g/dL (ref 0.4–1.8)
Globulin, Total: 2.7 g/dL (ref 2.2–3.9)
IgA/Immunoglobulin A, Serum: 142 mg/dL (ref 64–422)
IgG (Immunoglobin G), Serum: 874 mg/dL (ref 586–1602)
IgM (Immunoglobulin M), Srm: 60 mg/dL (ref 26–217)
Total Protein: 6.1 g/dL (ref 6.0–8.5)

## 2023-06-19 ENCOUNTER — Ambulatory Visit (HOSPITAL_BASED_OUTPATIENT_CLINIC_OR_DEPARTMENT_OTHER)

## 2023-07-07 ENCOUNTER — Ambulatory Visit (HOSPITAL_BASED_OUTPATIENT_CLINIC_OR_DEPARTMENT_OTHER)
Admission: RE | Admit: 2023-07-07 | Discharge: 2023-07-07 | Disposition: A | Discharge status: Home / Self Care | Source: Ambulatory Visit | Attending: Family Medicine | Admitting: Family Medicine

## 2023-07-07 DIAGNOSIS — M47816 Spondylosis without myelopathy or radiculopathy, lumbar region: Secondary | ICD-10-CM | POA: Diagnosis not present

## 2023-07-07 DIAGNOSIS — M4186 Other forms of scoliosis, lumbar region: Secondary | ICD-10-CM | POA: Diagnosis not present

## 2023-07-07 DIAGNOSIS — S22000A Wedge compression fracture of unspecified thoracic vertebra, initial encounter for closed fracture: Secondary | ICD-10-CM | POA: Insufficient documentation

## 2023-07-07 DIAGNOSIS — R29890 Loss of height: Secondary | ICD-10-CM | POA: Diagnosis not present

## 2023-07-07 DIAGNOSIS — D472 Monoclonal gammopathy: Secondary | ICD-10-CM | POA: Insufficient documentation

## 2023-07-07 DIAGNOSIS — M48061 Spinal stenosis, lumbar region without neurogenic claudication: Secondary | ICD-10-CM | POA: Diagnosis not present

## 2023-07-07 DIAGNOSIS — M545 Low back pain, unspecified: Secondary | ICD-10-CM | POA: Insufficient documentation

## 2023-07-07 DIAGNOSIS — M5136 Other intervertebral disc degeneration, lumbar region with discogenic back pain only: Secondary | ICD-10-CM | POA: Diagnosis not present

## 2023-07-07 MED ORDER — GADOBUTROL 1 MMOL/ML IV SOLN
6.0000 mL | Freq: Once | INTRAVENOUS | Status: AC | PRN
  Administered 2023-07-07: 6 mL via INTRAVENOUS

## 2023-07-11 ENCOUNTER — Telehealth: Payer: Self-pay

## 2023-07-11 NOTE — Telephone Encounter (Signed)
 Reason for CRM: Patient called in regarding her Lumbar Spine results, would like for someone to give her a callback regarding this   Called and spoke with pt. Advised pt that we had no yet received the results for her recent imaging, but when we do, someone will contact her to discuss. Pt understood.

## 2023-07-12 ENCOUNTER — Ambulatory Visit

## 2023-07-12 DIAGNOSIS — E538 Deficiency of other specified B group vitamins: Secondary | ICD-10-CM

## 2023-07-12 MED ORDER — CYANOCOBALAMIN 1000 MCG/ML IJ SOLN
1000.0000 ug | Freq: Once | INTRAMUSCULAR | Status: AC
  Administered 2023-07-12: 1000 ug via INTRAMUSCULAR

## 2023-07-12 NOTE — Progress Notes (Signed)
Pt here for monthly B12 injection per Dr.[name]   B12 1000mcg given IM, and pt tolerated injection well.   Next B12 injection scheduled for 1 month.       

## 2023-07-16 NOTE — Telephone Encounter (Signed)
 Communication  Reason for CRM: Patient called to follow up on MRI results from 07/07/23    Called and spoke with pt. Advised pt that we had no yet received the results for her recent imaging, but when we do, someone will contact her to discuss. Pt understood.

## 2023-07-24 ENCOUNTER — Ambulatory Visit: Payer: Self-pay | Admitting: Family Medicine

## 2023-07-24 DIAGNOSIS — M51362 Other intervertebral disc degeneration, lumbar region with discogenic back pain and lower extremity pain: Secondary | ICD-10-CM

## 2023-07-24 DIAGNOSIS — M4186 Other forms of scoliosis, lumbar region: Secondary | ICD-10-CM

## 2023-07-24 DIAGNOSIS — S22000A Wedge compression fracture of unspecified thoracic vertebra, initial encounter for closed fracture: Secondary | ICD-10-CM

## 2023-07-24 DIAGNOSIS — M48062 Spinal stenosis, lumbar region with neurogenic claudication: Secondary | ICD-10-CM | POA: Insufficient documentation

## 2023-07-24 DIAGNOSIS — M48061 Spinal stenosis, lumbar region without neurogenic claudication: Secondary | ICD-10-CM | POA: Insufficient documentation

## 2023-07-24 DIAGNOSIS — M545 Low back pain, unspecified: Secondary | ICD-10-CM

## 2023-07-24 NOTE — Telephone Encounter (Signed)
 Please call patient: Her MRI results have been read by radiology He has multiple levels of lumbar disc bulging, multilevel nerve impingement and mild spinal canal stenosis at L3-4. -With the evidence of spinal stenosis and continued symptoms of back pain that can radiate to her legs, we need to get her referred to a neurosurgeon for further recommendations moving forward.   They will review her MRI and provide their recommendations on if she needs to continue with medications, surgical candidate or may be candidate for nerve injection for relief.

## 2023-08-05 DIAGNOSIS — N3020 Other chronic cystitis without hematuria: Secondary | ICD-10-CM | POA: Diagnosis not present

## 2023-09-05 DIAGNOSIS — H353112 Nonexudative age-related macular degeneration, right eye, intermediate dry stage: Secondary | ICD-10-CM | POA: Diagnosis not present

## 2023-09-05 DIAGNOSIS — H353123 Nonexudative age-related macular degeneration, left eye, advanced atrophic without subfoveal involvement: Secondary | ICD-10-CM | POA: Diagnosis not present

## 2023-09-05 DIAGNOSIS — H35423 Microcystoid degeneration of retina, bilateral: Secondary | ICD-10-CM | POA: Diagnosis not present

## 2023-09-05 DIAGNOSIS — H35033 Hypertensive retinopathy, bilateral: Secondary | ICD-10-CM | POA: Diagnosis not present

## 2023-09-05 DIAGNOSIS — H43813 Vitreous degeneration, bilateral: Secondary | ICD-10-CM | POA: Diagnosis not present

## 2023-09-12 ENCOUNTER — Ambulatory Visit (INDEPENDENT_AMBULATORY_CARE_PROVIDER_SITE_OTHER)

## 2023-09-12 DIAGNOSIS — E538 Deficiency of other specified B group vitamins: Secondary | ICD-10-CM | POA: Diagnosis not present

## 2023-09-12 MED ORDER — CYANOCOBALAMIN 1000 MCG/ML IJ SOLN
1000.0000 ug | Freq: Once | INTRAMUSCULAR | Status: AC
  Administered 2023-09-12: 1000 ug via INTRAMUSCULAR

## 2023-09-12 NOTE — Progress Notes (Signed)
 Pt here for monthly B12 injection per Dr. Catherine  Last B12 injection: 07/12/23  B12 1000mcg given IM, and pt tolerated injection well.  Next B12 injection scheduled for: 1 month

## 2023-09-16 ENCOUNTER — Ambulatory Visit: Admitting: Family Medicine

## 2023-09-16 ENCOUNTER — Encounter: Payer: Self-pay | Admitting: Family Medicine

## 2023-09-16 VITALS — BP 130/80 | HR 79 | Temp 98.0°F | Wt 128.0 lb

## 2023-09-16 DIAGNOSIS — M51362 Other intervertebral disc degeneration, lumbar region with discogenic back pain and lower extremity pain: Secondary | ICD-10-CM | POA: Diagnosis not present

## 2023-09-16 DIAGNOSIS — S22000A Wedge compression fracture of unspecified thoracic vertebra, initial encounter for closed fracture: Secondary | ICD-10-CM | POA: Diagnosis not present

## 2023-09-16 DIAGNOSIS — M545 Low back pain, unspecified: Secondary | ICD-10-CM

## 2023-09-16 DIAGNOSIS — M48061 Spinal stenosis, lumbar region without neurogenic claudication: Secondary | ICD-10-CM | POA: Diagnosis not present

## 2023-09-16 DIAGNOSIS — M48062 Spinal stenosis, lumbar region with neurogenic claudication: Secondary | ICD-10-CM

## 2023-09-16 DIAGNOSIS — M4186 Other forms of scoliosis, lumbar region: Secondary | ICD-10-CM | POA: Diagnosis not present

## 2023-09-16 MED ORDER — DICLOFENAC SODIUM 75 MG PO TBEC: 75.0000 mg | DELAYED_RELEASE_TABLET | Freq: Every day | ORAL | 1 refills | Status: DC

## 2023-09-16 MED ORDER — TIZANIDINE HCL 4 MG PO TABS: 2.0000 mg | ORAL_TABLET | Freq: Every day | ORAL | 1 refills | Status: AC

## 2023-09-16 MED ORDER — DONEPEZIL HCL 10 MG PO TABS: 10.0000 mg | ORAL_TABLET | Freq: Every day | ORAL | 1 refills | Status: DC

## 2023-09-16 NOTE — Progress Notes (Signed)
 Denise Fuller , Jul 01, 1935, 88 y.o., female MRN: [id number] Patient Care Team    Relationship Specialty Notifications Start End  Catherine Charlies LABOR, DO PCP - General Family Medicine  01/10/16   Elspeth Lauraine DEL, OD Referring Physician   01/10/16    Comment: ophth- macular degeneration  Vannie Hoover  Dentistry  04/26/17   Shona Layman BROCKS, MD Referring Physician Urology  12/20/22     Chief Complaint  Patient presents with   Back Pain    Ongoing lower back pain. Pt did hear from Neurosurgeon but could not go due to office being in [place].      Subjective: Denise Fuller is a 88 y.o. female present for Chronic Conditions/illness Management All past medical history, surgical history, allergies, family history, immunizations and social history was obtained from the patient today and entered into the electronic medical record.    Patient presents today to discuss ongoing lower back pain.  She was seen a few months ago by this provider and prescribed diclofenac  and Zanaflex .  She reports that Zanaflex  does help and she takes a half a tab before bed.  She had forgotten about the diclofenac  and was not taking.  MRI was completed, with multiple level lumbar disc bulging and nerve impingement.  Mild spinal canal stenosis at L3-4.  She was referred to neurosurgery, unfortunately the referral went to [facility] and that is too far for her to travel.  Patient reports back pain is worse when increasing activity even bending over and getting in her car laying flat at night.  Prior panel Patient reports the use of the diclofenac  and half a tab of Zanaflex  nightly has helped her lower back.  X-ray of the lower back resulted with mild levoscoliosis, lower thoracic compression fracture, and degenerative changes of lumbar spine Patient reports she still has some lower back discomfort but has improved.  She has a significant medical history of MGUS.  Prior note: Patient points to her lower lumbar area as  location of discomfort on both sides of her spine.  Pain has been present for approximately 6 weeks . she states the discomfort is worse if she sits for a while or when standing long periods of time.  She endorses having a tingling sensation on the right lateral aspect of her calf.  She denies any recent injury or overactivity.  She has a history of compression fracture T12 and mild to moderate degenerative changes of the lumbar spine with degenerative levoscoliosis mid lumbar spine according to x-ray 06/2019.  She has had no spinal surgery in the past Patient has a significant history of MGUS, due for her every 59-month follow-up in a couple 4 weeks for labs.  Cognitive decline/b12 def: She has received B12 injections as indicated and has noticed an improvement.  . Started Aricept  5 mg before bed, and is wondering if this could be increased. Prior note: Pt presents for an OV with her daughter today to discuss cognitive changes that have been noticed over the last year. Pt reports she has noticed she is more forgetful of appts and details of conversations. Her daughter reports the family noticed changes a little of a year ago. Started with forgetting having conversations. Daughter reports last couple of months there has been changes in her cooking, forgetting appts, even forgetting she wrote down appts, more details of conversations. When she is told she forgot she has started to become more elevated. She has h/o b12 def and thyroid  disorder. She  reports compliance with levothyroxine  and monthly b12 inj.  She has had a h/o of abnormal spep, which has been stable. No h/o  stroke, but endorses today her eye doctor told her she was having mini-strokes of her eye. She takes a baby ASA and BP is well controlled on medication.   Prior note Patient reports she has noticed her memory declining over a few years, but her daughter noticed recently that she has had a rapid decline in her memory and brought it to  her attention.  She is taking vitamin D , B12 and just start Prevagen supplement. MRI completed/resulted with chronic small vessel ischemic changes within the cerebral white matter and pons of moderate severity.  Otherwise normal.  MRI 07/07/2023 FINDINGS: Segmentation: 5 non-rib-bearing lumbar type vertebral bodies. Lowest well-formed disc space is labeled L5-S1.   Alignment: Lumbar lordosis is maintained. Moderate levocurvature of the lumbar spine centered at L2 with additional mild dextrocurvature in the lower lumbar spine centered at L4-5.   Vertebrae: No bone marrow edema or evidence of acute fracture. Chronic irregularity of the T12 superior endplate with approximately 20% height loss anteriorly. A portion of the T12 superior endplate irregularity is likely due to Schmorl's node. Vertebral body heights otherwise maintained. Degenerative endplate changes most pronounced along the left aspect of L4-5. Slightly limited evaluation of the lower thoracic and upper lumbar spine on sagittal postcontrast images due to artifact. Otherwise no abnormal enhancement or suspicious osseous lesion.   Conus medullaris and cauda equina: Conus extends to the L1-2 level. Conus and cauda equina appear normal.   Paraspinal and other soft tissues: The paraspinal soft tissues are unremarkable. There is diverticulosis along the partially visualized descending colon.   Disc levels:   T12-L1: Small disc bulge. No significant spinal canal or foraminal stenosis.   L1-2: Mild disc height loss. Diffuse disc bulge and posterior osteophytes which indent the ventral thecal sac with mild lateral recess narrowing on the right without significant impingement upon the traversing nerve roots. Mild facet arthrosis. No significant spinal canal stenosis. Mild foraminal stenosis on the right.   L2-3: Mild disc height loss. Diffuse disc bulge. Mild lateral recess narrowing without impingement upon the traversing  nerve roots. Mild facet arthrosis. Mild spinal canal stenosis. Mild-to-moderate right and mild left foraminal stenosis.   L3-4: Diffuse disc bulge. Bilateral facet arthrosis and mild thickening of the ligamentum flavum which indents the dorsal thecal sac. No significant spinal canal stenosis. There is mild-to-moderate right and mild left foraminal stenosis.   L4-5: Moderate disc height loss. Diffuse disc bulge and posterior osteophytes. Lateral recess narrowing greater on the left. Bilateral facet arthrosis indents the dorsal thecal sac. Possible impingement upon the traversing left L5 nerve roots. No significant spinal canal stenosis. There is mild right and moderate left foraminal stenosis.   L5-S1: Small disc bulge. Bilateral facet arthrosis. No significant spinal canal stenosis. Moderate bilateral foraminal stenosis.   IMPRESSION: Chronic compression deformity of T12 with approximately 20% height loss anteriorly. No acute fracture.   Within limitations of artifact in the lower thoracic and upper lumbar spine there is no suspicious osseous lesion noted.   Degenerative changes as above. Lateral recess narrowing at multiple levels. Disc bulge at L4-5 resulting in possible impingement upon the traversing left L5 nerve roots.   Multilevel foraminal stenosis, greatest and moderate on the left at L4-5 and bilaterally at L5-S1.   Scoliosis Lumbar x-ray 05/22/2023: FINDINGS: Five lumbar type vertebral bodies are well visualized. Vertebral body height is well  maintained with the exception of T12 which again shows a compression deformity stable from the prior exam. No anterolisthesis is noted. Disc space narrowing is noted from L4-S1. No soft tissue changes are seen. No pedicle abnormality is noted. Mild degenerative scoliosis is noted concave to the right. IMPRESSION: Stable appearance of the lumbar spine when compared with the prior Study  MRI of the brain was completed  05/22/2023 IMPRESSION: 1. No evidence of an acute intracranial abnormality. 2. No age-advanced or lobar predominant cerebral atrophy. 3. Chronic small vessel ischemic changes within the cerebral white matter and pons, overall moderate in severity.  Renal US  01/2023 FINDINGS: Right Kidney: Renal measurements: 9.5 x 3.9 x 4.3 cm = volume: 82.2 mL. Echogenicity within normal limits. No mass or hydronephrosis visualized. Left Kidney: Renal measurements: 10.2 x 4.8 x 4.1 cm = volume: 100.8 mL. Echogenicity within normal limits. No mass or hydronephrosis visualized. Bladder: Appears normal for degree of bladder distention. IMPRESSION: No hydronephrosis.     06/2019- lumbar xray FINDINGS: There is a degenerative levoscoliosis centered in the mid lumbar spine. Mild-to-moderate degenerative changes are noted throughout the lumbar spine, greatest in the lower lumbar segments were there is moderate to severe disc height loss and mild to moderate facet arthrosis. There is an age-indeterminate compression fracture of the T12 vertebral body with approximately 40% height loss anteriorly.   IMPRESSION: 1. Age-indeterminate compression fracture of the T12 vertebral body. 2. Mild to moderate multilevel degenerative changes of the lumbar spine. 3. Degenerative levoscoliosis centered in the mid lumbar spine.     06/12/2023    9:09 AM 04/02/2022    1:13 PM 03/29/2021    9:44 AM 02/10/2021    4:07 PM 03/23/2020    9:03 AM  Depression screen PHQ 2/9  Decreased Interest 0 0 0 0 0  Down, Depressed, Hopeless 0 0 0 0 0  PHQ - 2 Score 0 0 0 0 0  Altered sleeping 0      Tired, decreased energy 0      Change in appetite 0      Feeling bad or failure about yourself  0      Trouble concentrating 0      Moving slowly or fidgety/restless 0      Suicidal thoughts 0      PHQ-9 Score 0      Difficult doing work/chores Not difficult at all        Allergies  Allergen Reactions   Doxycycline     GI  upset   Keflex [Cephalexin] Hives and Itching   Iron Rash   Social History   Social History Narrative   Retired. Married to [place]. They have 3 children Denise Mickey Arnulfo [name])   Moved from [place]  05/2014.    Has 2 older dogs.    Retired.    Drinks caffeine, takes herbal remedies and dialy vitamin.   Wears her seatbelt, smoke detector in the home   Wears dentures, no assistive devices for walking - independent.    Feels safe in her relationships.    Past Medical History:  Diagnosis Date   Abnormal SPEP 07/16/2019   B12 deficiency 07/16/2019   Bartonella infection    BCC (basal cell carcinoma of skin) 1997   nose   Chickenpox    Chronic bilateral low back pain without sciatica 06/23/2019   Chronic venous insufficiency    Diverticulosis    Dyspnea on exertion 07/10/2019   Elevated alkaline phosphatase level    Elevated LDH  Elevated serum immunoglobulin free light chains 07/21/2019   Environmental allergies    GAD (generalized anxiety disorder)    Goals of care, counseling/discussion 08/28/2019   Groin abscess    culture negative, doxy resolved abscess   Hepatitis A    Hyperlipidemia    Hypertension    Hypothyroidism    Ingrown nail 04/10/2018   Lightheadedness 12/03/2018   Lymphadenopathy, cervical    chronic per pt. (left)   Macular degeneration    Meniere disease    MGUS (monoclonal gammopathy of unknown significance) 08/28/2019   Other fatigue 12/03/2018   Overweight (BMI 25.0-29.9) 04/10/2018   Palpitations 12/03/2018   Thyroid  disease    Weakness generalized 12/03/2018   Past Surgical History:  Procedure Laterality Date   ABDOMINAL HYSTERECTOMY  1991   APPENDECTOMY  1967   BASAL CELL CARCINOMA EXCISION  1997   nose   BLEPHAROPLASTY     CATARACT EXTRACTION W/ INTRAOCULAR LENS IMPLANT Bilateral Oct and Nov 2018   CHOLECYSTECTOMY  1967   ORIF WRIST FRACTURE Left 05/18/2020   Family History  Problem Relation Age of Onset   Arthritis Mother    Heart disease  Mother    Arthritis Father    Aneurysm Father 55       brain   Alzheimer's disease Father    Thyroid  disease Daughter    Stroke Daughter        2020   Atrial fibrillation Son        2020    All past medical history, surgical history, allergies, family history, immunizations andmedications were updated in the EMR today and reviewed under the history and medication portions of their EMR.     ROS: Negative, with the exception of above mentioned in HPI   Objective:  BP 130/80   Pulse 79   Temp 98 F (36.7 C)   Wt 128 lb (58.1 kg)   SpO2 98%   BMI 22.14 kg/m  Body mass index is 22.14 kg/m. Physical Exam Vitals and nursing note reviewed.  Constitutional:      General: She is not in acute distress.    Appearance: Normal appearance. She is not ill-appearing, toxic-appearing or diaphoretic.  HENT:     Head: Normocephalic and atraumatic.     Mouth/Throat:     Mouth: Mucous membranes are moist.  Eyes:     General: No scleral icterus.       Right eye: No discharge.        Left eye: No discharge.     Extraocular Movements: Extraocular movements intact.     Conjunctiva/sclera: Conjunctivae normal.     Pupils: Pupils are equal, round, and reactive to light.  Cardiovascular:     Rate and Rhythm: Normal rate and regular rhythm.     Heart sounds: No murmur heard. Pulmonary:     Effort: Pulmonary effort is normal. No respiratory distress.     Breath sounds: Normal breath sounds. No wheezing, rhonchi or rales.  Musculoskeletal:        General: Tenderness present.     Cervical back: Neck supple. No tenderness.     Right lower leg: No edema.     Left lower leg: No edema.  Lymphadenopathy:     Cervical: No cervical adenopathy.  Skin:    General: Skin is warm and dry.     Coloration: Skin is not jaundiced or pale.     Findings: No erythema or rash.  Neurological:     Mental Status: She is  alert and oriented to person, place, and time. Mental status is at baseline.     Motor: No  weakness.     Gait: Gait normal.  Psychiatric:        Mood and Affect: Mood normal.        Behavior: Behavior normal.        Thought Content: Thought content normal.        Judgment: Judgment normal.    No results found. No results found. No results found for this or any previous visit (from the past 24 hours).   Assessment/Plan: Kiona D Bonano is a 88 y.o. female present for OV  Lumbar back pain/MGUS/thoracic compression fracture/lumbar degenerative disc disease/levoscoliosis lumbar spine Some improvement with medications, however still present with radiculopathy. Continue diclofenac  75 mg daily in the evening with food  Continue Zanaflex  2 mg nightly as needed - DG Lumbar Spine Complete: Completed with abnormalities Discussed MRI of the lumbar spine with patient today.  She has seen some improvement with medication management, but symptoms have remained.  She also has a history of MGUS, would be safe to move forward with MRI of her lumbar spine  Memory change/cognitive decline There has been noted decline in patient's cognitive status over the last 2 years.  Her daughter has recently noticed more cognitive decline.  Labs unrevealing TSH, vitamin D , B12, folate, Lyme titers and MRI brain: Overall reassuring.  However patient does have moderate small ischemic vessel disease per MRI Patient would like to increase the Aricept  to 10 mg before bed today. Could consider referral to neurology if desiring next appointment, will wait until she establishes with neurosurgeon in order to decrease any potential confusion.   Reviewed expectations re: course of current medical issues. Discussed self-management of symptoms. Outlined signs and symptoms indicating need for more acute intervention. Patient verbalized understanding and all questions were answered. Patient received an After-Visit Summary.   No orders of the defined types were placed in this encounter.  No orders of the defined  types were placed in this encounter.   Referral Orders  No referral(s) requested today     Note is dictated utilizing voice recognition software. Although note has been proof read prior to signing, occasional typographical errors still can be missed. If any questions arise, please do not hesitate to call for verification.   electronically signed by:  Charlies Bellini, DO  [facility] - [place]

## 2023-09-16 NOTE — Patient Instructions (Addendum)
 Increased aricept dose to 10 mg before bed for memory.  Take tizanidine 1/2 tab before bed.  Take diclofenac 1 tab daily with food.   Referred you to a LOCAL neurosurgeon for your back.

## 2023-09-24 DIAGNOSIS — M8008XD Age-related osteoporosis with current pathological fracture, vertebra(e), subsequent encounter for fracture with routine healing: Secondary | ICD-10-CM | POA: Diagnosis not present

## 2023-09-24 DIAGNOSIS — Z1330 Encounter for screening examination for mental health and behavioral disorders, unspecified: Secondary | ICD-10-CM | POA: Diagnosis not present

## 2023-09-24 DIAGNOSIS — M41126 Adolescent idiopathic scoliosis, lumbar region: Secondary | ICD-10-CM | POA: Diagnosis not present

## 2023-09-25 ENCOUNTER — Ambulatory Visit: Payer: Self-pay | Admitting: Family Medicine

## 2023-09-25 NOTE — Telephone Encounter (Signed)
Send to wrong office.

## 2023-09-25 NOTE — Telephone Encounter (Signed)
 FYI Only or Action Required?: FYI only for provider.  Patient was last seen in primary care on 09/16/2023 by Catherine Fuller A, DO.  Called Nurse Triage reporting Hematuria.  Symptoms began several months ago.  Symptoms are: gradually worsening.  Triage Disposition: See Physician Within 24 Hours  Patient/caregiver understands and will follow disposition?: Yes                 Copied from CRM #[id number]. Topic: Clinical - Red Word Triage >> Sep 25, 2023  9:21 AM Vena HERO wrote: Red Word that prompted transfer to Nurse Triage: pt states she may have UTI and blood in her urine. Wants to have urine checked Reason for Disposition  Side (flank) or back pain present  Answer Assessment - Initial Assessment Questions This RN scheduled pt for first available appt in office on Fri, 8/22, with PCP as this has been an ongoing issue for pt. This RN educated pt on new-worsening symptoms and when to call back/seek emergent care. Pt verbalized understanding and agrees to plan.     COLOR of URINE: Describe the color of the urine.  (e.g., tea-colored, pink, red, bloody) Do you have blood clots in your urine? (e.g., none, pea, grape, small coin)     Blood on pad; like a rub  ONSET: When did the bleeding start?      Been a while; a few months  EPISODES: How many times has there been blood in the urine? or How many times today?     Lately every day  PAIN with URINATION: Is there any pain with passing your urine? If Yes, ask: How bad is the pain?  (Scale 1-10; or mild, moderate, severe)     No  FEVER: Do you have a fever? If Yes, ask: What is your temperature, how was it measured, and when did it start?     No  ASSOCIATED SYMPTOMS: Are you passing urine more frequently than usual?     No  OTHER SYMPTOMS: Do you have any other symptoms? (e.g., back/flank pain, abdomen pain, vomiting)     Left lower back pain if I do a lot (like vacuuming); denies abdomen  pain, nausea, vomiting  Protocols used: Urine - Blood In-A-AH

## 2023-09-27 ENCOUNTER — Other Ambulatory Visit (HOSPITAL_COMMUNITY)
Admission: RE | Admit: 2023-09-27 | Discharge: 2023-09-27 | Disposition: A | Discharge status: Home / Self Care | Source: Ambulatory Visit | Attending: Family Medicine | Admitting: Family Medicine

## 2023-09-27 ENCOUNTER — Ambulatory Visit (INDEPENDENT_AMBULATORY_CARE_PROVIDER_SITE_OTHER): Admitting: Family Medicine

## 2023-09-27 ENCOUNTER — Encounter: Payer: Self-pay | Admitting: Family Medicine

## 2023-09-27 VITALS — BP 130/80 | HR 72 | Temp 97.9°F | Wt 126.0 lb

## 2023-09-27 DIAGNOSIS — Z113 Encounter for screening for infections with a predominantly sexual mode of transmission: Secondary | ICD-10-CM | POA: Insufficient documentation

## 2023-09-27 DIAGNOSIS — N950 Postmenopausal bleeding: Secondary | ICD-10-CM | POA: Diagnosis not present

## 2023-09-27 LAB — CBC WITH DIFFERENTIAL/PLATELET
Basophils Absolute: 0.1 K/uL (ref 0.0–0.1)
Basophils Relative: 0.8 % (ref 0.0–3.0)
Eosinophils Absolute: 0.2 K/uL (ref 0.0–0.7)
Eosinophils Relative: 2.2 % (ref 0.0–5.0)
HCT: 40.6 % (ref 36.0–46.0)
Hemoglobin: 13.1 g/dL (ref 12.0–15.0)
Lymphocytes Relative: 28.8 % (ref 12.0–46.0)
Lymphs Abs: 2.1 K/uL (ref 0.7–4.0)
MCHC: 32.3 g/dL (ref 30.0–36.0)
MCV: 92.9 fl (ref 78.0–100.0)
Monocytes Absolute: 0.9 K/uL (ref 0.1–1.0)
Monocytes Relative: 11.8 % (ref 3.0–12.0)
Neutro Abs: 4.1 K/uL (ref 1.4–7.7)
Neutrophils Relative %: 56.4 % (ref 43.0–77.0)
Platelets: 279 K/uL (ref 150.0–400.0)
RBC: 4.37 Mil/uL (ref 3.87–5.11)
RDW: 14.2 % (ref 11.5–15.5)
WBC: 7.2 K/uL (ref 4.0–10.5)

## 2023-09-27 LAB — HEMOCCULT GUIAC POC 1CARD (OFFICE)
Card #1 Date: 82225
Fecal Occult Blood, POC: NEGATIVE

## 2023-09-27 NOTE — Patient Instructions (Addendum)
 No follow-ups on file.  Gynecology referral placed for you today.       Great to see you today.  I have refilled the medication(s) we provide.   If labs were collected or images ordered, we will inform you of  results once we have received them and reviewed. We will contact you either by echart message, or telephone call.  Please give ample time to the testing facility, and our office to run,  receive and review results. Please do not call inquiring of results, even if you can see them in your chart. We will contact you as soon as we are able. If it has been over 1 week since the test was completed, and you have not yet heard from us , then please call us .    - echart message- for normal results that have been seen by the patient already.   - telephone call: abnormal results or if patient has not viewed results in their echart.  If a referral to a specialist was entered for you, please call us  in 2 weeks if you have not heard from the specialist office to schedule.

## 2023-09-27 NOTE — Progress Notes (Signed)
 Denise Fuller , Jan 28, 1936, 88 y.o., female MRN: [id number] Patient Care Team    Relationship Specialty Notifications Start End  Catherine Charlies LABOR, DO PCP - General Family Medicine  01/10/16   Elspeth Lauraine DEL, OD Referring Physician   01/10/16    Comment: ophth- macular degeneration  Vannie Hoover  Dentistry  04/26/17   Shona Layman BROCKS, MD Referring Physician Urology  12/20/22     Chief Complaint  Patient presents with   Rectal Bleeding    Pt states due to recently stopping Metamucil, constipation has increased causing blood in stool/ in pt underwear. No urinary concern.  Pt is now taking Metamucil again.      Subjective: Denise Fuller is a 88 y.o. Pt presents for an OV with complaints of rectal bleeding, dark red/brown. Bleeding which she reports started about 2-3 weeks ago.  She reports she was told she needed to take Metamucil for the rest of her life due to a very small anal exit.  She reports she had been taking Metamucil for very long time until about a month ago if she stopped the Metamucil and noticed more constipation.  She restarted Metamucil 1 week ago. She reports blood is on her padding every day for the last 2-3 weeks.  She denies any bladder symptoms.  She does have recurrent UTIs.  She has not noticed blood on her toilet paper after bowel movement.  She reports she notices blood on her packing and her active her even when not having a bowel movement that day.  She thinks that the blood is coming from her rectum.  She reports she occasionally will have a little bleeding after a bowel movement every once in a while.  She denies any current pain with bowel movements. Patient has had a hysterectomy.  She reports she is sexually active with her husband, but not recently.     06/12/2023    9:09 AM 04/02/2022    1:13 PM 03/29/2021    9:44 AM 02/10/2021    4:07 PM 03/23/2020    9:03 AM  Depression screen PHQ 2/9  Decreased Interest 0 0 0 0 0  Down, Depressed, Hopeless 0 0 0 0 0   PHQ - 2 Score 0 0 0 0 0  Altered sleeping 0      Tired, decreased energy 0      Change in appetite 0      Feeling bad or failure about yourself  0      Trouble concentrating 0      Moving slowly or fidgety/restless 0      Suicidal thoughts 0      PHQ-9 Score 0      Difficult doing work/chores Not difficult at all        Allergies  Allergen Reactions   Doxycycline     GI upset   Keflex [Cephalexin] Hives and Itching   Iron Rash   Social History   Social History Narrative   Retired. Married to [employer]aks. They have 3 children Lawrance Mickey Arnulfo [name])   Moved from [place]  05/2014.    Has 2 older dogs.    Retired.    Drinks caffeine, takes herbal remedies and dialy vitamin.   Wears her seatbelt, smoke detector in the home   Wears dentures, no assistive devices for walking - independent.    Feels safe in her relationships.    Past Medical History:  Diagnosis Date   Abnormal SPEP 07/16/2019  B12 deficiency 07/16/2019   Bartonella infection    BCC (basal cell carcinoma of skin) 1997   nose   Chickenpox    Chronic bilateral low back pain without sciatica 06/23/2019   Chronic venous insufficiency    Diverticulosis    Dyspnea on exertion 07/10/2019   Elevated alkaline phosphatase level    Elevated LDH    Elevated serum immunoglobulin free light chains 07/21/2019   Environmental allergies    GAD (generalized anxiety disorder)    Goals of care, counseling/discussion 08/28/2019   Groin abscess    culture negative, doxy resolved abscess   Hepatitis A    Hyperlipidemia    Hypertension    Hypothyroidism    Ingrown nail 04/10/2018   Lightheadedness 12/03/2018   Lymphadenopathy, cervical    chronic per pt. (left)   Macular degeneration    Meniere disease    MGUS (monoclonal gammopathy of unknown significance) 08/28/2019   Other fatigue 12/03/2018   Overweight (BMI 25.0-29.9) 04/10/2018   Palpitations 12/03/2018   Thyroid  disease    Weakness generalized 12/03/2018   Past Surgical  History:  Procedure Laterality Date   ABDOMINAL HYSTERECTOMY  1991   APPENDECTOMY  1967   BASAL CELL CARCINOMA EXCISION  1997   nose   BLEPHAROPLASTY     CATARACT EXTRACTION W/ INTRAOCULAR LENS IMPLANT Bilateral Oct and Nov 2018   CHOLECYSTECTOMY  1967   ORIF WRIST FRACTURE Left 05/18/2020   Family History  Problem Relation Age of Onset   Arthritis Mother    Heart disease Mother    Arthritis Father    Aneurysm Father 19       brain   Alzheimer's disease Father    Thyroid  disease Daughter    Stroke Daughter        2020   Atrial fibrillation Son        2020   Allergies as of 09/27/2023       Reactions   Doxycycline    GI upset   Keflex [cephalexin] Hives, Itching   Iron Rash        Medication List        Accurate as of September 27, 2023 12:31 PM. If you have any questions, ask your nurse or doctor.          aspirin EC 81 MG tablet Take 81 mg by mouth daily.   B-12 Compliance Injection 1000 MCG/ML Kit Generic drug: Cyanocobalamin  Inject 1,000 mcg as directed every 30 (thirty) days. 08/28/2019 Weekly   diclofenac  75 MG EC tablet Commonly known as: VOLTAREN  Take 1 tablet (75 mg total) by mouth daily. With food   donepezil  10 MG tablet Commonly known as: ARICEPT  Take 1 tablet (10 mg total) by mouth at bedtime.   estradiol  0.1 MG/GM vaginal cream Commonly known as: ESTRACE  Apply to vaginal area using a pea size amount on finger tip 3x weekly   FLAX SEED OIL PO Take 1,300 mg by mouth 2 (two) times daily.   levothyroxine  88 MCG tablet Commonly known as: SYNTHROID  Take 1 tablet (88 mcg total) by mouth daily before breakfast.   lisinopril  20 MG tablet Commonly known as: ZESTRIL  Take 1 tablet (20 mg total) by mouth daily.   mirabegron  ER 25 MG Tb24 tablet Commonly known as: MYRBETRIQ  Take 1 tablet (25 mg total) by mouth daily.   MULTIPLE VITAMINS/WOMENS PO Take 1 tablet by mouth daily. Unknown strength   PRESERVISION AREDS PO Take by mouth.    sulfamethoxazole -trimethoprim  400-80 MG tablet Commonly known  as: BACTRIM  Take 1 tablet by mouth at bedtime.   SYSTANE OP Apply 1 drop to eye as needed (Eye dryness).   tiZANidine  4 MG tablet Commonly known as: Zanaflex  Take 0.5 tablets (2 mg total) by mouth at bedtime.   Vitamin D3 25 MCG (1000 UT) Caps Take 1 capsule by mouth daily. 2,000 units daily        All past medical history, surgical history, allergies, family history, immunizations andmedications were updated in the EMR today and reviewed under the history and medication portions of their EMR.     ROS Negative, with the exception of above mentioned in HPI   Objective:  BP 130/80   Pulse 72   Temp 97.9 F (36.6 C)   Wt 126 lb (57.2 kg)   SpO2 96%   BMI 21.80 kg/m  Body mass index is 21.8 kg/m. Physical Exam Vitals and nursing note reviewed. Exam conducted with a chaperone present.  Constitutional:      General: She is not in acute distress.    Appearance: Normal appearance. She is normal weight. She is not ill-appearing or toxic-appearing.  HENT:     Head: Normocephalic and atraumatic.  Eyes:     General: No scleral icterus.       Right eye: No discharge.        Left eye: No discharge.     Extraocular Movements: Extraocular movements intact.     Conjunctiva/sclera: Conjunctivae normal.     Pupils: Pupils are equal, round, and reactive to light.  Genitourinary:     Comments: Position: Laying on left side right knee bent and hip flexed. Vaginal: Vaginal swabbing with bright red blood Rectal: Internal hemorrhoids present.  FOBT negative. Buttocks: Hypopigmentation perineum and surrounding rectal area, with bilateral purpura Skin:    Findings: No rash.  Neurological:     Mental Status: She is alert and oriented to person, place, and time. Mental status is at baseline.     Motor: No weakness.     Coordination: Coordination normal.     Gait: Gait normal.  Psychiatric:        Mood and Affect: Mood  normal.        Behavior: Behavior normal.        Thought Content: Thought content normal.        Judgment: Judgment normal.      No results found. No results found. Results for orders placed or performed in visit on 09/27/23 (from the past 24 hours)  POCT Occult Blood Stool     Status: None   Collection Time: 09/27/23 11:57 AM  Result Value Ref Range   Fecal Occult Blood, POC Negative Negative   Card #1 Date 82225    Card #2 Fecal Occult Blod, POC     Card #2 Date     Card #3 Fecal Occult Blood, POC     Card #3 Date      Assessment/Plan: Denise Fuller is a 88 y.o. female present for OV for  Abnormal vaginal bleeding in postmenopausal patient (Primary) Exam today is positive for abnormal vaginal bleeding in postmenopausal patient with a history of hysterectomy. Per patient's report bleeding has been present for at least 2-3 weeks, suspect may be longer. We discussed differential diagnosis today including infection, vaginal vault abrasion/laceration versus different types of cancers. We discussed needing to move forward with gynecological referral ASAP.  Patient is agreeable to this approach. - CBC w/Diff - Ambulatory referral to Gynecology>urgent - Cervicovaginal ancillary only(  [place])> + BRB on swab collection> results pending - POCT Occult Blood Stool> negative  Reviewed expectations re: course of current medical issues. Discussed self-management of symptoms. Outlined signs and symptoms indicating need for more acute intervention. Patient verbalized understanding and all questions were answered. Patient received an After-Visit Summary.    Orders Placed This Encounter  Procedures   CBC w/Diff   Ambulatory referral to Gynecology   POCT Occult Blood Stool   No orders of the defined types were placed in this encounter.  Referral Orders         Ambulatory referral to Gynecology       Note is dictated utilizing voice recognition software. Although note has  been proof read prior to signing, occasional typographical errors still can be missed. If any questions arise, please do not hesitate to call for verification.   electronically signed by:  Charlies Bellini, DO  [facility] [place]

## 2023-09-30 ENCOUNTER — Ambulatory Visit: Payer: Self-pay | Admitting: Family Medicine

## 2023-10-01 LAB — CERVICOVAGINAL ANCILLARY ONLY
Bacterial Vaginitis (gardnerella): NEGATIVE
Candida Glabrata: NEGATIVE
Candida Vaginitis: NEGATIVE
Chlamydia: NEGATIVE
Comment: NEGATIVE
Comment: NEGATIVE
Comment: NEGATIVE
Comment: NEGATIVE
Comment: NORMAL
Neisseria Gonorrhea: NEGATIVE

## 2023-10-22 ENCOUNTER — Telehealth: Payer: Self-pay | Admitting: *Deleted

## 2023-10-22 NOTE — Telephone Encounter (Signed)
 Left message need to change her 9:30 appointment on 10-23-2023 to 12:30.

## 2023-10-23 ENCOUNTER — Ambulatory Visit (INDEPENDENT_AMBULATORY_CARE_PROVIDER_SITE_OTHER): Admitting: *Deleted

## 2023-10-23 VITALS — Ht 63.5 in | Wt 126.0 lb

## 2023-10-23 DIAGNOSIS — Z0000 Encounter for general adult medical examination without abnormal findings: Secondary | ICD-10-CM

## 2023-10-23 NOTE — Patient Instructions (Signed)
 Ms. Denise Fuller , Thank you for taking time to come for your Medicare Wellness Visit. I appreciate your ongoing commitment to your health goals. Please review the following plan we discussed and let me know if I can assist you in the future.   Screening recommendations/referrals: Colonoscopy: no longer required Mammogram: no longer required  Bone Density: scheduled Recommended yearly ophthalmology/optometry visit for glaucoma screening and checkup Recommended yearly dental visit for hygiene and checkup  Vaccinations: Influenza vaccine: up to date Pneumococcal vaccine: up to date Tdap vaccine: up to date Shingles vaccine: up to date     Preventive Care 86 Years and Older, Female Preventive care refers to lifestyle choices and visits with your health care provider that can promote health and wellness. What does preventive care include? A yearly physical exam. This is also called an annual well check. Dental exams once or twice a year. Routine eye exams. Ask your health care provider how often you should have your eyes checked. Personal lifestyle choices, including: Daily care of your teeth and gums. Regular physical activity. Eating a healthy diet. Avoiding tobacco and drug use. Limiting alcohol use. Practicing safe sex. Taking low-dose aspirin every day. Taking vitamin and mineral supplements as recommended by your health care provider. What happens during an annual well check? The services and screenings done by your health care provider during your annual well check will depend on your age, overall health, lifestyle risk factors, and family history of disease. Counseling  Your health care provider may ask you questions about your: Alcohol use. Tobacco use. Drug use. Emotional well-being. Home and relationship well-being. Sexual activity. Eating habits. History of falls. Memory and ability to understand (cognition). Work and work Astronomer. Reproductive health. Screening   You may have the following tests or measurements: Height, weight, and BMI. Blood pressure. Lipid and cholesterol levels. These may be checked every 5 years, or more frequently if you are over 32 years old. Skin check. Lung cancer screening. You may have this screening every year starting at age 86 if you have a 30-pack-year history of smoking and currently smoke or have quit within the past 15 years. Fecal occult blood test (FOBT) of the stool. You may have this test every year starting at age 86. Flexible sigmoidoscopy or colonoscopy. You may have a sigmoidoscopy every 5 years or a colonoscopy every 10 years starting at age 86. Hepatitis C blood test. Hepatitis B blood test. Sexually transmitted disease (STD) testing. Diabetes screening. This is done by checking your blood sugar (glucose) after you have not eaten for a while (fasting). You may have this done every 1-3 years. Bone density scan. This is done to screen for osteoporosis. You may have this done starting at age 86. Mammogram. This may be done every 1-2 years. Talk to your health care provider about how often you should have regular mammograms. Talk with your health care provider about your test results, treatment options, and if necessary, the need for more tests. Vaccines  Your health care provider may recommend certain vaccines, such as: Influenza vaccine. This is recommended every year. Tetanus, diphtheria, and acellular pertussis (Tdap, Td) vaccine. You may need a Td booster every 10 years. Zoster vaccine. You may need this after age 86. Pneumococcal 13-valent conjugate (PCV13) vaccine. One dose is recommended after age 86. Pneumococcal polysaccharide (PPSV23) vaccine. One dose is recommended after age 86. Talk to your health care provider about which screenings and vaccines you need and how often you need them. This information is  not intended to replace advice given to you by your health care provider. Make sure you discuss  any questions you have with your health care provider. Document Released: 02/18/2015 Document Revised: 10/12/2015 Document Reviewed: 11/23/2014 Elsevier Interactive Patient Education  2017 [employer].  Fall Prevention in the Home Falls can cause injuries. They can happen to people of all ages. There are many things you can do to make your home safe and to help prevent falls. What can I do on the outside of my home? Regularly fix the edges of walkways and driveways and fix any cracks. Remove anything that might make you trip as you walk through a door, such as a raised step or threshold. Trim any bushes or trees on the path to your home. Use bright outdoor lighting. Clear any walking paths of anything that might make someone trip, such as rocks or tools. Regularly check to see if handrails are loose or broken. Make sure that both sides of any steps have handrails. Any raised decks and porches should have guardrails on the edges. Have any leaves, snow, or ice cleared regularly. Use sand or salt on walking paths during winter. Clean up any spills in your garage right away. This includes oil or grease spills. What can I do in the bathroom? Use night lights. Install grab bars by the toilet and in the tub and shower. Do not use towel bars as grab bars. Use non-skid mats or decals in the tub or shower. If you need to sit down in the shower, use a plastic, non-slip stool. Keep the floor dry. Clean up any water that spills on the floor as soon as it happens. Remove soap buildup in the tub or shower regularly. Attach bath mats securely with double-sided non-slip rug tape. Do not have throw rugs and other things on the floor that can make you trip. What can I do in the bedroom? Use night lights. Make sure that you have a light by your bed that is easy to reach. Do not use any sheets or blankets that are too big for your bed. They should not hang down onto the floor. Have a firm chair that has  side arms. You can use this for support while you get dressed. Do not have throw rugs and other things on the floor that can make you trip. What can I do in the kitchen? Clean up any spills right away. Avoid walking on wet floors. Keep items that you use a lot in easy-to-reach places. If you need to reach something above you, use a strong step stool that has a grab bar. Keep electrical cords out of the way. Do not use floor polish or wax that makes floors slippery. If you must use wax, use non-skid floor wax. Do not have throw rugs and other things on the floor that can make you trip. What can I do with my stairs? Do not leave any items on the stairs. Make sure that there are handrails on both sides of the stairs and use them. Fix handrails that are broken or loose. Make sure that handrails are as long as the stairways. Check any carpeting to make sure that it is firmly attached to the stairs. Fix any carpet that is loose or worn. Avoid having throw rugs at the top or bottom of the stairs. If you do have throw rugs, attach them to the floor with carpet tape. Make sure that you have a light switch at the top of the  stairs and the bottom of the stairs. If you do not have them, ask someone to add them for you. What else can I do to help prevent falls? Wear shoes that: Do not have high heels. Have rubber bottoms. Are comfortable and fit you well. Are closed at the toe. Do not wear sandals. If you use a stepladder: Make sure that it is fully opened. Do not climb a closed stepladder. Make sure that both sides of the stepladder are locked into place. Ask someone to hold it for you, if possible. Clearly mark and make sure that you can see: Any grab bars or handrails. First and last steps. Where the edge of each step is. Use tools that help you move around (mobility aids) if they are needed. These include: Canes. Walkers. Scooters. Crutches. Turn on the lights when you go into a dark area.  Replace any light bulbs as soon as they burn out. Set up your furniture so you have a clear path. Avoid moving your furniture around. If any of your floors are uneven, fix them. If there are any pets around you, be aware of where they are. Review your medicines with your doctor. Some medicines can make you feel dizzy. This can increase your chance of falling. Ask your doctor what other things that you can do to help prevent falls. This information is not intended to replace advice given to you by your health care provider. Make sure you discuss any questions you have with your health care provider. Document Released: 11/18/2008 Document Revised: 06/30/2015 Document Reviewed: 02/26/2014 Elsevier Interactive Patient Education  2017 [employer].

## 2023-10-23 NOTE — Progress Notes (Signed)
 Subjective:   Denise Fuller is a 88 y.o. female who presents for Medicare Annual (Subsequent) preventive examination.  Visit Complete: Virtual I connected with  Denise Fuller on 10/23/23 by a audio enabled telemedicine application and verified that I am speaking with the correct person using two identifiers.  Patient Location: Home  Provider Location: Home Office  I discussed the limitations of evaluation and management by telemedicine. The patient expressed understanding and agreed to proceed.  Vital Signs: Because this visit was a virtual/telehealth visit, some criteria may be missing or patient reported. Any vitals not documented were not able to be obtained and vitals that have been documented are patient reported.   Cardiac Risk Factors include: advanced age (>65men, >20 women)     Objective:    Today's Vitals   10/23/23 1241  Weight: 126 lb (57.2 kg)  Height: 5' 3.5 (1.613 m)   Body mass index is 21.97 kg/m.     10/23/2023   12:39 PM 04/02/2022    1:23 PM 03/29/2021    9:46 AM 03/23/2020    8:58 AM 08/28/2019    2:08 PM 04/26/2017    8:33 AM  Advanced Directives  Does Patient Have a Medical Advance Directive? Yes Yes Yes Yes Yes Yes   Type of Estate agent of [employer] Power of [place];Living will Living will Healthcare Power of [place];Living will Healthcare Power of [place];Living will Healthcare Power of [place];Living will  Does patient want to make changes to medical advance directive?  No - Patient declined      Copy of Healthcare Power of Attorney in Chart? No - copy requested   No - copy requested  No - copy requested      Data saved with a previous flowsheet row definition    Current Medications (verified) Outpatient Encounter Medications as of 10/23/2023  Medication Sig   aspirin EC 81 MG tablet Take 81 mg by mouth daily.   Cholecalciferol (VITAMIN D3) 1000 units CAPS Take 1 capsule by mouth daily. 2,000 units daily    Cyanocobalamin  (B-12 COMPLIANCE INJECTION) 1000 MCG/ML KIT Inject 1,000 mcg as directed every 30 (thirty) days. 08/28/2019 Weekly   donepezil  (ARICEPT ) 10 MG tablet Take 1 tablet (10 mg total) by mouth at bedtime.   estradiol  (ESTRACE ) 0.1 MG/GM vaginal cream Apply to vaginal area using a pea size amount on finger tip 3x weekly   Flaxseed, Linseed, (FLAX SEED OIL PO) Take 1,300 mg by mouth 2 (two) times daily.   levothyroxine  (SYNTHROID ) 88 MCG tablet Take 1 tablet (88 mcg total) by mouth daily before breakfast.   lisinopril  (ZESTRIL ) 20 MG tablet Take 1 tablet (20 mg total) by mouth daily.   mirabegron  ER (MYRBETRIQ ) 25 MG TB24 tablet Take 1 tablet (25 mg total) by mouth daily.   Multiple Vitamins-Minerals (MULTIPLE VITAMINS/WOMENS PO) Take 1 tablet by mouth daily. Unknown strength   Multiple Vitamins-Minerals (PRESERVISION AREDS PO) Take by mouth.   Polyethyl Glycol-Propyl Glycol (SYSTANE OP) Apply 1 drop to eye as needed (Eye dryness).   sulfamethoxazole -trimethoprim  (BACTRIM ) 400-80 MG tablet Take 1 tablet by mouth at bedtime.   tiZANidine  (ZANAFLEX ) 4 MG tablet Take 0.5 tablets (2 mg total) by mouth at bedtime.   No facility-administered encounter medications on file as of 10/23/2023.    Allergies (verified) Doxycycline, Keflex [cephalexin], and Iron   History: Past Medical History:  Diagnosis Date   Abnormal SPEP 07/16/2019   B12 deficiency 07/16/2019   Bartonella infection  BCC (basal cell carcinoma of skin) 1997   nose   Chickenpox    Chronic bilateral low back pain without sciatica 06/23/2019   Chronic venous insufficiency    Diverticulosis    Dyspnea on exertion 07/10/2019   Elevated alkaline phosphatase level    Elevated LDH    Elevated serum immunoglobulin free light chains 07/21/2019   Environmental allergies    GAD (generalized anxiety disorder)    Goals of care, counseling/discussion 08/28/2019   Groin abscess    culture negative, doxy resolved abscess   Hepatitis A     Hyperlipidemia    Hypertension    Hypothyroidism    Ingrown nail 04/10/2018   Lightheadedness 12/03/2018   Lymphadenopathy, cervical    chronic per pt. (left)   Macular degeneration    Meniere disease    MGUS (monoclonal gammopathy of unknown significance) 08/28/2019   Other fatigue 12/03/2018   Overweight (BMI 25.0-29.9) 04/10/2018   Palpitations 12/03/2018   Thyroid  disease    Weakness generalized 12/03/2018   Past Surgical History:  Procedure Laterality Date   ABDOMINAL HYSTERECTOMY  1991   APPENDECTOMY  1967   BASAL CELL CARCINOMA EXCISION  1997   nose   BLEPHAROPLASTY     CATARACT EXTRACTION W/ INTRAOCULAR LENS IMPLANT Bilateral Oct and Nov 2018   CHOLECYSTECTOMY  1967   ORIF WRIST FRACTURE Left 05/18/2020   Family History  Problem Relation Age of Onset   Arthritis Mother    Heart disease Mother    Arthritis Father    Aneurysm Father 21       brain   Alzheimer's disease Father    Thyroid  disease Daughter    Stroke Daughter        2020   Atrial fibrillation Son        2020   Social History   Socioeconomic History   Marital status: Married    Spouse name: Elgin   Number of children: 3   Years of education: Not on file   Highest education level: Not on file  Occupational History   Occupation: retired  Tobacco Use   Smoking status: Never   Smokeless tobacco: Never  Vaping Use   Vaping status: Never Used  Substance and Sexual Activity   Alcohol use: No   Drug use: No   Sexual activity: Yes    Partners: Male    Comment: married  Other Topics Concern   Not on file  Social History Narrative   Retired. Married to Millard. They have 3 children Lawrance Mickey Arnulfo [name])   Moved from [place]  05/2014.    Has 2 older dogs.    Retired.    Drinks caffeine, takes herbal remedies and dialy vitamin.   Wears her seatbelt, smoke detector in the home   Wears dentures, no assistive devices for walking - independent.    Feels safe in her relationships.    Social  Drivers of Corporate investment banker Strain: Low Risk  (10/23/2023)   Overall Financial Resource Strain (CARDIA)    Difficulty of Paying Living Expenses: Not hard at all  Food Insecurity: No Food Insecurity (10/23/2023)   Hunger Vital Sign    Worried About Running Out of Food in the Last Year: Never true    Ran Out of Food in the Last Year: Never true  Transportation Needs: No Transportation Needs (10/23/2023)   PRAPARE - Administrator, Civil Service (Medical): No    Lack of Transportation (Non-Medical): No  Physical  Activity: Sufficiently Active (10/23/2023)   Exercise Vital Sign    Days of Exercise per Week: 5 days    Minutes of Exercise per Session: 30 min  Stress: No Stress Concern Present (10/23/2023)   [employer] of Occupational Health - Occupational Stress Questionnaire    Feeling of Stress: Not at all  Social Connections: Moderately Isolated (10/23/2023)   Social Connection and Isolation Panel    Frequency of Communication with Friends and Family: More than three times a week    Frequency of Social Gatherings with Friends and Family: More than three times a week    Attends Religious Services: Never    Database administrator or Organizations: No    Attends Engineer, structural: Never    Marital Status: Married    Tobacco Counseling Counseling given: Not Answered   Clinical Intake:  Pre-visit preparation completed: Yes  Pain : No/denies pain     Diabetes: No  How often do you need to have someone help you when you read instructions, pamphlets, or other written materials from your doctor or pharmacy?: 1 - Never  Interpreter Needed?: No  Information entered by :: Mliss Graff LPN   Activities of Daily Living    10/23/2023   12:44 PM  In your present state of health, do you have any difficulty performing the following activities:  Hearing? 1  Vision? 0  Difficulty concentrating or making decisions? 0  Walking or climbing stairs? 0   Dressing or bathing? 0  Doing errands, shopping? 0  Preparing Food and eating ? N  Using the Toilet? N  In the past six months, have you accidently leaked urine? Y  Do you have problems with loss of bowel control? N  Managing your Medications? N  Managing your Finances? N  Housekeeping or managing your Housekeeping? N    Patient Care Team: Catherine Charlies LABOR, DO as PCP - General (Family Medicine) Elspeth Lauraine DEL, OD as Referring Physician Vannie Hoover (Dentistry) Shona Layman BROCKS, MD as Referring Physician (Urology)  Indicate any recent Medical Services you may have received from other than [facility] providers in the past year (date may be approximate).     Assessment:   This is a routine wellness examination for Denise Fuller.  Hearing/Vision screen Hearing Screening - Comments:: Some trouble no hearing aids Vision Screening - Comments:: Up to date [place]   Goals Addressed             This Visit's Progress    Patient Stated   On track    Maintain current health by staying active.      Patient Stated   On track    None at this time      Patient Stated       Maintain current lifestyle       Depression Screen    10/23/2023   12:45 PM 06/12/2023    9:09 AM 04/02/2022    1:13 PM 03/29/2021    9:44 AM 02/10/2021    4:07 PM 03/23/2020    9:03 AM 10/15/2018    9:42 AM  PHQ 2/9 Scores  PHQ - 2 Score 3 0 0 0 0 0 0  PHQ- 9 Score 7 0         Fall Risk    10/23/2023   12:37 PM 06/12/2023    9:09 AM 04/02/2022    1:24 PM 03/29/2021    9:48 AM 02/10/2021    4:05 PM  Fall Risk   Falls  in the past year? 0 0 0 0 1  Number falls in past yr: 0  0 0 0  Injury with Fall? 0  0 0 0  Risk for fall due to :    Impaired vision;Impaired balance/gait Impaired vision  Risk for fall due to: Comment    at times balance   Follow up Falls evaluation completed;Education provided;Falls prevention discussed Falls evaluation completed Falls evaluation completed Falls prevention discussed  Falls evaluation  completed      Data saved with a previous flowsheet row definition    MEDICARE RISK AT HOME: Medicare Risk at Home Any stairs in or around the home?: Yes If so, are there any without handrails?: No Home free of loose throw rugs in walkways, pet beds, electrical cords, etc?: Yes Adequate lighting in your home to reduce risk of falls?: Yes Life alert?: No Use of a cane, walker or w/c?: No Grab bars in the bathroom?: Yes Shower chair or bench in shower?: Yes Elevated toilet seat or a handicapped toilet?: Yes  TIMED UP AND GO:  Was the test performed?  No    Cognitive Function:        10/23/2023   12:42 PM 04/02/2022    1:24 PM 03/29/2021    9:54 AM  6CIT Screen  What Year? 4 points 0 points 0 points  What month? 0 points 0 points 0 points  What time? 0 points 0 points 0 points  Count back from 20 0 points 0 points 0 points  Months in reverse 0 points 2 points 4 points  Repeat phrase 4 points 10 points 6 points  Total Score 8 points 12 points 10 points    Immunizations Immunization History  Administered Date(s) Administered   Fluad Quad(high Dose 65+) 10/15/2018, 11/02/2019, 10/31/2020, 10/12/2021   Fluad Trivalent(High Dose 65+) 12/20/2022   INFLUENZA, HIGH DOSE SEASONAL PF 01/10/2016, 11/08/2016, 11/27/2017   Influenza-Unspecified 10/27/2014, 11/08/2016, 05/17/2020   PFIZER(Purple Top)SARS-COV-2 Vaccination 04/20/2019, 05/25/2019, 12/03/2019   Pneumococcal Conjugate-13 10/27/2014   Pneumococcal Polysaccharide-23 05/17/2020   Pneumococcal-Unspecified 05/17/2020   Tdap 05/17/2020   Zoster Recombinant(Shingrix) 09/20/2017, 01/05/2018    TDAP status: Up to date  Flu Vaccine status: Up to date  Pneumococcal vaccine status: Up to date  Covid-19 vaccine status: Declined, Education has been provided regarding the importance of this vaccine but patient still declined. Advised may receive this vaccine at local pharmacy or Health Dept.or vaccine clinic. Aware to provide a  copy of the vaccination record if obtained from local pharmacy or Health Dept. Verbalized acceptance and understanding.  Qualifies for Shingles Vaccine? No   Zostavax completed Yes   Shingrix Completed?: Yes  Screening Tests Health Maintenance  Topic Date Due   Influenza Vaccine  05/05/2024 (Originally 09/06/2023)   Medicare Annual Wellness (AWV)  10/22/2024   DTaP/Tdap/Td (2 - Td or Tdap) 05/18/2030   Pneumococcal Vaccine: 50+ Years  Completed   DEXA SCAN  Completed   Zoster Vaccines- Shingrix  Completed   HPV VACCINES  Aged Out   Meningococcal B Vaccine  Aged Out   COVID-19 Vaccine  Discontinued    Health Maintenance  There are no preventive care reminders to display for this patient.   Colorectal cancer screening: No longer required.   Mammogram status: No longer required due to  .  Bone Density   scheduled  Lung Cancer Screening: (Low Dose CT Chest recommended if Age 14-80 years, 20 pack-year currently smoking OR have quit w/in 15years.) does not qualify.  Lung Cancer Screening Referral:   Additional Screening:  Hepatitis C Screening:    never done  Vision Screening: Recommended annual ophthalmology exams for early detection of glaucoma and other disorders of the eye. Is the patient up to date with their annual eye exam?  Yes  Who is the provider or what is the name of the office in which the patient attends annual eye exams? [facility] If pt is not established with a provider, would they like to be referred to a provider to establish care? No .   Dental Screening: Recommended annual dental exams for proper oral hygiene   Community Resource Referral / Chronic Care Management: CRR required this visit?  No   CCM required this visit?  No     Plan:     I have personally reviewed and noted the following in the patient's chart:   Medical and social history Use of alcohol, tobacco or illicit drugs  Current medications and supplements including opioid prescriptions.  Patient is not currently taking opioid prescriptions. Functional ability and status Nutritional status Physical activity Advanced directives List of other physicians Hospitalizations, surgeries, and ER visits in previous 12 months Vitals Screenings to include cognitive, depression, and falls Referrals and appointments  In addition, I have reviewed and discussed with patient certain preventive protocols, quality metrics, and best practice recommendations. A written personalized care plan for preventive services as well as general preventive health recommendations were provided to patient.     Mliss Graff, LPN   [id number]   After Visit Summary: (MyChart) Due to this being a telephonic visit, the after visit summary with patients personalized plan was offered to patient via MyChart   Nurse Notes:

## 2023-10-29 DIAGNOSIS — M810 Age-related osteoporosis without current pathological fracture: Secondary | ICD-10-CM | POA: Diagnosis not present

## 2023-10-29 DIAGNOSIS — M8008XD Age-related osteoporosis with current pathological fracture, vertebra(e), subsequent encounter for fracture with routine healing: Secondary | ICD-10-CM | POA: Diagnosis not present

## 2023-11-26 DIAGNOSIS — R399 Unspecified symptoms and signs involving the genitourinary system: Secondary | ICD-10-CM | POA: Diagnosis not present

## 2023-11-26 DIAGNOSIS — N3020 Other chronic cystitis without hematuria: Secondary | ICD-10-CM | POA: Diagnosis not present

## 2023-11-27 ENCOUNTER — Encounter: Payer: Self-pay | Admitting: Family Medicine

## 2023-11-27 ENCOUNTER — Ambulatory Visit: Admitting: Family Medicine

## 2023-11-27 VITALS — BP 124/80 | HR 80 | Temp 98.1°F | Wt 126.6 lb

## 2023-11-27 DIAGNOSIS — Z23 Encounter for immunization: Secondary | ICD-10-CM | POA: Diagnosis not present

## 2023-11-27 DIAGNOSIS — D472 Monoclonal gammopathy: Secondary | ICD-10-CM | POA: Diagnosis not present

## 2023-11-27 DIAGNOSIS — I10 Essential (primary) hypertension: Secondary | ICD-10-CM | POA: Diagnosis not present

## 2023-11-27 DIAGNOSIS — S22000A Wedge compression fracture of unspecified thoracic vertebra, initial encounter for closed fracture: Secondary | ICD-10-CM | POA: Diagnosis not present

## 2023-11-27 DIAGNOSIS — M51362 Other intervertebral disc degeneration, lumbar region with discogenic back pain and lower extremity pain: Secondary | ICD-10-CM | POA: Diagnosis not present

## 2023-11-27 DIAGNOSIS — M545 Low back pain, unspecified: Secondary | ICD-10-CM | POA: Diagnosis not present

## 2023-11-27 DIAGNOSIS — N3281 Overactive bladder: Secondary | ICD-10-CM

## 2023-11-27 DIAGNOSIS — E782 Mixed hyperlipidemia: Secondary | ICD-10-CM | POA: Diagnosis not present

## 2023-11-27 DIAGNOSIS — E039 Hypothyroidism, unspecified: Secondary | ICD-10-CM

## 2023-11-27 DIAGNOSIS — R4189 Other symptoms and signs involving cognitive functions and awareness: Secondary | ICD-10-CM | POA: Diagnosis not present

## 2023-11-27 DIAGNOSIS — M48062 Spinal stenosis, lumbar region with neurogenic claudication: Secondary | ICD-10-CM

## 2023-11-27 DIAGNOSIS — M48061 Spinal stenosis, lumbar region without neurogenic claudication: Secondary | ICD-10-CM | POA: Diagnosis not present

## 2023-11-27 DIAGNOSIS — R7689 Other specified abnormal immunological findings in serum: Secondary | ICD-10-CM

## 2023-11-27 DIAGNOSIS — N1831 Chronic kidney disease, stage 3a: Secondary | ICD-10-CM

## 2023-11-27 DIAGNOSIS — M4186 Other forms of scoliosis, lumbar region: Secondary | ICD-10-CM

## 2023-11-27 LAB — COMPREHENSIVE METABOLIC PANEL WITH GFR
ALT: 15 U/L (ref 0–35)
AST: 19 U/L (ref 0–37)
Albumin: 4 g/dL (ref 3.5–5.2)
Alkaline Phosphatase: 107 U/L (ref 39–117)
BUN: 12 mg/dL (ref 6–23)
CO2: 31 meq/L (ref 19–32)
Calcium: 9 mg/dL (ref 8.4–10.5)
Chloride: 103 meq/L (ref 96–112)
Creatinine, Ser: 0.72 mg/dL (ref 0.40–1.20)
GFR: 75.95 mL/min (ref >60.00)
Glucose, Bld: 98 mg/dL (ref 70–99)
Potassium: 3.6 meq/L (ref 3.5–5.1)
Sodium: 141 meq/L (ref 135–145)
Total Bilirubin: 0.5 mg/dL (ref 0.2–1.2)
Total Protein: 6.3 g/dL (ref 6.0–8.3)

## 2023-11-27 LAB — CBC WITH DIFFERENTIAL/PLATELET
Basophils Absolute: 0 K/uL (ref 0.0–0.1)
Basophils Relative: 0.8 % (ref 0.0–3.0)
Eosinophils Absolute: 0.2 K/uL (ref 0.0–0.7)
Eosinophils Relative: 3.3 % (ref 0.0–5.0)
HCT: 41.2 % (ref 36.0–46.0)
Hemoglobin: 13.4 g/dL (ref 12.0–15.0)
Lymphocytes Relative: 32 % (ref 12.0–46.0)
Lymphs Abs: 2 K/uL (ref 0.7–4.0)
MCHC: 32.5 g/dL (ref 30.0–36.0)
MCV: 91.6 fl (ref 78.0–100.0)
Monocytes Absolute: 0.6 K/uL (ref 0.1–1.0)
Monocytes Relative: 9.5 % (ref 3.0–12.0)
Neutro Abs: 3.4 K/uL (ref 1.4–7.7)
Neutrophils Relative %: 54.4 % (ref 43.0–77.0)
Platelets: 260 K/uL (ref 150.0–400.0)
RBC: 4.5 Mil/uL (ref 3.87–5.11)
RDW: 14 % (ref 11.5–15.5)
WBC: 6.3 K/uL (ref 4.0–10.5)

## 2023-11-27 LAB — TSH: TSH: 8.61 u[IU]/mL — ABNORMAL HIGH (ref 0.35–5.50)

## 2023-11-27 MED ORDER — LISINOPRIL 20 MG PO TABS: 20.0000 mg | ORAL_TABLET | Freq: Every day | ORAL | 1 refills | Status: AC

## 2023-11-27 MED ORDER — MIRABEGRON ER 25 MG PO TB24: 25.0000 mg | ORAL_TABLET | Freq: Every day | ORAL | 11 refills | Status: DC

## 2023-11-27 NOTE — Progress Notes (Signed)
 Denise Fuller , 1935-03-22, 88 y.o., female MRN: [id number] Patient Care Team    Relationship Specialty Notifications Start End  Catherine Charlies LABOR, DO PCP - General Family Medicine  01/10/16   Elspeth Lauraine DEL, OD Referring Physician   01/10/16    Comment: ophth- macular degeneration  Denise Fuller  Dentistry  04/26/17   Elisabeth Valli JONETTA, MD Consulting Physician Urology  11/27/23   Denise Donnajean Kayser, MD Referring Physician Orthopedic Surgery  11/27/23     Chief Complaint  Patient presents with   Hypertension   Hyperlipidemia     Subjective: Denise Fuller is a 88 y.o. female present for Chronic Conditions/illness Management All past medical history, surgical history, allergies, family history, immunizations and social history was obtained from the patient today and entered into the electronic medical record.   Essential hypertension/HLD Pt reports compliance with lisinopril  20  mg a day.  Patient denies chest pain, shortness of breath, dizziness or lower extremity edema.   Pt takesdaily baby ASA.   Hypothyroid: Patient compliant with levothyroxine  88 mcg daily on an empty stomach.  MGUS: Had followed with hematology, had been stable.  Told to follow-up with PCP with labs every 6 months and return to oncology if rising M spike. Cognitive decline/b12 def: She has received B12 injections as indicated and has noticed positive improvement with injections.. Prior note: Pt presents for an OV with her daughter today to discuss cognitive changes that have been noticed over the last year. Pt reports she has noticed she is more forgetful of appts and details of conversations. Her daughter reports the family noticed changes a little of a year ago. Started with forgetting having conversations. Daughter reports last couple of months there has been changes in her cooking, forgetting appts, even forgetting she wrote down appts, more details of conversations. When she is told she forgot she  has started to become more elevated. She has h/o b12 def and thyroid  disorder. She reports compliance with levothyroxine  and monthly b12 inj.  She has had a h/o of abnormal spep, which has been stable. No h/o  stroke, but endorses today her eye doctor told her she was having mini-strokes of her eye. She takes a baby ASA and BP is well controlled on medication.   Overactive bladder (Primary)/hematuria/persistent protease infection Patient denies any dysuria.  She has chronic urinary frequency.  She is now established with Dr. Elisabeth.  Lumbar back pain Patient reports the use of the diclofenac  and half a tab of Zanaflex  nightly has helped her lower back.  X-ray of the lower back resulted with mild levoscoliosis, lower thoracic compression fracture, and degenerative changes of lumbar spine Patient reports she still has some lower back discomfort but has improved.   She has a significant medical history of MGUS. She is established with spine and scoliosis which performed a bone density test.  She has follow-up scheduled with them.  Prior note: Patient points to her lower lumbar area as location of discomfort on both sides of her spine.  Pain has been present for approximately 6 weeks . she states the discomfort is worse if she sits for a while or when standing long periods of time.  She endorses having a tingling sensation on the right lateral aspect of her calf.  She denies any recent injury or overactivity.  She has a history of compression fracture T12 and mild to moderate degenerative changes of the lumbar spine with degenerative levoscoliosis mid lumbar spine according to  x-ray 06/2019.  She has had no spinal surgery in the past Patient has a significant history of MGUS, due for her every 49-month follow-up in a couple 4 weeks for labs.  Memory change Patient reports Aricept  caused her to be nausea, vomiting and had diarrhea therefore she discontinued. She does not want to pursue further  medication options at this time. Prior note Patient reports she has noticed her memory declining over a few years, but her daughter noticed recently that she has had a rapid decline in her memory and brought it to her attention.  She is taking vitamin D , B12 and just start Prevagen supplement. MRI completed/resulted with chronic small vessel ischemic changes within the cerebral white matter and pons of moderate severity.  Otherwise normal.  Lumbar x-ray 05/22/2023: FINDINGS: Five lumbar type vertebral bodies are well visualized. Vertebral body height is well maintained with the exception of T12 which again shows a compression deformity stable from the prior exam. No anterolisthesis is noted. Disc space narrowing is noted from L4-S1. No soft tissue changes are seen. No pedicle abnormality is noted. Mild degenerative scoliosis is noted concave to the right. IMPRESSION: Stable appearance of the lumbar spine when compared with the prior Study  MRI of the brain was completed 05/22/2023 IMPRESSION: 1. No evidence of an acute intracranial abnormality. 2. No age-advanced or lobar predominant cerebral atrophy. 3. Chronic small vessel ischemic changes within the cerebral white matter and pons, overall moderate in severity.  Renal US  01/2023 FINDINGS: Right Kidney: Renal measurements: 9.5 x 3.9 x 4.3 cm = volume: 82.2 mL. Echogenicity within normal limits. No mass or hydronephrosis visualized. Left Kidney: Renal measurements: 10.2 x 4.8 x 4.1 cm = volume: 100.8 mL. Echogenicity within normal limits. No mass or hydronephrosis visualized. Bladder: Appears normal for degree of bladder distention. IMPRESSION: No hydronephrosis.     06/2019- lumbar xray FINDINGS: There is a degenerative levoscoliosis centered in the mid lumbar spine. Mild-to-moderate degenerative changes are noted throughout the lumbar spine, greatest in the lower lumbar segments were there is moderate to severe disc height  loss and mild to moderate facet arthrosis. There is an age-indeterminate compression fracture of the T12 vertebral body with approximately 40% height loss anteriorly.   IMPRESSION: 1. Age-indeterminate compression fracture of the T12 vertebral body. 2. Mild to moderate multilevel degenerative changes of the lumbar spine. 3. Degenerative levoscoliosis centered in the mid lumbar spine.  DEXA 10/2023:  FINDINGS:      LUMBAR SPINE:  The bone mineral density of the lumbar spine from L1 through L4 is 0.848 g/cm2.  T-score: -2.8  Z-score: -0.6  Additional comments/Omitted levels: None.  Comparison: No comparison.   PROXIMAL FEMUR:  The bone mineral density as measured at the right femoral neck region of interest is 0.580 g/cm2.  T-score: -3.3  Z-score: -1.0  Comparison: No comparison.  Additional sites: None  IMPRESSION:   Osteoporosis.      11/27/2023    9:09 AM 10/23/2023   12:45 PM 06/12/2023    9:09 AM 04/02/2022    1:13 PM 03/29/2021    9:44 AM  Depression screen PHQ 2/9  Decreased Interest 0 0 0 0 0  Down, Depressed, Hopeless 0 3 0 0 0  PHQ - 2 Score 0 3 0 0 0  Altered sleeping 0 2 0    Tired, decreased energy 0 2 0    Change in appetite 0 0 0    Feeling bad or failure about yourself  0 0 0  Trouble concentrating 0 0 0    Moving slowly or fidgety/restless 0 0 0    Suicidal thoughts 0 0 0    PHQ-9 Score 0 7 0    Difficult doing work/chores Not difficult at all Not difficult at all Not difficult at all      Allergies  Allergen Reactions   Doxycycline     GI upset   Keflex [Cephalexin] Hives and Itching   Iron Rash   Social History   Social History Narrative   Retired. Married to [place]. They have 3 children Lawrance Mickey Arnulfo [name])   Moved from [place]  05/2014.    Has 2 older dogs.    Retired.    Drinks caffeine, takes herbal remedies and dialy vitamin.   Wears her seatbelt, smoke detector in the home   Wears dentures, no assistive devices for walking -  independent.    Feels safe in her relationships.    Past Medical History:  Diagnosis Date   Abnormal SPEP 07/16/2019   B12 deficiency 07/16/2019   Bartonella infection    BCC (basal cell carcinoma of skin) 1997   nose   Chickenpox    Chronic bilateral low back pain without sciatica 06/23/2019   Chronic venous insufficiency    Diverticulosis    Dyspnea on exertion 07/10/2019   Elevated alkaline phosphatase level    Elevated LDH    Elevated serum immunoglobulin free light chains 07/21/2019   Environmental allergies    GAD (generalized anxiety disorder)    Goals of care, counseling/discussion 08/28/2019   Groin abscess    culture negative, doxy resolved abscess   Hepatitis A    Hyperlipidemia    Hypertension    Hypothyroidism    Ingrown nail 04/10/2018   Lightheadedness 12/03/2018   Lymphadenopathy, cervical    chronic per pt. (left)   Macular degeneration    Meniere disease    MGUS (monoclonal gammopathy of unknown significance) 08/28/2019   Other fatigue 12/03/2018   Overweight (BMI 25.0-29.9) 04/10/2018   Palpitations 12/03/2018   Thyroid  disease    Weakness generalized 12/03/2018   Past Surgical History:  Procedure Laterality Date   ABDOMINAL HYSTERECTOMY  1991   APPENDECTOMY  1967   BASAL CELL CARCINOMA EXCISION  1997   nose   BLEPHAROPLASTY     CATARACT EXTRACTION W/ INTRAOCULAR LENS IMPLANT Bilateral Oct and Nov 2018   CHOLECYSTECTOMY  1967   ORIF WRIST FRACTURE Left 05/18/2020   Family History  Problem Relation Age of Onset   Arthritis Mother    Heart disease Mother    Arthritis Father    Aneurysm Father 26       brain   Alzheimer's disease Father    Thyroid  disease Daughter    Stroke Daughter        2020   Atrial fibrillation Son        2020    All past medical history, surgical history, allergies, family history, immunizations andmedications were updated in the EMR today and reviewed under the history and medication portions of their EMR.     ROS:  Negative, with the exception of above mentioned in HPI   Objective:  BP 127/79   Pulse 80   Temp 98.1 F (36.7 C)   Wt 126 lb 9.6 oz (57.4 kg)   SpO2 97%   BMI 22.07 kg/m  Body mass index is 22.07 kg/m. Physical Exam Vitals and nursing note reviewed.  Constitutional:      General: She is not  in acute distress.    Appearance: Normal appearance. She is not ill-appearing, toxic-appearing or diaphoretic.  HENT:     Head: Normocephalic and atraumatic.     Mouth/Throat:     Mouth: Mucous membranes are moist.  Eyes:     General: No scleral icterus.       Right eye: No discharge.        Left eye: No discharge.     Extraocular Movements: Extraocular movements intact.     Conjunctiva/sclera: Conjunctivae normal.     Pupils: Pupils are equal, round, and reactive to light.  Cardiovascular:     Rate and Rhythm: Normal rate and regular rhythm.     Heart sounds: No murmur heard. Pulmonary:     Effort: Pulmonary effort is normal. No respiratory distress.     Breath sounds: Normal breath sounds. No wheezing, rhonchi or rales.  Musculoskeletal:     Cervical back: Neck supple. No tenderness.     Right lower leg: No edema.     Left lower leg: No edema.  Lymphadenopathy:     Cervical: No cervical adenopathy.  Skin:    General: Skin is warm and dry.     Coloration: Skin is not jaundiced or pale.     Findings: No erythema or rash.  Neurological:     Mental Status: She is alert and oriented to person, place, and time. Mental status is at baseline.     Motor: No weakness.     Gait: Gait normal.  Psychiatric:        Mood and Affect: Mood normal.        Behavior: Behavior normal.        Thought Content: Thought content normal.        Judgment: Judgment normal.    No results found. No results found. No results found for this or any previous visit (from the past 24 hours).   Assessment/Plan: DENISE WASHBURN is a 88 y.o. female present for OV condition management HTN/Mixed  hyperlipidemia Stable Dc dyazide and use only if swelling is appreciated.  Continue lisinopril  20 mg a day.    Hypothyroidism due to acquired atrophy of thyroid  Continue levothyroxine  88 mcg daily. refills will be provided in appropriate dose based on lab result today TSH collected today   B12 deficiency Continue B12 injections monthly, indefinitely according to schedule.   MGUS (monoclonal gammopathy of unknown significance)/Elevated serum immunoglobulin free light chains/Stage 3a chronic kidney disease (HCC) -Routine SPEP, BMP every 6 months.  If  M spike recurs then will refer to oncology at that time (per onc). - CBC, spep and CMP collected today  Overactive bladder (Primary)/hematuria/persistent protease infection Established with Dr. Elisabeth Continue Myrbetriq   Lumbar back pain/thoracic compression fracture/lumbar degenerative disc disease/levoscoliosis lumbar spine Some improvement with medications, however still present with radiculopathy. Continue diclofenac  75 mg daily in the evening with food  Continue Zanaflex  2 mg nightly as needed Patient established with spine scoliosis clinic  Memory change There has been noted decline in patient's cognitive status over the last 2 years.  Her daughter has recently noticed more cognitive decline.  Labs unrevealing collected in April: TSH, vitamin D , B12, folate, Lyme titers and MRI brain: Overall reassuring.  However patient does have moderate small ischemic vessel disease per MRI DC'd Aricept  secondary to GI side effects.  Patient does not wish to pursue other medication options at this time.   Reviewed expectations re: course of current medical issues. Discussed self-management of symptoms. Outlined signs  and symptoms indicating need for more acute intervention. Patient verbalized understanding and all questions were answered. Patient received an After-Visit Summary.   Orders Placed This Encounter  Procedures   CBC w/Diff    Comp Met (CMET)   Protein,Total and Electrophor w/IFE   TSH   No orders of the defined types were placed in this encounter.   Referral Orders  No referral(s) requested today     Note is dictated utilizing voice recognition software. Although note has been proof read prior to signing, occasional typographical errors still can be missed. If any questions arise, please do not hesitate to call for verification.   electronically signed by:  Charlies Bellini, DO  [facility] [place]

## 2023-11-27 NOTE — Patient Instructions (Addendum)
 Return in about 24 weeks (around 05/13/2024) for cpe (20 min), Routine chronic condition follow-up.      Appointment Scheduling:   We recognize that you are likely looking forward to scheduling your appointment.  Your referral has been sent to the appropriate office for scheduling. Usually, it takes 3-5 business days to process referrals once they are received.  Please allow this time for the office handling your referral to contact you directly to arrange your appointment.  If you have not been contacted within 5 business days, please call [phone] to make an appointment.      Information for Referral #: [id number]   Diagnoses:   N95.0 (ICD-10-CM) - Abnormal vaginal bleeding in postmenopausal patient   Procedures: REF30 (Custom) - AMB REFERRAL TO GYNECOLOGY Authorization #:     Referring Provider Information Charlies DELENA Bellini [address] [place] [address]  [phone] Referring To Provider Information [facility] FEMINA [address] [place] [address] [phone]  Referral Start Date: 09/27/2023 Referral End Date: 09/26/2024        [place] to see you today.  I have refilled the medication(s) we provide.   If labs were collected or images ordered, we will inform you of  results once we have received them and reviewed. We will contact you either by echart message, or telephone call.  Please give ample time to the testing facility, and our office to run,  receive and review results. Please do not call inquiring of results, even if you can see them in your chart. We will contact you as soon as we are able. If it has been over 1 week since the test was completed, and you have not yet heard from us , then please call us .    - echart message- for normal results that have been seen by the patient already.   - telephone call: abnormal results or if patient has not viewed results in their echart.  If a referral to a specialist was entered for you, please  call us  in 2 weeks if you have not heard from the specialist office to schedule.

## 2023-11-28 ENCOUNTER — Ambulatory Visit: Payer: Self-pay | Admitting: Family Medicine

## 2023-11-28 DIAGNOSIS — E039 Hypothyroidism, unspecified: Secondary | ICD-10-CM

## 2023-11-28 MED ORDER — LEVOTHYROXINE SODIUM 100 MCG PO TABS: 100.0000 ug | ORAL_TABLET | Freq: Every day | ORAL | 1 refills | Status: AC

## 2023-12-04 LAB — PROTEIN,TOTAL AND ELECTROPHOR W/IFE
Albumin ELP: 3.9 g/dL (ref 3.8–4.8)
Alpha 1: 0.3 g/dL (ref 0.2–0.3)
Alpha 2: 0.7 g/dL (ref 0.5–0.9)
Beta 2: 0.3 g/dL (ref 0.2–0.5)
Beta Globulin: 0.4 g/dL (ref 0.4–0.6)
Gamma Globulin: 0.9 g/dL (ref 0.8–1.7)
Total Protein: 6.4 g/dL (ref 6.1–8.1)

## 2024-01-08 ENCOUNTER — Encounter: Admitting: Obstetrics and Gynecology

## 2024-01-09 ENCOUNTER — Ambulatory Visit

## 2024-01-09 ENCOUNTER — Other Ambulatory Visit

## 2024-01-09 DIAGNOSIS — E538 Deficiency of other specified B group vitamins: Secondary | ICD-10-CM | POA: Diagnosis not present

## 2024-01-09 DIAGNOSIS — E039 Hypothyroidism, unspecified: Secondary | ICD-10-CM

## 2024-01-09 LAB — TSH: TSH: 2.83 u[IU]/mL (ref 0.35–5.50)

## 2024-01-09 MED ORDER — CYANOCOBALAMIN 1000 MCG/ML IJ SOLN
1000.0000 ug | Freq: Once | INTRAMUSCULAR | Status: AC
  Administered 2024-01-09: 1000 ug via INTRAMUSCULAR

## 2024-01-09 NOTE — Progress Notes (Signed)
 Pt here for monthly B12 injection per Dr. Catherine  Injection given L arm IM, pt tolerated injection well.   Next injection scheduled for: 1 month.

## 2024-01-10 ENCOUNTER — Ambulatory Visit: Payer: Self-pay | Admitting: Family Medicine

## 2024-01-10 DIAGNOSIS — Z79899 Other long term (current) drug therapy: Secondary | ICD-10-CM | POA: Diagnosis not present

## 2024-01-10 DIAGNOSIS — Z1321 Encounter for screening for nutritional disorder: Secondary | ICD-10-CM | POA: Diagnosis not present

## 2024-01-10 DIAGNOSIS — Z780 Asymptomatic menopausal state: Secondary | ICD-10-CM | POA: Diagnosis not present

## 2024-01-10 DIAGNOSIS — M810 Age-related osteoporosis without current pathological fracture: Secondary | ICD-10-CM | POA: Diagnosis not present

## 2024-01-13 ENCOUNTER — Ambulatory Visit: Admitting: Obstetrics and Gynecology

## 2024-01-13 ENCOUNTER — Encounter: Payer: Self-pay | Admitting: Obstetrics and Gynecology

## 2024-01-13 ENCOUNTER — Telehealth: Payer: Self-pay

## 2024-01-13 VITALS — BP 157/102 | HR 78 | Ht 63.0 in | Wt 133.6 lb

## 2024-01-13 DIAGNOSIS — N898 Other specified noninflammatory disorders of vagina: Secondary | ICD-10-CM

## 2024-01-13 NOTE — Progress Notes (Signed)
 Pt presents for vaginal bleeding. Pt has no questions oe concerns at this time.

## 2024-01-13 NOTE — Progress Notes (Signed)
   NEW GYNECOLOGY VISIT  Subjective:  Denise Fuller is a 88 y.o. menopausal [phone] s/p hysterectomy presenting for follow up of vaginal bleeding  Initially presented to PCP 4 months ago with suspected rectal bleeding x 2-3 weeks. On exam, a swab of the vagina had bright red blood. Internal hemorrhoids were noted but FOBT was negative. She was referred here for further evaluation  Today, she reports persistent vaginal bleeding. She is concerned about urinary incontinence. Has OAB, is not currently taking mirabegron . Started leaking more after exam by PCP. History of hysterectomy for AUB.  I personally reviewed: - PCP note 09/27/23 - CBC 09/27/23 & 11/27/23 - Hgb 13.1 & 13.4  - Negative GC/CT/BV/candida 09/27/23  Objective:   Vitals:   01/13/24 1314 01/13/24 1324  BP: (!) 174/93 (!) 157/102  Pulse: 75 78  Weight: 133 lb 9.6 oz (60.6 kg)   Height: 5' 3 (1.6 m)    General:  Alert, oriented and cooperative. Patient is in no acute distress.  Skin: Skin is warm and dry. No rash noted.   Cardiovascular: Normal heart rate noted  Respiratory: Normal respiratory effort, no problems with respiration noted  Abdomen: Soft, non-tender, non-distended   Pelvic: Postmenopausal changes to vulva with loss of labial archtecture and narrowing of the introitus.  Fungating vaginal mass on right side of vagina extending from anterior portion (bladder) to posterior portion (rectum) with purulent drainage. Biopsy deferred given unclear bladder/rectal involvement or fistulization.  Further exam limited by pain  Exam performed in the presence of a chaperone  Assessment and Plan:  Denise Fuller is a 88 y.o. with vaginal mass concerning for malignancy  Vaginal mass Likely malignant with appearance and patient age. This was reviewed w/ patient and her daughter Denise Fuller Pt initially reports that she does not want cancer directed therapies, but is open to a work up to see if less invasive/morbid treatment options  are available Will obtain CT C/A/P to quickly assess for origin of mass and metastatic disease We reviewed she will likely need in office biopsies, pelvic MRI, and/or EUA/cysto/colonoscopy. Follow up pending CT results -     CT CHEST ABDOMEN PELVIS W CONTRAST; Future  Future Appointments  Date Time Provider Department Center  01/14/2024 12:00 PM [facility] [place]  02/07/2024 10:00 AM [facility] CLINICAL SUPPORT [facility] [address]  10/28/2024 10:10 AM [facility] ANNUAL WELLNESS VISIT [facility] [address]   Kieth JAYSON Carolin, MD

## 2024-01-13 NOTE — Telephone Encounter (Signed)
 Pt calling requesting information about whether her afternoon appointment will be canceled or not. She is scheduled to see Dr. Erik this afternoon and would like a call back.   Cyndee, CHARITY FUNDRAISER

## 2024-01-14 ENCOUNTER — Ambulatory Visit (HOSPITAL_COMMUNITY): Admission: RE | Admit: 2024-01-14 | Discharge: 2024-01-14 | Attending: Obstetrics and Gynecology

## 2024-01-14 DIAGNOSIS — N898 Other specified noninflammatory disorders of vagina: Secondary | ICD-10-CM

## 2024-01-14 MED ORDER — SODIUM CHLORIDE (PF) 0.9 % IJ SOLN
INTRAMUSCULAR | Status: AC
  Filled 2024-01-14: qty 50

## 2024-01-14 MED ORDER — IOHEXOL 300 MG/ML  SOLN
100.0000 mL | Freq: Once | INTRAMUSCULAR | Status: AC | PRN
  Administered 2024-01-14: 100 mL via INTRAVENOUS

## 2024-01-16 ENCOUNTER — Ambulatory Visit: Payer: Self-pay | Admitting: Obstetrics and Gynecology

## 2024-01-16 DIAGNOSIS — N898 Other specified noninflammatory disorders of vagina: Secondary | ICD-10-CM

## 2024-01-17 NOTE — Telephone Encounter (Signed)
 Attempted to call patient to review results, but LVM.  We had discussed keeping her daughter in the loop previously, so I called her daughter [name] to review results and next steps including biopsy 12/19, pelvic MRI and ultimately referral to oncology to review treatment options. No additional questions at this time.   Kieth Carolin, MD Obstetrician & Gynecologist, [facility] for [employer], [facility]

## 2024-01-23 ENCOUNTER — Ambulatory Visit (HOSPITAL_COMMUNITY): Admission: RE | Admit: 2024-01-23 | Discharge: 2024-01-23 | Attending: Obstetrics and Gynecology

## 2024-01-23 DIAGNOSIS — N898 Other specified noninflammatory disorders of vagina: Secondary | ICD-10-CM | POA: Insufficient documentation

## 2024-01-23 MED ORDER — GADOBUTROL 1 MMOL/ML IV SOLN
6.0000 mL | Freq: Once | INTRAVENOUS | Status: AC | PRN
  Administered 2024-01-23: 18:00:00 6 mL via INTRAVENOUS

## 2024-01-24 ENCOUNTER — Telehealth: Payer: Self-pay | Admitting: *Deleted

## 2024-01-24 ENCOUNTER — Other Ambulatory Visit (HOSPITAL_COMMUNITY)
Admission: RE | Admit: 2024-01-24 | Discharge: 2024-01-24 | Disposition: A | Discharge status: Home / Self Care | Source: Ambulatory Visit | Attending: Obstetrics and Gynecology | Admitting: Obstetrics and Gynecology

## 2024-01-24 ENCOUNTER — Other Ambulatory Visit: Payer: Self-pay | Admitting: Obstetrics and Gynecology

## 2024-01-24 ENCOUNTER — Encounter: Payer: Self-pay | Admitting: Obstetrics and Gynecology

## 2024-01-24 VITALS — BP 190/95 | HR 76 | Ht 63.0 in | Wt 121.0 lb

## 2024-01-24 DIAGNOSIS — N898 Other specified noninflammatory disorders of vagina: Secondary | ICD-10-CM | POA: Insufficient documentation

## 2024-01-24 NOTE — Progress Notes (Signed)
 88 y.o. GYN presents for ENDO Bx.

## 2024-01-24 NOTE — Telephone Encounter (Signed)
 Spoke with the patient regarding the referral to GYN oncology. Patient scheduled as new patient with Dr Viktoria on 12/26 at 10:30 am. Patient given an arrival time of 10 am.  Explained to the patient the the doctor will perform a pelvic exam at this visit. Patient given the policy that only one visitor allowed and that visitor must be over 16 yrs are allowed in the Cancer Center. Patient given the address/phone number for the clinic and that the center offers free valet service. Patient aware that masks optional.  Patient will call her daughter and call back to confirm appt

## 2024-01-24 NOTE — Progress Notes (Signed)
" ° ° °  GYNECOLOGY OFFICE BIOPSY PROCEDURE NOTE  88 y.o. [id number] here for biopsy of vaginal mass.  Presented 12/8 for PMB & vaginal mass noted CT C/A/P 01/14/24 with 3.8 x 3.3 x 3.7cm vaginal cuff mass and possible pelvic node involvement but no e/o distant mets in C/A/P Pelvic MRI completed yesterday and read is pending  Informed consent and review of risks, benefit and alternatives performed. Written consent given.   Speculum inserted into patient's vagina. Necrotic appearing, friable vaginal mass again noted. Area was prepped with betadine. 2cc of lidocaine 1% w/ epinephrine injected into biopsy site for hemostasis. Biopsies collected as well as brushings with cytobrush.  All specimens were labeled and sent to pathology.  Monsel's applied to biopsy sites for good hemostasis and speculum removed.   Pt tolerated well with minimal pain and bleeding.   Patient was given post procedure instructions.  Will follow up pathology and manage accordingly; patient will be contacted with results and recommendations.  Routine preventative health maintenance measures emphasized.  Kieth Carolin, MD Obstetrician & Gynecologist, [facility] for [facility], [facility]  "

## 2024-01-29 ENCOUNTER — Encounter: Payer: Self-pay | Admitting: Gynecologic Oncology

## 2024-01-29 ENCOUNTER — Telehealth: Payer: Self-pay | Admitting: Obstetrics and Gynecology

## 2024-01-29 NOTE — Telephone Encounter (Signed)
 Called patient & her daughter to review MRI and biopsy results. Discussed that the biopsy confirmed cancer like we expected and that there is likely lymph node involvement based on MRI/CT findings. They will follow up with Dr. Viktoria as planned on 12/26.   Kieth Carolin, MD Obstetrician & Gynecologist, [facility] for [employer], [facility]

## 2024-01-31 ENCOUNTER — Encounter: Payer: Self-pay | Admitting: Gynecologic Oncology

## 2024-01-31 ENCOUNTER — Telehealth: Payer: Self-pay | Admitting: *Deleted

## 2024-01-31 ENCOUNTER — Inpatient Hospital Stay: Admitting: Gynecologic Oncology

## 2024-01-31 VITALS — BP 165/84 | HR 75 | Temp 98.6°F | Resp 19 | Ht 63.0 in | Wt 126.6 lb

## 2024-01-31 DIAGNOSIS — N950 Postmenopausal bleeding: Secondary | ICD-10-CM

## 2024-01-31 DIAGNOSIS — N3281 Overactive bladder: Secondary | ICD-10-CM | POA: Diagnosis not present

## 2024-01-31 DIAGNOSIS — N898 Other specified noninflammatory disorders of vagina: Secondary | ICD-10-CM | POA: Diagnosis not present

## 2024-01-31 DIAGNOSIS — C52 Malignant neoplasm of vagina: Secondary | ICD-10-CM | POA: Diagnosis not present

## 2024-01-31 LAB — SURGICAL PATHOLOGY

## 2024-01-31 NOTE — Progress Notes (Signed)
 GYNECOLOGIC ONCOLOGY NEW PATIENT CONSULTATION   Patient Name: Denise Fuller  Patient Age: 88 y.o. Date of Service: 01/31/2024 Referring Provider: Kieth Carolin, MD  Primary Care Provider: Catherine Charlies LABOR, DO Consulting Provider: Comer Dollar, MD   Assessment/Plan:  Postmenopausal patient with locally advanced (Stage III vs IVA) vaginal cancer.  Discussed workup to date.  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  By imaging and exam findings today, this appears to be primary vaginal cancer.  Reviewed that given multiple lymph nodes concerning for metastatic disease, this is at least stage III disease although there are findings on MRI that suggest possible invasion at the right aspect of the bladder.  Reviewed in the setting of locally advanced stage vaginal cancer, typical treatment would include radiation with sensitizing cisplatin and brachytherapy.  Plan to have pathology add PD-L1 testing as she may benefit from addition of immunotherapy in the upfront treatment setting and as maintenance.  We discussed alternative options for treatment including radiation alone, chemotherapy alone, and no cancer directed therapy.  Given the size of the mass and symptoms (bleeding, and I suspect some of her pain is related to her cancer), I would recommend consideration of at least palliative radiation if she does not wish to pursue treatment with curative intent.  I specifically reviewed the loss of fat plane between the right aspect of the upper vagina and the bladder.  We reviewed that either with tumor progression or with tumor shrinkage with treatment, she may develop a vesicovaginal fistula.  We reviewed that this would typically present with uncontrolled leakage of urine.    Ultimately, the patient and her family were amenable to meeting with radiation oncology and medical oncology to hear more about recommended  treatment.  Referrals placed for her to meet with Denise Fuller and Denise Fuller.  A copy of this note was sent to the patient's referring provider.   65 minutes of total time was spent for this patient encounter, including preparation, face-to-face counseling with the patient and coordination of care, and documentation of the encounter.  Denise Dollar, MD  Division of Gynecologic Oncology  Department of Obstetrics and Gynecology  [facility]  ___________________________________________  Chief Complaint: Chief Complaint  Patient presents with   Vaginal mass    History of Present Illness:  Denise Fuller is a 88 y.o. y.o. female who is seen in consultation at the request of Dr. Carolin for an evaluation of vaginal cancer.  The patient initially presented in August with rectal bleeding for 2-3 weeks in the setting of constipation.  She had recently stopped her longtime use of Metamucil and noticed more issues with constipation.  She endorsed seeing older appearing blood on her pad daily although had not noticed any blood on toilet paper after wiping with bowel movements.  On exam, internal hemorrhoids were noted but fecal occult blood test was negative.  Swab of the vagina showed bright red blood.  Patient was referred to OB/GYN.  Continues to endorse persistent bleeding.  On exam, she was noted to have a fungating mass on the right side of the vagina extending from the anterior to posterior vagina with purulent discharge.  CT of the chest, abdomen, and pelvis was performed on/9.  This showed a mass within the right aspect of the vaginal cuff measuring 3.8 x 3.3 x 3.7 cm.  Soft tissue thickening throughout the lower vagina and introitus noted.  Enlarged low pelvic lymph node or soft tissue nodule adjacent to the left aspect of the introitus measuring up to 1.6 cm suspicious for nodal metastasis.  No evidence of distant metastatic disease.  MRI of the pelvis on 12/18  shows a right vaginal mass measuring up to 4.9 cm extending from the vaginal cuff to within 1 cm of the introitus compatible with primary vaginal cancer.  Possible posterior right bladder wall invasion where there is mild wall thickening and signal abnormality with loss of normal fat plane between the right vaginal wall and posterior bladder.  Mildly prominent right anterior perirectal lymph node and heterogenously enhancing mildly enlarged node in the right sciatic foramen suspicious for right pelvic nodal metastatic disease.  No left pelvic adenopathy.  Marked sigmoid diverticulosis noted.  Vaginal mass biopsy performed on 12/19 shows superficial fragments of papillary squamous cell carcinoma, definitive invasion cannot be determined due to the nature of the biopsy.  Negative for angiolymphatic invasion.  Today, the patient comes with her husband and youngest daughter.  She reports overall doing well.  Continues to have what she describes as goopy discharge.  This has the appearance of old blood.  Denies any heavy or bright red bleeding.  Notes having to urinate more since the initial swab was done at her PCPs office.  Has some urinary incontinence, occasionally has large volume urine loss.  Endorses some low back pain and tailbone pain since a fall about 4 years ago.  Has had decreased appetite since she had COVID in June, noting her taste buds are off.  Lost about 25 pounds at that time but weight has stabilized since.  Denies any nausea or vomiting.  Has struggled with constipation and treated this with Metamucil daily.  Stopped doing this when she got COVID and has struggled again with constipation.  Currently taking Metamucil every other day and having a bowel movement every 1-3 days.  Hysterectomy was performed in the setting of heavy vaginal bleeding.  Denies any abnormal Pap smears prior.  Thinks that her fallopian tubes and ovaries were taken at the time of her hysterectomy.  PAST MEDICAL  HISTORY:  Past Medical History:  Diagnosis Date   Abnormal SPEP 07/16/2019   B12 deficiency 07/16/2019   Bartonella infection    BCC (basal cell carcinoma of skin) 1997   nose   Chickenpox    Chronic bilateral low back pain without sciatica 06/23/2019   Chronic venous insufficiency    Diverticulosis    Dyspnea on exertion 07/10/2019   Elevated alkaline phosphatase level    Elevated LDH    Elevated serum immunoglobulin free light chains 07/21/2019   Environmental allergies    GAD (generalized anxiety disorder)    Goals of care, counseling/discussion 08/28/2019   Groin abscess    culture negative, doxy resolved abscess   Hepatitis A    Hyperlipidemia    Hypertension    Hypothyroidism    Ingrown nail 04/10/2018   Lightheadedness 12/03/2018   Lymphadenopathy, cervical    chronic per pt. (left)   Macular degeneration    Meniere disease    MGUS (monoclonal gammopathy of unknown significance) 08/28/2019   Other fatigue 12/03/2018   Overweight (BMI 25.0-29.9) 04/10/2018   Palpitations 12/03/2018   Thyroid  disease    Weakness generalized 12/03/2018     PAST SURGICAL HISTORY:  Past Surgical History:  Procedure Laterality Date   ABDOMINAL HYSTERECTOMY  1991   thinks BSO; done for AUB   APPENDECTOMY  1967   BASAL  CELL CARCINOMA EXCISION  1997   nose   BLEPHAROPLASTY     CATARACT EXTRACTION W/ INTRAOCULAR LENS IMPLANT Bilateral Oct and Nov 2018   CHOLECYSTECTOMY  1967   ORIF WRIST FRACTURE Left 05/18/2020    OB/GYN HISTORY:  OB History  Gravida Para Term Preterm AB Living  5 3   2 3   SAB IAB Ectopic Multiple Live Births  2        # Outcome Date GA Lbr Len/2nd Weight Sex Type Anes PTL Lv  5 SAB           4 SAB           3 Para           2 Para           1 Para             No LMP recorded. Patient has had a hysterectomy.  Age at menarche: 65  Age at menopause: at time of hysterectomy Hx of HRT: denies Hx of STDs: denies Last pap: unsure History of abnormal pap smears:  denies  SCREENING STUDIES:  Last mammogram: in her 72s  Last colonoscopy: unsure, thinks like over 20 years ago  MEDICATIONS: Outpatient Encounter Medications as of 01/31/2024  Medication Sig   Apoaequorin (PREVAGEN EXTRA STRENGTH PO) Take by mouth.   Cranberry 500 MG CAPS Take by mouth daily.   Cyanocobalamin  (B-12 COMPLIANCE INJECTION) 1000 MCG/ML KIT Inject 1,000 mcg as directed every 30 (thirty) days. 08/28/2019 Weekly   diclofenac  (VOLTAREN ) 75 MG EC tablet Take 75 mg by mouth daily.   levothyroxine  (SYNTHROID ) 100 MCG tablet Take 1 tablet (100 mcg total) by mouth daily before breakfast.   lisinopril  (ZESTRIL ) 20 MG tablet Take 1 tablet (20 mg total) by mouth daily.   Multiple Vitamins-Minerals (MULTIPLE VITAMINS/WOMENS PO) Take 1 tablet by mouth daily. Unknown strength   Multiple Vitamins-Minerals (PRESERVISION AREDS PO) Take by mouth.   Polyethyl Glycol-Propyl Glycol (SYSTANE OP) Apply 1 drop to eye as needed (Eye dryness).   tiZANidine  (ZANAFLEX ) 4 MG tablet Take 0.5 tablets (2 mg total) by mouth at bedtime.   estradiol  (ESTRACE ) 0.1 MG/GM vaginal cream Apply to vaginal area using a pea size amount on finger tip 3x weekly (Patient not taking: Reported on 01/29/2024)   [DISCONTINUED] aspirin EC 81 MG tablet Take 81 mg by mouth daily.   [DISCONTINUED] Cholecalciferol (VITAMIN D3) 1000 units CAPS Take 1 capsule by mouth daily. 2,000 units daily   [DISCONTINUED] Flaxseed, Linseed, (FLAX SEED OIL PO) Take 1,300 mg by mouth 2 (two) times daily. (Patient not taking: Reported on 01/13/2024)   [DISCONTINUED] mirabegron  ER (MYRBETRIQ ) 25 MG TB24 tablet Take 1 tablet (25 mg total) by mouth daily. (Patient not taking: Reported on 01/13/2024)   No facility-administered encounter medications on file as of 01/31/2024.    ALLERGIES:  Allergies[1]   FAMILY HISTORY:  Family History  Problem Relation Age of Onset   Arthritis Mother    Heart disease Mother    Arthritis Father    Aneurysm  Father 63       brain   Alzheimer's disease Father    Thyroid  disease Daughter    Stroke Daughter        2020   Atrial fibrillation Son        2020     SOCIAL HISTORY:  Social Connections: Moderately Isolated (10/23/2023)   Social Connection and Isolation Panel    Frequency of Communication with Friends and Family:  More than three times a week    Frequency of Social Gatherings with Friends and Family: More than three times a week    Attends Religious Services: Never    Database Administrator or Organizations: No    Attends Engineer, Structural: Never    Marital Status: Married    REVIEW OF SYSTEMS:  + Hearing loss, Constipation, frequency, blood in urine, vaginal bleeding, joint pain, easy bruising/bleeding Denies appetite changes, fevers, chills, fatigue, unexplained weight changes. Denies neck lumps or masses, mouth sores, ringing in ears or voice changes. Denies cough or wheezing.  Denies shortness of breath. Denies chest pain or palpitations. Denies leg swelling. Denies abdominal distention, pain, blood in stools, diarrhea, nausea, vomiting, or early satiety. Denies pain with intercourse, dysuria. Denies hot flashes, pelvic pain, vaginal bleeding or vaginal discharge.   Denies back pain or muscle pain/cramps. Denies itching, rash, or wounds. Denies dizziness, headaches, numbness or seizures. Denies swollen lymph nodes or glands. Denies anxiety, depression, confusion, or decreased concentration.  Physical Exam:  Vital Signs for this encounter:  Blood pressure (!) 165/84, pulse 75, temperature 98.6 F (37 C), temperature source Oral, resp. rate 19, height 5' 3 (1.6 m), weight 126 lb 9.6 oz (57.4 kg), SpO2 99%. Body mass index is 22.43 kg/m. General: Alert, oriented, no acute distress.  HEENT: Normocephalic, atraumatic. Sclera anicteric.  Chest: Clear to auscultation bilaterally. No wheezes, rhonchi, or rales. Cardiovascular: Regular rate and rhythm, no murmurs,  rubs, or gallops.  Abdomen: Normoactive bowel sounds. Soft, nondistended, nontender to palpation. No masses or hepatosplenomegaly appreciated. No palpable fluid wave.  Extremities: Grossly normal range of motion. Warm, well perfused. No edema bilaterally.  Skin: No rashes or lesions.  Lymphatics: Some mildly prominent right inguinal lymph nodes, shotty cervical lymph nodes on the left.  GU:  External female genitalia notable for loss of architecture along the posterior vagina and cigarette paper appearance of tissue around the anus consistent with lichen sclerosis.  There is also petechia around the more lateral perineal/perianal tissue.  No active bleeding noted.  No ulcerations.             Vagina: On speculum exam, there is necrotic tissue along most of the right vaginal sidewall that bleeds easily with speculum manipulation.  On bimanual exam, there is firmness/nodularity spanning from the anterior vagina to the posterior vagina from approximately 6-12 o'clock mostly centered along the right vaginal sidewall it is extending from about a centimeter inside the introitus to the apex.  On rectovaginal exam, there is no discrete extension into the rectum however firmness can be palpated on rectal exam and extending along the right paravaginal tissue although not to the sidewall.             Cervix/uterus: surgically absent.             Adnexa: No masses appreciated.  Rectal: see above.  LABORATORY AND RADIOLOGIC DATA:  Outside medical records were reviewed to synthesize the above history, along with the history and physical obtained during the visit.   Lab Results  Component Value Date   WBC 6.3 11/27/2023   HGB 13.4 11/27/2023   HCT 41.2 11/27/2023   PLT 260.0 11/27/2023   GLUCOSE 98 11/27/2023   CHOL 199 10/12/2021   TRIG 117.0 10/12/2021   HDL 57.60 10/12/2021   LDLDIRECT 134.0 10/31/2020   LDLCALC 118 (H) 10/12/2021   ALT 15 11/27/2023   AST 19 11/27/2023   NA 141 11/27/2023   K  3.6  11/27/2023   CL 103 11/27/2023   CREATININE 0.72 11/27/2023   BUN 12 11/27/2023   CO2 31 11/27/2023   TSH 2.83 01/09/2024   INR 1.0 06/29/2019   HGBA1C 5.5 10/15/2018       [1]  Allergies Allergen Reactions   Keflex [Cephalexin] Hives and Itching   Doxycycline     GI upset   Iron Rash

## 2024-01-31 NOTE — Patient Instructions (Signed)
 It was very nice to meet you well today.  Based on imaging and my exam, you have either stage III or stage IVA squamous cell carcinoma of the vagina.  Vaginal cancer is quite rare.  Much of what we know about treating vaginal cancer we take from her treatment of other types of cancers such as cervix cancer, which is much more common.  Typically, in the setting of locally advanced vaginal cancer (meaning it has spread to other areas near the vagina or to local lymph nodes), would use a combination of radiation and chemotherapy to help the radiation worked better.  We discussed that there are 2 different types of radiation used, 1 that treats the entire pelvic area and one that more locally treats the tumor itself.  I am also asking pathology to add that test on the previously done biopsy called PD-L1.  This is a marker that tells us  whether we would expect you to respond to immunotherapy.  If so, this may be something that we add to the chemotherapy for your treatment.  Other options for treatment include doing nothing, using palliative radiation to help slow growth of the tumor and hopefully help treat your symptoms, using radiation only without chemotherapy, or using chemotherapy only.  I have placed referrals for you to meet with both our radiation oncologist and our medical oncologist.  I would encourage you to meet with both of these doctors before we make a final decision about treatment moving forward.  Please use Metamucil daily and have some MiraLAX at home to help prevent constipation.

## 2024-01-31 NOTE — Telephone Encounter (Signed)
 Per Dr Viktoria sent pathology an email request for PDL1 for case [phone]

## 2024-02-03 NOTE — Progress Notes (Signed)
 " Radiation Oncology         ([phone]) [phone] ________________________________  Initial {In/Out}patient Consultation  Name: Denise Fuller MRN: [id number]  Date: 02/04/2024  DOB: [birth year]  RR:[id number], Charlies LABOR, DO  Viktoria Comer SAUNDERS, MD   REFERRING PHYSICIAN: Viktoria Comer SAUNDERS, MD  DIAGNOSIS: There were no encounter diagnoses.  Stage III vs IVA vaginal cancer   HISTORY OF PRESENT ILLNESS::[name] is a 88 y.o. female who is accompanied by ***. she is seen as a courtesy of Dr. Viktoria for an opinion concerning radiation therapy as part of management for her recently diagnosed vaginal cancer.   The patient presented to her PCP in August with complains of rectal bleeding that has persisted for the prior few weeks. At that time internal hemorrhoids were noted but fecal occult test was negative but swipe of the vagina showed bright red blood. As a result, she was referred to her [facility] where an vaginal exam noted a fungating mass on the right side of the vagina extending from the anterior to posterior vagina with purulent discharge.   She then underwent a CT CAP on 01/14/24 showing a mass within the right aspect of the vaginal cuff measuring 3.8 x 3.3 x 3.7 cm with soft tissue thickening throughout the lower vagina and introitus noted.  Scan also noted an enlarged low pelvic lymph node or soft tissue nodule adjacent to the left aspect of the introitus measuring up to 1.6 cm suspicious for nodal metastasis.  No evidence of distant metastatic disease was noted.   MRI of the pelvis on 01/23/24 shows a right vaginal mass measuring up to 4.9 cm extending from the vaginal cuff to within 1 cm of the introitus compatible with primary vaginal cancer with possible posterior right bladder wall invasion where there is mild wall thickening and signal abnormality with loss of normal fat plane between the right vaginal wall and posterior bladder.  Mildly prominent right anterior perirectal lymph node and  heterogenously enhancing mildly enlarged node in the right sciatic foramen suspicious for right pelvic nodal metastatic disease.  No left pelvic adenopathy was noted.      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 Negative for angiolymphatic invasion.   She was seen by Dr. Viktoria on 01/31/24 to discuss further treatment options where they discussed radiation with sensitizing cisplatin and brachytherapy. She will also undergo PD-L1 testing as she may benefit from addition of immunotherapy. Patient will consider her options before proceeding with a treatment plan.     Of note: patient struggles with chronic constipation for which she takes metamucil for every other day for  PREVIOUS RADIATION THERAPY: No  PAST MEDICAL HISTORY:  Past Medical History:  Diagnosis Date   Abnormal SPEP 07/16/2019   B12 deficiency 07/16/2019   Bartonella infection    BCC (basal cell carcinoma of skin) 1997   nose   Chickenpox    Chronic bilateral low back pain without sciatica 06/23/2019   Chronic venous insufficiency    Diverticulosis    Dyspnea on exertion 07/10/2019   Elevated alkaline phosphatase level    Elevated LDH    Elevated serum immunoglobulin free light chains 07/21/2019   Environmental allergies    GAD (generalized anxiety disorder)    Goals of care, counseling/discussion 08/28/2019   Groin abscess    culture negative, doxy resolved abscess   Hepatitis A  Hyperlipidemia    Hypertension    Hypothyroidism    Ingrown nail 04/10/2018   Lightheadedness 12/03/2018   Lymphadenopathy, cervical    chronic per pt. (left)   Macular degeneration    Meniere disease    MGUS (monoclonal gammopathy of unknown significance) 08/28/2019   Other fatigue 12/03/2018   Overweight (BMI 25.0-29.9) 04/10/2018   Palpitations 12/03/2018   Thyroid  disease    Weakness  generalized 12/03/2018    PAST SURGICAL HISTORY: Past Surgical History:  Procedure Laterality Date   ABDOMINAL HYSTERECTOMY  1991   thinks BSO; done for AUB   APPENDECTOMY  1967   BASAL CELL CARCINOMA EXCISION  1997   nose   BLEPHAROPLASTY     CATARACT EXTRACTION W/ INTRAOCULAR LENS IMPLANT Bilateral Oct and Nov 2018   CHOLECYSTECTOMY  1967   ORIF WRIST FRACTURE Left 05/18/2020    FAMILY HISTORY:  Family History  Problem Relation Age of Onset   Arthritis Mother    Heart disease Mother    Arthritis Father    Aneurysm Father 18       brain   Alzheimer's disease Father    Thyroid  disease Daughter    Stroke Daughter        2020   Atrial fibrillation Son        2020    SOCIAL HISTORY: Social History[1]  ALLERGIES: Allergies[2]  MEDICATIONS:  Current Outpatient Medications  Medication Sig Dispense Refill   Apoaequorin (PREVAGEN EXTRA STRENGTH PO) Take by mouth.     Cranberry 500 MG CAPS Take by mouth daily.     Cyanocobalamin  (B-12 COMPLIANCE INJECTION) 1000 MCG/ML KIT Inject 1,000 mcg as directed every 30 (thirty) days. 08/28/2019 Weekly     diclofenac  (VOLTAREN ) 75 MG EC tablet Take 75 mg by mouth daily.     estradiol  (ESTRACE ) 0.1 MG/GM vaginal cream Apply to vaginal area using a pea size amount on finger tip 3x weekly (Patient not taking: Reported on 01/29/2024) 42.5 g 12   levothyroxine  (SYNTHROID ) 100 MCG tablet Take 1 tablet (100 mcg total) by mouth daily before breakfast. 90 tablet 1   lisinopril  (ZESTRIL ) 20 MG tablet Take 1 tablet (20 mg total) by mouth daily. 90 tablet 1   Multiple Vitamins-Minerals (MULTIPLE VITAMINS/WOMENS PO) Take 1 tablet by mouth daily. Unknown strength     Multiple Vitamins-Minerals (PRESERVISION AREDS PO) Take by mouth.     Polyethyl Glycol-Propyl Glycol (SYSTANE OP) Apply 1 drop to eye as needed (Eye dryness).     tiZANidine  (ZANAFLEX ) 4 MG tablet Take 0.5 tablets (2 mg total) by mouth at bedtime. 90 tablet 1   No current  facility-administered medications for this visit.    REVIEW OF SYSTEMS:  A 10+ POINT REVIEW OF SYSTEMS WAS OBTAINED including neurology, dermatology, psychiatry, cardiac, respiratory, lymph, extremities, GI, GU, musculoskeletal, constitutional, reproductive, HEENT. ***   PHYSICAL EXAM:  vitals were not taken for this visit.   General: Alert and oriented, in no acute distress HEENT: Head is normocephalic. Extraocular movements are intact. Oropharynx is clear. Neck: Neck is supple, no palpable cervical or supraclavicular lymphadenopathy. Heart: Regular in rate and rhythm with no murmurs, rubs, or gallops. Chest: Clear to auscultation bilaterally, with no rhonchi, wheezes, or rales. Abdomen: Soft, nontender, nondistended, with no rigidity or guarding. Extremities: No cyanosis or edema. Lymphatics: see Neck Exam Skin: No concerning lesions. Musculoskeletal: symmetric strength and muscle tone throughout. Neurologic: Cranial nerves II through XII are grossly intact. No obvious focalities. Speech is fluent. Coordination is intact.  Psychiatric: Judgment and insight are intact. Affect is appropriate. ***  ECOG = ***  0 - Asymptomatic (Fully active, able to carry on all predisease activities without restriction)  1 - Symptomatic but completely ambulatory (Restricted in physically strenuous activity but ambulatory and able to carry out work of a light or sedentary nature. For example, light housework, office work)  2 - Symptomatic, <50% in bed during the day (Ambulatory and capable of all self care but unable to carry out any work activities. Up and about more than 50% of waking hours)  3 - Symptomatic, >50% in bed, but not bedbound (Capable of only limited self-care, confined to bed or chair 50% or more of waking hours)  4 - Bedbound (Completely disabled. Cannot carry on any self-care. Totally confined to bed or chair)  5 - Death   Raylene MM, Creech RH, Tormey DC, et al. [phone]). Toxicity and  response criteria of the [facility]. Am. DOROTHA Bridges. Oncol. 5 (6): 649-55  LABORATORY DATA:  Lab Results  Component Value Date   WBC 6.3 11/27/2023   HGB 13.4 11/27/2023   HCT 41.2 11/27/2023   MCV 91.6 11/27/2023   PLT 260.0 11/27/2023   NEUTROABS 3.4 11/27/2023   Lab Results  Component Value Date   NA 141 11/27/2023   K 3.6 11/27/2023   CL 103 11/27/2023   CO2 31 11/27/2023   GLUCOSE 98 11/27/2023   BUN 12 11/27/2023   CREATININE 0.72 11/27/2023   CALCIUM 9.0 11/27/2023      RADIOGRAPHY: MR PELVIS W WO CONTRAST Result Date: 01/27/2024 EXAM: MRI OF THE PELVIS WITH AND WITHOUT INTRAVENOUS CONTRAST 01/23/2024 05:32:59 PM TECHNIQUE: Multiplanar magnetic resonance images of the pelvis with and without intravenous contrast. CONTRAST: 6 mL of Gadavist . COMPARISON: None available. CLINICAL HISTORY: Vaginal mass. FINDINGS: BLADDER / URETHRA: Bladder is nondistended. Urethra is grossly normal. LYMPH NODES: Mildly prominent right anterior perirectal 0.9 cm lymph node (series 3, image 26). Heterogeneously enhancing mildly enlarged 1.2 cm node in the right sciatic foramen (series 21, image 30). No pathologically enlarged left pelvic nodes. VESSELS: No acute vascular abnormality. UTERUS / VAGINA: Status post hysterectomy. Right vaginal wall mass measures 4.9 x 3.2 x 4.0 cm (series 9, image 12 and series 3, image 28), extending from the vaginal cuff superiorly to within 1.0 cm of the introitus at the inferior margin. Mild wall thickening and signal abnormality in the posterior right bladder wall, with loss of the normal fat plane between the right vaginal wall and posterior right bladder wall, cannot exclude posterior right bladder wall invasion by the vaginal mass (series 9, images 12-15). OVARIES / ADNEXA: No adnexal masses. BONES: Moderate degenerative disc disease in the lower lumbar spine. No acute fracture or focal osseous lesion. OTHER: Marked sigmoid diverticulosis. No  small or large bowel wall thickening in the pelvis. No pelvic ascites. No fluid collections. IMPRESSION: 1. 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 Possible posterior right bladder wall invasion, see comments. 2. Mildly prominent right anterior perirectal lymph node and a heterogeneously enhancing mildly enlarged node in the right sciatic foramen, suspicious for right pelvic nodal metastatic disease. No left pelvic adenopathy. 3. Marked sigmoid diverticulosis. Electronically signed by: Selinda Blue MD 01/27/2024 12:35 PM EST RP Workstation: [id number]   CT CHEST ABDOMEN PELVIS W CONTRAST Result Date: 01/16/2024 CLINICAL DATA:  Vaginal mass *  Tracking Code: BO * EXAM: CT CHEST, ABDOMEN, AND PELVIS WITH CONTRAST TECHNIQUE: Multidetector CT imaging of the chest, abdomen and pelvis was performed following the standard protocol during bolus administration of intravenous contrast. RADIATION DOSE REDUCTION: This exam was performed according to the departmental dose-optimization program which includes automated exposure control, adjustment of the mA and/or kV according to patient size and/or use of iterative reconstruction technique. CONTRAST:  OMNIPAQUE  IOHEXOL  300 MG/ML  SOLN COMPARISON:  None Available. FINDINGS: CT CHEST FINDINGS Cardiovascular: Aortic atherosclerosis. Enlargement of the tubular ascending thoracic aorta measuring up to 4.3 x 4.2 cm. Cardiomegaly. Three-vessel coronary artery calcifications. No pericardial effusion. Mediastinum/Nodes: No enlarged mediastinal, hilar, or axillary lymph nodes. Small benign calcified granulomatous mediastinal and right hilar lymph nodes. Thyroid  gland, trachea, and esophagus demonstrate no significant findings. Lungs/Pleura: Lungs are clear. No pleural effusion or pneumothorax. Musculoskeletal: No chest wall abnormality. No  acute osseous findings. CT ABDOMEN PELVIS FINDINGS Hepatobiliary: No solid liver abnormality is seen. Cholecystectomy. Postoperative biliary ductal dilatation and pneumobilia. Pancreas: Unremarkable. No pancreatic ductal dilatation or surrounding inflammatory changes. Spleen: Normal in size without significant abnormality. Adrenals/Urinary Tract: Adrenal glands are unremarkable. Kidneys are normal, without renal calculi, solid lesion, or hydronephrosis. Bladder is unremarkable. Stomach/Bowel: Stomach is within normal limits. Appendix appears normal. No evidence of bowel wall thickening, distention, or inflammatory changes. Vascular/Lymphatic: Aortic atherosclerosis. Enlarged low pelvic lymph node or soft tissue nodule adjacent to the left aspect of the introitus measuring 1.6 x 0.8 cm (series 2, image 119). Reproductive: Hysterectomy. Mass within the right aspect of the vaginal cuff measuring 3.8 x 3.3 x 3.7 cm (series 2, image 110, series 7, image 90). Soft tissue thickening throughout the lower vagina and about the introitus (series 2, image 116). Other: No abdominal wall hernia or abnormality. No ascites. Musculoskeletal: No acute osseous findings. IMPRESSION: 1. Mass within the right aspect of the vaginal cuff measuring 3.8 x 3.3 x 3.7 cm. 2. Soft tissue thickening throughout the lower vagina and about the introitus. Contrast enhanced pelvic MRI would be the test of choice to further assess for infiltrative malignancy; soft tissue contrast of CT limited in this vicinity. 3. Enlarged low pelvic lymph node or soft tissue nodule adjacent to the left aspect of the introitus measuring 1.6 x 0.8 cm suspicious for a nodal metastasis. 4. No evidence of distant metastatic disease in the chest, abdomen, or pelvis. 5. Cardiomegaly and coronary artery disease. Aortic Atherosclerosis (ICD10-I70.0). Electronically Signed   By: Marolyn JONETTA Jaksch M.D.   On: 01/16/2024 05:31      IMPRESSION: Stage III vs IVA vaginal cancer    ***  Today, I talked to the patient and family about the findings and work-up thus far.  We discussed the natural history of *** and general treatment, highlighting the role of radiotherapy in the management.  We discussed the available radiation techniques, and focused on the details of logistics and delivery.  We reviewed the anticipated acute and late sequelae associated with radiation in this setting.  The patient was encouraged to ask questions that I answered to the best of my ability. *** A patient consent form was discussed and signed.  We retained a copy for our records.  The patient would like to proceed with radiation and will be scheduled for CT simulation.  PLAN: ***    *** minutes of total time was spent for this patient encounter, including preparation, face-to-face counseling with the patient and coordination of care, physical exam, and documentation of the encounter.   ------------------------------------------------  Lynwood CHARM Nasuti, PhD, MD  This document serves as a record of services personally performed by Lynwood Nasuti, MD. It was created on his behalf by Reymundo Cartwright, a trained medical scribe. The creation of this record is based on the scribe's personal observations and the provider's statements to them. This document has been checked and approved by the attending provider.     [1]  Social History Tobacco Use   Smoking status: Never   Smokeless tobacco: Never  Vaping Use   Vaping status: Never Used  Substance Use Topics   Alcohol use: No   Drug use: No  [2]  Allergies Allergen Reactions   Keflex [Cephalexin] Hives and Itching   Doxycycline     GI upset   Iron Rash   "

## 2024-02-03 NOTE — Progress Notes (Signed)
 GYN Location of Tumor / Histology: Vaginal   Denise Fuller presented with symptoms of: bleeding. Patient continues to have bleeding.   Biopsies revealed:    Past/Anticipated interventions by Gyn/Onc surgery, if any:    Past/Anticipated interventions by medical oncology, if any:  Patient to see Dr. Lonn on 02/11/24.   Weight changes, if any: no  Bowel/Bladder complaints, if any: Yes.  , constipation at times. Patient has started back taking metamucil  Also reports urinary incontinence.   Nausea/Vomiting, if any: no  Pain issues, if any:  Tenderness to vagina due to recent exams.   SAFETY ISSUES: Prior radiation? no Pacemaker/ICD? no Possible current pregnancy? no Is the patient on methotrexate? no  Current Complaints / other details:    BP (!) 162/85 (BP Location: Right Arm, Patient Position: Sitting)   Pulse 76   Temp 97.8 F (36.6 C) (Oral)   Resp 17   Ht 5' 3 (1.6 m)   SpO2 99%   BMI 22.43 kg/m

## 2024-02-04 ENCOUNTER — Ambulatory Visit
Admission: RE | Admit: 2024-02-04 | Discharge: 2024-02-04 | Disposition: A | Discharge status: Home / Self Care | Source: Ambulatory Visit | Attending: Radiation Oncology | Admitting: Radiation Oncology

## 2024-02-04 ENCOUNTER — Encounter: Payer: Self-pay | Admitting: Radiation Oncology

## 2024-02-04 VITALS — BP 162/85 | HR 76 | Temp 97.8°F | Resp 17 | Ht 63.0 in

## 2024-02-04 DIAGNOSIS — R590 Localized enlarged lymph nodes: Secondary | ICD-10-CM | POA: Diagnosis not present

## 2024-02-04 DIAGNOSIS — Z85828 Personal history of other malignant neoplasm of skin: Secondary | ICD-10-CM | POA: Insufficient documentation

## 2024-02-04 DIAGNOSIS — Z79899 Other long term (current) drug therapy: Secondary | ICD-10-CM | POA: Diagnosis not present

## 2024-02-04 DIAGNOSIS — I2510 Atherosclerotic heart disease of native coronary artery without angina pectoris: Secondary | ICD-10-CM | POA: Insufficient documentation

## 2024-02-04 DIAGNOSIS — H8109 Meniere's disease, unspecified ear: Secondary | ICD-10-CM | POA: Insufficient documentation

## 2024-02-04 DIAGNOSIS — Z79890 Hormone replacement therapy (postmenopausal): Secondary | ICD-10-CM | POA: Diagnosis not present

## 2024-02-04 DIAGNOSIS — Z791 Long term (current) use of non-steroidal anti-inflammatories (NSAID): Secondary | ICD-10-CM | POA: Insufficient documentation

## 2024-02-04 DIAGNOSIS — E785 Hyperlipidemia, unspecified: Secondary | ICD-10-CM | POA: Diagnosis not present

## 2024-02-04 DIAGNOSIS — K5909 Other constipation: Secondary | ICD-10-CM | POA: Diagnosis not present

## 2024-02-04 DIAGNOSIS — G8929 Other chronic pain: Secondary | ICD-10-CM | POA: Diagnosis not present

## 2024-02-04 DIAGNOSIS — N939 Abnormal uterine and vaginal bleeding, unspecified: Secondary | ICD-10-CM | POA: Insufficient documentation

## 2024-02-04 DIAGNOSIS — I700 Atherosclerosis of aorta: Secondary | ICD-10-CM | POA: Diagnosis not present

## 2024-02-04 DIAGNOSIS — I10 Essential (primary) hypertension: Secondary | ICD-10-CM | POA: Diagnosis not present

## 2024-02-04 DIAGNOSIS — B159 Hepatitis A without hepatic coma: Secondary | ICD-10-CM | POA: Diagnosis not present

## 2024-02-04 DIAGNOSIS — I872 Venous insufficiency (chronic) (peripheral): Secondary | ICD-10-CM | POA: Diagnosis not present

## 2024-02-04 DIAGNOSIS — I119 Hypertensive heart disease without heart failure: Secondary | ICD-10-CM | POA: Diagnosis not present

## 2024-02-04 DIAGNOSIS — C52 Malignant neoplasm of vagina: Secondary | ICD-10-CM

## 2024-02-04 DIAGNOSIS — E039 Hypothyroidism, unspecified: Secondary | ICD-10-CM | POA: Diagnosis not present

## 2024-02-04 DIAGNOSIS — D472 Monoclonal gammopathy: Secondary | ICD-10-CM | POA: Diagnosis not present

## 2024-02-04 DIAGNOSIS — E538 Deficiency of other specified B group vitamins: Secondary | ICD-10-CM | POA: Insufficient documentation

## 2024-02-04 DIAGNOSIS — K838 Other specified diseases of biliary tract: Secondary | ICD-10-CM | POA: Insufficient documentation

## 2024-02-05 ENCOUNTER — Encounter (HOSPITAL_COMMUNITY): Payer: Self-pay

## 2024-02-07 ENCOUNTER — Ambulatory Visit (INDEPENDENT_AMBULATORY_CARE_PROVIDER_SITE_OTHER)

## 2024-02-07 DIAGNOSIS — Z23 Encounter for immunization: Secondary | ICD-10-CM | POA: Diagnosis not present

## 2024-02-07 DIAGNOSIS — E538 Deficiency of other specified B group vitamins: Secondary | ICD-10-CM | POA: Diagnosis not present

## 2024-02-07 MED ORDER — CYANOCOBALAMIN 1000 MCG/ML IJ SOLN
1000.0000 ug | Freq: Once | INTRAMUSCULAR | Status: AC
  Administered 2024-02-07: 1000 ug via INTRAMUSCULAR

## 2024-02-07 NOTE — Progress Notes (Signed)
 Pt here for monthly B12 injection per Dr. Catherine  Injection given L arm IM, pt tolerated injection well.   Next injection scheduled for: 1 month.

## 2024-02-10 ENCOUNTER — Encounter: Payer: Self-pay | Admitting: Hematology and Oncology

## 2024-02-10 DIAGNOSIS — C52 Malignant neoplasm of vagina: Secondary | ICD-10-CM | POA: Insufficient documentation

## 2024-02-11 ENCOUNTER — Encounter: Payer: Self-pay | Admitting: Hematology and Oncology

## 2024-02-11 ENCOUNTER — Inpatient Hospital Stay: Attending: Hematology and Oncology | Admitting: Hematology and Oncology

## 2024-02-11 ENCOUNTER — Inpatient Hospital Stay

## 2024-02-11 VITALS — BP 175/85 | HR 82 | Temp 98.5°F | Resp 18 | Ht 63.0 in | Wt 126.6 lb

## 2024-02-11 DIAGNOSIS — Z79899 Other long term (current) drug therapy: Secondary | ICD-10-CM | POA: Diagnosis not present

## 2024-02-11 DIAGNOSIS — H9190 Unspecified hearing loss, unspecified ear: Secondary | ICD-10-CM

## 2024-02-11 DIAGNOSIS — C52 Malignant neoplasm of vagina: Secondary | ICD-10-CM | POA: Diagnosis not present

## 2024-02-11 DIAGNOSIS — Z5112 Encounter for antineoplastic immunotherapy: Secondary | ICD-10-CM | POA: Diagnosis present

## 2024-02-11 DIAGNOSIS — Z79620 Long term (current) use of immunosuppressive biologic: Secondary | ICD-10-CM | POA: Diagnosis not present

## 2024-02-11 MED ORDER — ONDANSETRON HCL 8 MG PO TABS: 8.0000 mg | ORAL_TABLET | Freq: Three times a day (TID) | ORAL | 1 refills | Status: AC | PRN

## 2024-02-11 MED ORDER — LIDOCAINE-PRILOCAINE 2.5-2.5 % EX CREA: TOPICAL_CREAM | CUTANEOUS | 3 refills | Status: AC

## 2024-02-11 MED ORDER — PROCHLORPERAZINE MALEATE 10 MG PO TABS: 10.0000 mg | ORAL_TABLET | Freq: Four times a day (QID) | ORAL | 1 refills | Status: AC | PRN

## 2024-02-11 NOTE — Assessment & Plan Note (Signed)
 The patient was diagnosed with vaginal cancer after presentation with vaginal bleeding in August 2025 Subsequent imaging study revealed locally advanced malignancy with vaginal mass measured close to 5 cm with possibility of posterior bladder wall invasion and suspicious regional lymphadenopathy Clinically, she has stage III disease Recent molecular testing reveal the cancer is PD-L1 positive, 25%  The patient has significant hearing loss at baseline Prior to coming in, she has made informed decision not to pursue aggressive treatment modality to preserve quality of life The patient and her family understood why surgery is not indicated We discussed the role of concurrent chemoradiation therapy, radiation therapy alone or chemotherapy alone The patient and her family ultimately decided to undergo systemic treatment only Due to her severe hearing deficit, I would not recommend cisplatin I do not believe she can tolerate combination chemotherapy I recommend treatment with single agent carboplatin with pembrolizumab We discussed risk, benefits, side effects of this option and she agreed to proceed Due to poor venous access, I will place order for port placement She will return next week with blood work, chemo education class and start of cycle 1 of treatment I plan to repeat imaging study after 3 cycles of therapy

## 2024-02-11 NOTE — Progress Notes (Signed)
 [facility] CONSULT NOTE  Patient Care Team: Catherine Charlies LABOR, DO as PCP - General (Family Medicine) Elspeth Lauraine DEL, OD as Referring Physician Vannie Hoover (Dentistry) Elisabeth Valli BIRCH, MD as Consulting Physician (Urology) Evern Donnajean Kayser, MD as Referring Physician (Orthopedic Surgery)  ASSESSMENT & PLAN:  Vaginal cancer [facility]) The patient was diagnosed with vaginal cancer after presentation with vaginal bleeding in August 2025 Subsequent imaging study revealed locally advanced malignancy with vaginal mass measured close to 5 cm with possibility of posterior bladder wall invasion and suspicious regional lymphadenopathy Clinically, she has stage III disease Recent molecular testing reveal the cancer is PD-L1 positive, 25%  The patient has significant hearing loss at baseline Prior to coming in, she has made informed decision not to pursue aggressive treatment modality to preserve quality of life The patient and her family understood why surgery is not indicated We discussed the role of concurrent chemoradiation therapy, radiation therapy alone or chemotherapy alone The patient and her family ultimately decided to undergo systemic treatment only Due to her severe hearing deficit, I would not recommend cisplatin I do not believe she can tolerate combination chemotherapy I recommend treatment with single agent carboplatin with pembrolizumab We discussed risk, benefits, side effects of this option and she agreed to proceed Due to poor venous access, I will place order for port placement She will return next week with blood work, chemo education class and start of cycle 1 of treatment I plan to repeat imaging study after 3 cycles of therapy  Orders Placed This Encounter  Procedures   Consent Attestation for Oncology Treatment    The patient is informed of risks, benefits, side-effects of the prescribed oncology treatment. Potential short term and long term side  effects and response rates discussed. After a long discussion, the patient made informed decision to proceed.:   Yes   IR IMAGING GUIDED PORT INSERTION    Call daughter Denise Fuller for appt    Standing Status:   Future    Expected Date:   02/18/2024    Expiration Date:   02/10/2025    Reason for Exam (SYMPTOM  OR DIAGNOSIS REQUIRED):   need chemo on 2/16    Preferred Imaging Location?:   [facility]   CBC with Differential (Cancer Center Only)    Standing Status:   Future    Expected Date:   04/03/2024    Expiration Date:   04/03/2025   CMP (Cancer Center only)    Standing Status:   Future    Expected Date:   04/03/2024    Expiration Date:   04/03/2025   T4    Standing Status:   Future    Expected Date:   04/03/2024    Expiration Date:   04/03/2025   TSH    Standing Status:   Future    Expected Date:   04/03/2024    Expiration Date:   04/03/2025   CBC with Differential (Cancer Center Only)    Standing Status:   Future    Expected Date:   09/18/2024    Expiration Date:   09/18/2025   CMP (Cancer Center only)    Standing Status:   Future    Expected Date:   09/18/2024    Expiration Date:   09/18/2025   T4    Standing Status:   Future    Expected Date:   09/18/2024    Expiration Date:   09/18/2025   TSH    Standing Status:  Future    Expected Date:   09/18/2024    Expiration Date:   09/18/2025   CBC with Differential (Cancer Center Only)    Standing Status:   Future    Expected Date:   10/30/2024    Expiration Date:   10/30/2025   CMP (Cancer Center only)    Standing Status:   Future    Expected Date:   10/30/2024    Expiration Date:   10/30/2025   T4    Standing Status:   Future    Expected Date:   10/30/2024    Expiration Date:   10/30/2025   TSH    Standing Status:   Future    Expected Date:   10/30/2024    Expiration Date:   10/30/2025   CBC with Differential (Cancer Center Only)    Standing Status:   Future    Expected Date:   12/11/2024    Expiration Date:   12/11/2025   CMP  (Cancer Center only)    Standing Status:   Future    Expected Date:   12/11/2024    Expiration Date:   12/11/2025   T4    Standing Status:   Future    Expected Date:   12/11/2024    Expiration Date:   12/11/2025   TSH    Standing Status:   Future    Expected Date:   12/11/2024    Expiration Date:   12/11/2025   CBC with Differential (Cancer Center Only)    Standing Status:   Future    Expected Date:   01/22/2025    Expiration Date:   01/22/2026   CMP (Cancer Center only)    Standing Status:   Future    Expected Date:   01/22/2025    Expiration Date:   01/22/2026   T4    Standing Status:   Future    Expected Date:   01/22/2025    Expiration Date:   01/22/2026   TSH    Standing Status:   Future    Expected Date:   01/22/2025    Expiration Date:   01/22/2026   CBC with Differential (Cancer Center Only)    Standing Status:   Future    Expected Date:   02/21/2024    Expiration Date:   02/20/2025   CMP (Cancer Center only)    Standing Status:   Future    Expected Date:   02/21/2024    Expiration Date:   02/20/2025   T4    Standing Status:   Future    Expected Date:   02/21/2024    Expiration Date:   02/20/2025   TSH    Standing Status:   Future    Expected Date:   02/21/2024    Expiration Date:   02/20/2025   CBC with Differential (Cancer Center Only)    Standing Status:   Future    Expected Date:   03/13/2024    Expiration Date:   03/13/2025   CMP (Cancer Center only)    Standing Status:   Future    Expected Date:   03/13/2024    Expiration Date:   03/13/2025   CBC with Differential (Cancer Center Only)    Standing Status:   Future    Expected Date:   04/24/2024    Expiration Date:   04/24/2025   CMP (Cancer Center only)    Standing Status:   Future    Expected Date:   04/24/2024  Expiration Date:   04/24/2025   CBC with Differential (Cancer Center Only)    Standing Status:   Future    Expected Date:   05/15/2024    Expiration Date:   05/15/2025   CMP (Cancer Center only)     Standing Status:   Future    Expected Date:   05/15/2024    Expiration Date:   05/15/2025   CBC with Differential (Cancer Center Only)    Standing Status:   Future    Expected Date:   06/05/2024    Expiration Date:   06/05/2025   CMP (Cancer Center only)    Standing Status:   Future    Expected Date:   06/05/2024    Expiration Date:   06/05/2025   T4    Standing Status:   Future    Expected Date:   06/05/2024    Expiration Date:   06/05/2025   TSH    Standing Status:   Future    Expected Date:   06/05/2024    Expiration Date:   06/05/2025   CBC with Differential (Cancer Center Only)    Standing Status:   Future    Expected Date:   06/26/2024    Expiration Date:   06/26/2025   CMP (Cancer Center only)    Standing Status:   Future    Expected Date:   06/26/2024    Expiration Date:   06/26/2025   T4    Standing Status:   Future    Expected Date:   06/26/2024    Expiration Date:   06/26/2025   TSH    Standing Status:   Future    Expected Date:   06/26/2024    Expiration Date:   06/26/2025   CBC with Differential (Cancer Center Only)    Standing Status:   Future    Expected Date:   08/07/2024    Expiration Date:   08/07/2025   CMP (Cancer Center only)    Standing Status:   Future    Expected Date:   08/07/2024    Expiration Date:   08/07/2025   T4    Standing Status:   Future    Expected Date:   08/07/2024    Expiration Date:   08/07/2025   TSH    Standing Status:   Future    Expected Date:   08/07/2024    Expiration Date:   08/07/2025   [facility] PHYSICIAN COMMUNICATION 1    Thyroid  function tests at baseline and every 3rd cycle.   ONCBCN PHYSICIAN COMMUNICATION 4    0 Number of doses of carboplatin received at [facility] or outside facility. NG signature of Provider. If patient has received greater than 5 doses of carboplatin, the following pre-medications should be ordered: dexamethasone , diphenhydramine , and formulary histamine H2 antagonist. If patient cannot tolerate oral histamine H2 antagonist, IV may  be given.   ONCBCN PHYSICIAN COMMUNICATION 2    Per Economist, et al. Patients received 200 mg of pembrolizumab every 3 weeks in combination with chemotherapy for 6 cycles, followed by 400 mg of pembrolizumab every 6 weeks for up to 14 cycles (up to 20 cycles total of pembrolizumab)    The total time spent in the appointment was 60 minutes encounter with patients including review of chart and various tests results, discussions about plan of care and coordination of care plan   All questions were answered. The patient knows to call the clinic with any problems, questions or concerns.  No barriers to learning was detected.  Almarie Bedford, MD 1/6/20264:46 PM  CHIEF COMPLAINTS/PURPOSE OF CONSULTATION:  Vaginal cancer  HISTORY OF PRESENTING ILLNESS:  Denise Fuller 89 y.o. female is here because of recent diagnosis of vaginal cancer She is here accompanied by her husband and her daughter Both the patient and her husband has hearing deficit I tried my best to speak as clearly as possible Her daughter, Denise Fuller was able to help make her parents understand the content of the conversation  I have reviewed her chart and materials related to her cancer extensively and collaborated history with the patient. Summary of oncologic history is as follows: Oncology History  Vaginal cancer (HCC)  09/27/2023 Initial Diagnosis   The patient initially presented in August with rectal bleeding for 2-3 weeks in the setting of constipation.  She had recently stopped her longtime use of Metamucil and noticed more issues with constipation.  She endorsed seeing older appearing blood on her pad daily although had not noticed any blood on toilet paper after wiping with bowel movements.   On exam, internal hemorrhoids were noted but fecal occult blood test was negative.  Swab of the vagina showed bright red blood.  Patient was referred to OB/GYN.  Continues to endorse persistent bleeding.  On exam, she was noted to have a  fungating mass on the right side of the vagina extending from the anterior to posterior vagina with purulent discharge.   01/14/2024 Imaging   MR PELVIS W WO CONTRAST Result Date: 01/27/2024 EXAM: MRI OF THE PELVIS WITH AND WITHOUT INTRAVENOUS CONTRAST 01/23/2024 05:32:59 PM TECHNIQUE: Multiplanar magnetic resonance images of the pelvis with and without intravenous contrast. CONTRAST: 6 mL of Gadavist . COMPARISON: None available. CLINICAL HISTORY: Vaginal mass. FINDINGS: BLADDER / URETHRA: Bladder is nondistended. Urethra is grossly normal. LYMPH NODES: Mildly prominent right anterior perirectal 0.9 cm lymph node (series 3, image 26). Heterogeneously enhancing mildly enlarged 1.2 cm node in the right sciatic foramen (series 21, image 30). No pathologically enlarged left pelvic nodes. VESSELS: No acute vascular abnormality. UTERUS / VAGINA: Status post hysterectomy. Right vaginal wall mass measures 4.9 x 3.2 x 4.0 cm (series 9, image 12 and series 3, image 28), extending from the vaginal cuff superiorly to within 1.0 cm of the introitus at the inferior margin. Mild wall thickening and signal abnormality in the posterior right bladder wall, with loss of the normal fat plane between the right vaginal wall and posterior right bladder wall, cannot exclude posterior right bladder wall invasion by the vaginal mass (series 9, images 12-15). OVARIES / ADNEXA: No adnexal masses. BONES: Moderate degenerative disc disease in the lower lumbar spine. No acute fracture or focal osseous lesion. OTHER: Marked sigmoid diverticulosis. No small or large bowel wall thickening in the pelvis. No pelvic ascites. No fluid collections. IMPRESSION: 1. 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 Possible posterior right bladder wall invasion, see comments. 2. Mildly prominent right anterior perirectal  lymph node and a heterogeneously enhancing mildly enlarged node in the right sciatic foramen, suspicious for right pelvic nodal metastatic disease. No left pelvic adenopathy. 3. Marked sigmoid diverticulosis. Electronically signed by: Selinda Blue MD 01/27/2024 12:35 PM EST RP Workstation: [id number]   CT CHEST ABDOMEN PELVIS W CONTRAST Result Date: 01/16/2024 CLINICAL DATA:  Vaginal mass * Tracking Code: BO * EXAM: CT CHEST, ABDOMEN, AND PELVIS WITH  CONTRAST TECHNIQUE: Multidetector CT imaging of the chest, abdomen and pelvis was performed following the standard protocol during bolus administration of intravenous contrast. RADIATION DOSE REDUCTION: This exam was performed according to the departmental dose-optimization program which includes automated exposure control, adjustment of the mA and/or kV according to patient size and/or use of iterative reconstruction technique. CONTRAST:  OMNIPAQUE  IOHEXOL  300 MG/ML  SOLN COMPARISON:  None Available. FINDINGS: CT CHEST FINDINGS Cardiovascular: Aortic atherosclerosis. Enlargement of the tubular ascending thoracic aorta measuring up to 4.3 x 4.2 cm. Cardiomegaly. Three-vessel coronary artery calcifications. No pericardial effusion. Mediastinum/Nodes: No enlarged mediastinal, hilar, or axillary lymph nodes. Small benign calcified granulomatous mediastinal and right hilar lymph nodes. Thyroid  gland, trachea, and esophagus demonstrate no significant findings. Lungs/Pleura: Lungs are clear. No pleural effusion or pneumothorax. Musculoskeletal: No chest wall abnormality. No acute osseous findings. CT ABDOMEN PELVIS FINDINGS Hepatobiliary: No solid liver abnormality is seen. Cholecystectomy. Postoperative biliary ductal dilatation and pneumobilia. Pancreas: Unremarkable. No pancreatic ductal dilatation or surrounding inflammatory changes. Spleen: Normal in size without significant abnormality. Adrenals/Urinary Tract: Adrenal glands are unremarkable. Kidneys are normal,  without renal calculi, solid lesion, or hydronephrosis. Bladder is unremarkable. Stomach/Bowel: Stomach is within normal limits. Appendix appears normal. No evidence of bowel wall thickening, distention, or inflammatory changes. Vascular/Lymphatic: Aortic atherosclerosis. Enlarged low pelvic lymph node or soft tissue nodule adjacent to the left aspect of the introitus measuring 1.6 x 0.8 cm (series 2, image 119). Reproductive: Hysterectomy. Mass within the right aspect of the vaginal cuff measuring 3.8 x 3.3 x 3.7 cm (series 2, image 110, series 7, image 90). Soft tissue thickening throughout the lower vagina and about the introitus (series 2, image 116). Other: No abdominal wall hernia or abnormality. No ascites. Musculoskeletal: No acute osseous findings. IMPRESSION: 1. Mass within the right aspect of the vaginal cuff measuring 3.8 x 3.3 x 3.7 cm. 2. Soft tissue thickening throughout the lower vagina and about the introitus. Contrast enhanced pelvic MRI would be the test of choice to further assess for infiltrative malignancy; soft tissue contrast of CT limited in this vicinity. 3. Enlarged low pelvic lymph node or soft tissue nodule adjacent to the left aspect of the introitus measuring 1.6 x 0.8 cm suspicious for a nodal metastasis. 4. No evidence of distant metastatic disease in the chest, abdomen, or pelvis. 5. Cardiomegaly and coronary artery disease. Aortic Atherosclerosis (ICD10-I70.0). Electronically Signed   By: Marolyn JONETTA Jaksch M.D.   On: 01/16/2024 05:31      01/24/2024 Pathology Results   SURGICAL PATHOLOGY   CASE: [id number]  PATIENT: [facility]  Surgical Pathology Report    Reason for Addendum #1:  Immunohistochemistry results   Clinical History: vaginal mass (cm)   FINAL MICROSCOPIC DIAGNOSIS:   A. 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  - Due to the nature of the biopsy definite invasion cannot be  determined.  - Negative for  angiolymphatic invasion.   ADDENDUM:  The carcinoma is p16 positive.   PD-L 1 25%   02/10/2024 Initial Diagnosis   Vaginal cancer (HCC)   02/10/2024 Cancer Staging   Staging form: Vagina, AJCC 8th Edition - Clinical: Stage III (cT3, cN1, cM0) - Signed by Lonn Hicks, MD on 02/10/2024 Stage prefix: Initial diagnosis   02/21/2024 -  Chemotherapy   Patient is on Treatment Plan : Vaginal cancer Pembrolizumab (200), Carboplatin (5) q21d x 6 cycles / Pembrolizumab (400) q42d       MEDICAL HISTORY:  Past Medical  History:  Diagnosis Date   Abnormal SPEP 07/16/2019   B12 deficiency 07/16/2019   Bartonella infection    BCC (basal cell carcinoma of skin) 1997   nose   Chickenpox    Chronic bilateral low back pain without sciatica 06/23/2019   Chronic venous insufficiency    Diverticulosis    Dyspnea on exertion 07/10/2019   Elevated alkaline phosphatase level    Elevated LDH    Elevated serum immunoglobulin free light chains 07/21/2019   Environmental allergies    GAD (generalized anxiety disorder)    Goals of care, counseling/discussion 08/28/2019   Groin abscess    culture negative, doxy resolved abscess   Hepatitis A    Hyperlipidemia    Hypertension    Hypothyroidism    Ingrown nail 04/10/2018   Lightheadedness 12/03/2018   Lymphadenopathy, cervical    chronic per pt. (left)   Macular degeneration    Meniere disease    MGUS (monoclonal gammopathy of unknown significance) 08/28/2019   Other fatigue 12/03/2018   Overweight (BMI 25.0-29.9) 04/10/2018   Palpitations 12/03/2018   Thyroid  disease    Weakness generalized 12/03/2018    SURGICAL HISTORY: Past Surgical History:  Procedure Laterality Date   ABDOMINAL HYSTERECTOMY  1991   thinks BSO; done for AUB   APPENDECTOMY  1967   BASAL CELL CARCINOMA EXCISION  1997   nose   BLEPHAROPLASTY     CATARACT EXTRACTION W/ INTRAOCULAR LENS IMPLANT Bilateral Oct and Nov 2018   CHOLECYSTECTOMY  1967   ORIF WRIST FRACTURE Left 05/18/2020     SOCIAL HISTORY: Social History   Socioeconomic History   Marital status: Married    Spouse name: Elgin   Number of children: 3   Years of education: Not on file   Highest education level: Not on file  Occupational History   Occupation: retired  Tobacco Use   Smoking status: Never   Smokeless tobacco: Never  Vaping Use   Vaping status: Never Used  Substance and Sexual Activity   Alcohol use: No   Drug use: No   Sexual activity: Yes    Partners: Male    Comment: married  Other Topics Concern   Not on file  Social History Narrative   Retired. Married to [place]. They have 3 children Lawrance Mickey Arnulfo Denise Fuller)   Moved from [place]  05/2014.    Has 2 older dogs.    Retired.    Drinks caffeine, takes herbal remedies and dialy vitamin.   Wears her seatbelt, smoke detector in the home   Wears dentures, no assistive devices for walking - independent.    Feels safe in her relationships.    Social Drivers of Health   Tobacco Use: Low Risk (02/11/2024)   Patient History    Smoking Tobacco Use: Never    Smokeless Tobacco Use: Never    Passive Exposure: Not on file  Financial Resource Strain: Low Risk (10/23/2023)   Overall Financial Resource Strain (CARDIA)    Difficulty of Paying Living Expenses: Not hard at all  Food Insecurity: No Food Insecurity (10/23/2023)   Epic    Worried About Programme Researcher, Broadcasting/film/video in the Last Year: Never true    Ran Out of Food in the Last Year: Never true  Transportation Needs: No Transportation Needs (10/23/2023)   Epic    Lack of Transportation (Medical): No    Lack of Transportation (Non-Medical): No  Physical Activity: Sufficiently Active (10/23/2023)   Exercise Vital Sign    Days of Exercise  per Week: 5 days    Minutes of Exercise per Session: 30 min  Stress: No Stress Concern Present (10/23/2023)   [employer] of Occupational Health - Occupational Stress Questionnaire    Feeling of Stress: Not at all  Social Connections: Moderately Isolated  (10/23/2023)   Social Connection and Isolation Panel    Frequency of Communication with Friends and Family: More than three times a week    Frequency of Social Gatherings with Friends and Family: More than three times a week    Attends Religious Services: Never    Database Administrator or Organizations: No    Attends Banker Meetings: Never    Marital Status: Married  Catering Manager Violence: Not At Risk (10/23/2023)   Epic    Fear of Current or Ex-Partner: No    Emotionally Abused: No    Physically Abused: No    Sexually Abused: No  Depression (PHQ2-9): Low Risk (11/27/2023)   Depression (PHQ2-9)    PHQ-2 Score: 0  Recent Concern: Depression (PHQ2-9) - Medium Risk (10/23/2023)   Depression (PHQ2-9)    PHQ-2 Score: 7  Alcohol Screen: Low Risk (10/23/2023)   Alcohol Screen    Last Alcohol Screening Score (AUDIT): 0  Housing: Unknown (10/23/2023)   Epic    Unable to Pay for Housing in the Last Year: No    Number of Times Moved in the Last Year: Not on file    Homeless in the Last Year: No  Utilities: Not At Risk (10/23/2023)   Epic    Threatened with loss of utilities: No  Health Literacy: Adequate Health Literacy (10/23/2023)   B1300 Health Literacy    Frequency of need for help with medical instructions: Never    FAMILY HISTORY: Family History  Problem Relation Age of Onset   Arthritis Mother    Heart disease Mother    Arthritis Father    Aneurysm Father 56       brain   Alzheimer's disease Father    Thyroid  disease Daughter    Stroke Daughter        2020   Atrial fibrillation Son        2020    ALLERGIES:  is allergic to keflex [cephalexin], doxycycline, and iron.  MEDICATIONS:  Current Outpatient Medications  Medication Sig Dispense Refill   Apoaequorin (PREVAGEN EXTRA STRENGTH PO) Take by mouth.     Cranberry 500 MG CAPS Take by mouth daily.     Cyanocobalamin  (B-12 COMPLIANCE INJECTION) 1000 MCG/ML KIT Inject 1,000 mcg as directed every 30  (thirty) days. 08/28/2019 Weekly     diclofenac  (VOLTAREN ) 75 MG EC tablet Take 75 mg by mouth daily.     estradiol  (ESTRACE ) 0.1 MG/GM vaginal cream Apply to vaginal area using a pea size amount on finger tip 3x weekly (Patient not taking: Reported on 01/29/2024) 42.5 g 12   levothyroxine  (SYNTHROID ) 100 MCG tablet Take 1 tablet (100 mcg total) by mouth daily before breakfast. 90 tablet 1   lidocaine -prilocaine  (EMLA ) cream Apply to affected area once 30 g 3   lisinopril  (ZESTRIL ) 20 MG tablet Take 1 tablet (20 mg total) by mouth daily. 90 tablet 1   Multiple Vitamins-Minerals (MULTIPLE VITAMINS/WOMENS PO) Take 1 tablet by mouth daily. Unknown strength     Multiple Vitamins-Minerals (PRESERVISION AREDS PO) Take by mouth.     ondansetron  (ZOFRAN ) 8 MG tablet Take 1 tablet (8 mg total) by mouth every 8 (eight) hours as needed for nausea or vomiting.  Start on the third day after carboplatin. 30 tablet 1   Polyethyl Glycol-Propyl Glycol (SYSTANE OP) Apply 1 drop to eye as needed (Eye dryness).     prochlorperazine  (COMPAZINE ) 10 MG tablet Take 1 tablet (10 mg total) by mouth every 6 (six) hours as needed for nausea or vomiting. 30 tablet 1   tiZANidine  (ZANAFLEX ) 4 MG tablet Take 0.5 tablets (2 mg total) by mouth at bedtime. 90 tablet 1   No current facility-administered medications for this visit.    REVIEW OF SYSTEMS: She has chronic hearing deficit Constitutional: Denies fevers, chills or abnormal night sweats Eyes: Denies blurriness of vision, double vision or watery eyes Ears, nose, mouth, throat, and face: Denies mucositis or sore throat Respiratory: Denies cough, dyspnea or wheezes Cardiovascular: Denies palpitation, chest discomfort or lower extremity swelling Gastrointestinal:  Denies nausea, heartburn or change in bowel habits Skin: Denies abnormal skin rashes Lymphatics: Denies new lymphadenopathy or easy bruising Neurological:Denies numbness, tingling or new  weaknesses Behavioral/Psych: Mood is stable, no new changes  All other systems were reviewed with the patient and are negative.  PHYSICAL EXAMINATION: ECOG PERFORMANCE STATUS: 1 - Symptomatic but completely ambulatory  Vitals:   02/11/24 1330  BP: (!) 175/85  Pulse: 82  Resp: 18  Temp: 98.5 F (36.9 C)  SpO2: 100%   Filed Weights   02/11/24 1330  Weight: 126 lb 9.6 oz (57.4 kg)    GENERAL:alert, no distress and comfortable SKIN: skin color, texture, turgor are normal, no rashes or significant lesions EYES: normal, conjunctiva are pink and non-injected, sclera clear OROPHARYNX:no exudate, no erythema and lips, buccal mucosa, and tongue normal  NECK: supple, thyroid  normal size, non-tender, without nodularity LYMPH:  no palpable lymphadenopathy in the cervical, axillary or inguinal LUNGS: clear to auscultation and percussion with normal breathing effort HEART: regular rate & rhythm and no murmurs and no lower extremity edema ABDOMEN:abdomen soft, non-tender and normal bowel sounds Musculoskeletal:no cyanosis of digits and no clubbing  PSYCH: alert & oriented x 3 with fluent speech NEURO: no focal motor/sensory deficits  LABORATORY DATA:  I have reviewed the data as listed Lab Results  Component Value Date   WBC 6.3 11/27/2023   HGB 13.4 11/27/2023   HCT 41.2 11/27/2023   MCV 91.6 11/27/2023   PLT 260.0 11/27/2023   Recent Labs    06/12/23 0909 11/27/23 0904  NA 139 141  K 3.9 3.6  CL 103 103  CO2 30 31  GLUCOSE 77 98  BUN 12 12  CREATININE 0.75 0.72  CALCIUM 9.0 9.0  PROT 6.1  6.3 6.4  6.3  ALBUMIN 4.1 4.0  AST 19 19  ALT 14 15  ALKPHOS 146* 107  BILITOT 0.5 0.5    RADIOGRAPHIC STUDIES: I have personally reviewed the radiological images as listed and agreed with the findings in the report. MR PELVIS W WO CONTRAST Result Date: 01/27/2024 EXAM: MRI OF THE PELVIS WITH AND WITHOUT INTRAVENOUS CONTRAST 01/23/2024 05:32:59 PM TECHNIQUE: Multiplanar  magnetic resonance images of the pelvis with and without intravenous contrast. CONTRAST: 6 mL of Gadavist . COMPARISON: None available. CLINICAL HISTORY: Vaginal mass. FINDINGS: BLADDER / URETHRA: Bladder is nondistended. Urethra is grossly normal. LYMPH NODES: Mildly prominent right anterior perirectal 0.9 cm lymph node (series 3, image 26). Heterogeneously enhancing mildly enlarged 1.2 cm node in the right sciatic foramen (series 21, image 30). No pathologically enlarged left pelvic nodes. VESSELS: No acute vascular abnormality. UTERUS / VAGINA: Status post hysterectomy. Right vaginal wall mass measures 4.9  x 3.2 x 4.0 cm (series 9, image 12 and series 3, image 28), extending from the vaginal cuff superiorly to within 1.0 cm of the introitus at the inferior margin. Mild wall thickening and signal abnormality in the posterior right bladder wall, with loss of the normal fat plane between the right vaginal wall and posterior right bladder wall, cannot exclude posterior right bladder wall invasion by the vaginal mass (series 9, images 12-15). OVARIES / ADNEXA: No adnexal masses. BONES: Moderate degenerative disc disease in the lower lumbar spine. No acute fracture or focal osseous lesion. OTHER: Marked sigmoid diverticulosis. No small or large bowel wall thickening in the pelvis. No pelvic ascites. No fluid collections. IMPRESSION: 1. 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 Possible posterior right bladder wall invasion, see comments. 2. Mildly prominent right anterior perirectal lymph node and a heterogeneously enhancing mildly enlarged node in the right sciatic foramen, suspicious for right pelvic nodal metastatic disease. No left pelvic adenopathy. 3. Marked sigmoid diverticulosis. Electronically signed by: Selinda Blue MD 01/27/2024 12:35 PM EST RP Workstation: [id number]    CT CHEST ABDOMEN PELVIS W CONTRAST Result Date: 01/16/2024 CLINICAL DATA:  Vaginal mass * Tracking Code: BO * EXAM: CT CHEST, ABDOMEN, AND PELVIS WITH CONTRAST TECHNIQUE: Multidetector CT imaging of the chest, abdomen and pelvis was performed following the standard protocol during bolus administration of intravenous contrast. RADIATION DOSE REDUCTION: This exam was performed according to the departmental dose-optimization program which includes automated exposure control, adjustment of the mA and/or kV according to patient size and/or use of iterative reconstruction technique. CONTRAST:  OMNIPAQUE  IOHEXOL  300 MG/ML  SOLN COMPARISON:  None Available. FINDINGS: CT CHEST FINDINGS Cardiovascular: Aortic atherosclerosis. Enlargement of the tubular ascending thoracic aorta measuring up to 4.3 x 4.2 cm. Cardiomegaly. Three-vessel coronary artery calcifications. No pericardial effusion. Mediastinum/Nodes: No enlarged mediastinal, hilar, or axillary lymph nodes. Small benign calcified granulomatous mediastinal and right hilar lymph nodes. Thyroid  gland, trachea, and esophagus demonstrate no significant findings. Lungs/Pleura: Lungs are clear. No pleural effusion or pneumothorax. Musculoskeletal: No chest wall abnormality. No acute osseous findings. CT ABDOMEN PELVIS FINDINGS Hepatobiliary: No solid liver abnormality is seen. Cholecystectomy. Postoperative biliary ductal dilatation and pneumobilia. Pancreas: Unremarkable. No pancreatic ductal dilatation or surrounding inflammatory changes. Spleen: Normal in size without significant abnormality. Adrenals/Urinary Tract: Adrenal glands are unremarkable. Kidneys are normal, without renal calculi, solid lesion, or hydronephrosis. Bladder is unremarkable. Stomach/Bowel: Stomach is within normal limits. Appendix appears normal. No evidence of bowel wall thickening, distention, or inflammatory changes. Vascular/Lymphatic: Aortic atherosclerosis. Enlarged low pelvic lymph  node or soft tissue nodule adjacent to the left aspect of the introitus measuring 1.6 x 0.8 cm (series 2, image 119). Reproductive: Hysterectomy. Mass within the right aspect of the vaginal cuff measuring 3.8 x 3.3 x 3.7 cm (series 2, image 110, series 7, image 90). Soft tissue thickening throughout the lower vagina and about the introitus (series 2, image 116). Other: No abdominal wall hernia or abnormality. No ascites. Musculoskeletal: No acute osseous findings. IMPRESSION: 1. Mass within the right aspect of the vaginal cuff measuring 3.8 x 3.3 x 3.7 cm. 2. Soft tissue thickening throughout the lower vagina and about the introitus. Contrast enhanced pelvic MRI would be the test of choice to further assess for infiltrative malignancy; soft tissue contrast of CT limited in this vicinity. 3. Enlarged low pelvic lymph node  or soft tissue nodule adjacent to the left aspect of the introitus measuring 1.6 x 0.8 cm suspicious for a nodal metastasis. 4. No evidence of distant metastatic disease in the chest, abdomen, or pelvis. 5. Cardiomegaly and coronary artery disease. Aortic Atherosclerosis (ICD10-I70.0). Electronically Signed   By: Marolyn JONETTA Jaksch M.D.   On: 01/16/2024 05:31

## 2024-02-12 ENCOUNTER — Other Ambulatory Visit: Payer: Self-pay

## 2024-02-13 ENCOUNTER — Telehealth: Payer: Self-pay

## 2024-02-13 ENCOUNTER — Ambulatory Visit: Admitting: Radiation Oncology

## 2024-02-13 NOTE — Telephone Encounter (Signed)
 Oral Oncology Patient Advocate Encounter   Received notification that prior authorization for Lido/Prilo Cream is required.   PA submitted on 02/13/2024 Key [id number] Status is pending      Charlott Hamilton,  CPhT-Adv  she/her/hers [facility]  [facility] Pharmacy Technician Patient Advocate Specialist III [facility] Phone: [phone]  Fax: [phone] [email or web address]

## 2024-02-13 NOTE — Telephone Encounter (Signed)
 Oral Oncology Patient Advocate Encounter   Received notification that prior authorization for Ondansetron  is required.   PA submitted on 02/13/2024 Key [id number] Status is pending      Charlott Hamilton,  CPhT-Adv  she/her/hers [facility]  [facility] Pharm[facility] Services Pharmacy Technician Patient Advocate Specialist III [facility] Phone: [phone]  Fax: [phone] [email or web address]

## 2024-02-13 NOTE — Telephone Encounter (Signed)
 Oral Oncology Patient Advocate Encounter  Prior Authorization for Ondansetron  has been approved.    Key [id number]  Effective dates: 02-13-24 through 02-04-25  Patient notified via MyChart  Charlott Hamilton,  CPhT-Adv  she/her/hers [facility]  [facility] Services Pharmacy Technician Patient Advocate Specialist III [facility] Phone: [phone]  Fax: [phone] [email or web address]

## 2024-02-13 NOTE — Telephone Encounter (Signed)
 Oral Oncology Patient Advocate Encounter  Prior Authorization for Lido/Prilo Cream  has been approved.    PA# [id number] Effective dates: 02-13-24 through 05-13-24   Patient has been notified via MyChart  Charlott Hamilton,  CPhT-Adv  she/her/hers [facility]  [facility] Pharm[facility] Services Pharmacy Technician Patient Advocate Specialist III [facility] Phone: [phone]  Fax: [phone] [email or web address]

## 2024-02-14 NOTE — Progress Notes (Signed)
 Pharmacist Chemotherapy Monitoring - Initial Assessment    Anticipated start date: 02/21/2024   The following has been reviewed per standard work regarding the patient's treatment regimen: The patient's diagnosis, treatment plan and drug doses, and organ/hematologic function Lab orders and baseline tests specific to treatment regimen  The treatment plan start date, drug sequencing, and pre-medications Prior authorization status  Patient's documented medication list, including drug-drug interaction screen and prescriptions for anti-emetics and supportive care specific to the treatment regimen The drug concentrations, fluid compatibility, administration routes, and timing of the medications to be used The patient's access for treatment and lifetime cumulative dose history, if applicable  The patient's medication allergies and previous infusion related reactions, if applicable   Changes made to treatment plan:  N/A  Follow up needed:  N/A   Marlee Eleanor Neighbors, RPH, 02/14/2024  8:27 AM

## 2024-02-18 ENCOUNTER — Other Ambulatory Visit: Payer: Self-pay | Admitting: Radiology

## 2024-02-18 NOTE — H&P (Signed)
 "  Chief Complaint: Recently diagnosed vaginal cancer; referred for port a cath placement to assist with treatment  Referring Provider(s): Gorsuch,N  Supervising Physician: Vanice Revel  Patient Status: [facility] - Out-pt  History of Present Illness: Denise Fuller is an 89 y.o. female with past medical history significant for vitamin B12 deficiency, skin cancer, chronic venous insufficiency, diverticulosis, anxiety, hyperlipidemia, hypertension, hypothyroidism, macular degeneration, Mnire's disease, hearing loss, MGUS, and recently diagnosed vaginal cancer.  She presents today for Port-A-Cath placement to assist with treatment.  *** Patient is Full Code  Past Medical History:  Diagnosis Date   Abnormal SPEP 07/16/2019   B12 deficiency 07/16/2019   Bartonella infection    BCC (basal cell carcinoma of skin) 1997   nose   Chickenpox    Chronic bilateral low back pain without sciatica 06/23/2019   Chronic venous insufficiency    Diverticulosis    Dyspnea on exertion 07/10/2019   Elevated alkaline phosphatase level    Elevated LDH    Elevated serum immunoglobulin free light chains 07/21/2019   Environmental allergies    GAD (generalized anxiety disorder)    Goals of care, counseling/discussion 08/28/2019   Groin abscess    culture negative, doxy resolved abscess   Hepatitis A    Hyperlipidemia    Hypertension    Hypothyroidism    Ingrown nail 04/10/2018   Lightheadedness 12/03/2018   Lymphadenopathy, cervical    chronic per pt. (left)   Macular degeneration    Meniere disease    MGUS (monoclonal gammopathy of unknown significance) 08/28/2019   Other fatigue 12/03/2018   Overweight (BMI 25.0-29.9) 04/10/2018   Palpitations 12/03/2018   Thyroid  disease    Weakness generalized 12/03/2018    Past Surgical History:  Procedure Laterality Date   ABDOMINAL HYSTERECTOMY  1991   thinks BSO; done for AUB   APPENDECTOMY  1967   BASAL CELL CARCINOMA EXCISION  1997   nose   BLEPHAROPLASTY      CATARACT EXTRACTION W/ INTRAOCULAR LENS IMPLANT Bilateral Oct and Nov 2018   CHOLECYSTECTOMY  1967   ORIF WRIST FRACTURE Left 05/18/2020    Allergies: Keflex [cephalexin], Doxycycline, and Iron  Medications: Prior to Admission medications  Medication Sig Start Date End Date Taking? Authorizing Provider  Apoaequorin (PREVAGEN EXTRA STRENGTH PO) Take by mouth.    [provider]  Cranberry 500 MG CAPS Take by mouth daily.    [provider]  Cyanocobalamin  (B-12 COMPLIANCE INJECTION) 1000 MCG/ML KIT Inject 1,000 mcg as directed every 30 (thirty) days. 08/28/2019 Weekly    [provider]  diclofenac  (VOLTAREN ) 75 MG EC tablet Take 75 mg by mouth daily. 01/21/24   [provider]  estradiol  (ESTRACE ) 0.1 MG/GM vaginal cream Apply to vaginal area using a pea size amount on finger tip 3x weekly Patient not taking: Reported on 01/29/2024 09/07/22   Shona Layman BROCKS, MD  levothyroxine  (SYNTHROID ) 100 MCG tablet Take 1 tablet (100 mcg total) by mouth daily before breakfast. 11/28/23   [name], Renee A, DO  lidocaine -prilocaine  (EMLA ) cream Apply to affected area once 02/11/24   Lonn Hicks, MD  lisinopril  (ZESTRIL ) 20 MG tablet Take 1 tablet (20 mg total) by mouth daily. 11/27/23   [name], Renee A, DO  Multiple Vitamins-Minerals (MULTIPLE VITAMINS/WOMENS PO) Take 1 tablet by mouth daily. Unknown strength    [provider]  Multiple Vitamins-Minerals (PRESERVISION AREDS PO) Take by mouth.    [provider]  ondansetron  (ZOFRAN ) 8 MG tablet Take 1 tablet (8  mg total) by mouth every 8 (eight) hours as needed for nausea or vomiting. Start on the third day after carboplatin . 02/11/24   Lonn Hicks, MD  Polyethyl Glycol-Propyl Glycol (SYSTANE OP) Apply 1 drop to eye as needed (Eye dryness).    [provider]  prochlorperazine  (COMPAZINE ) 10 MG tablet Take 1 tablet (10 mg total) by mouth every 6 (six) hours as needed for nausea or vomiting.  02/11/24   Lonn Hicks, MD  tiZANidine  (ZANAFLEX ) 4 MG tablet Take 0.5 tablets (2 mg total) by mouth at bedtime. 09/16/23   Catherine Charlies LABOR, DO     Family History  Problem Relation Age of Onset   Arthritis Mother    Heart disease Mother    Arthritis Father    Aneurysm Father 30       brain   Alzheimer's disease Father    Thyroid  disease Daughter    Stroke Daughter        2020   Atrial fibrillation Son        2020    Social History   Socioeconomic History   Marital status: Married    Spouse name: Elgin   Number of children: 3   Years of education: Not on file   Highest education level: Not on file  Occupational History   Occupation: retired  Tobacco Use   Smoking status: Never   Smokeless tobacco: Never  Vaping Use   Vaping status: Never Used  Substance and Sexual Activity   Alcohol use: No   Drug use: No   Sexual activity: Yes    Partners: Male    Comment: married  Other Topics Concern   Not on file  Social History Narrative   Retired. Married to Nelson. They have 3 children Lawrance Mickey Arnulfo [name])   Moved from [place]  05/2014.    Has 2 older dogs.    Retired.    Drinks caffeine, takes herbal remedies and dialy vitamin.   Wears her seatbelt, smoke detector in the home   Wears dentures, no assistive devices for walking - independent.    Feels safe in her relationships.    Social Drivers of Health   Tobacco Use: Low Risk (02/11/2024)   Patient History    Smoking Tobacco Use: Never    Smokeless Tobacco Use: Never    Passive Exposure: Not on file  Financial Resource Strain: Low Risk (10/23/2023)   Overall Financial Resource Strain (CARDIA)    Difficulty of Paying Living Expenses: Not hard at all  Food Insecurity: No Food Insecurity (10/23/2023)   Epic    Worried About Programme Researcher, Broadcasting/film/video in the Last Year: Never true    Ran Out of Food in the Last Year: Never true  Transportation Needs: No Transportation Needs (10/23/2023)   Epic    Lack of Transportation  (Medical): No    Lack of Transportation (Non-Medical): No  Physical Activity: Sufficiently Active (10/23/2023)   Exercise Vital Sign    Days of Exercise per Week: 5 days    Minutes of Exercise per Session: 30 min  Stress: No Stress Concern Present (10/23/2023)   [employer] of Occupational Health - Occupational Stress Questionnaire    Feeling of Stress: Not at all  Social Connections: Moderately Isolated (10/23/2023)   Social Connection and Isolation Panel    Frequency of Communication with Friends and Family: More than three times a week    Frequency of Social Gatherings with Friends and Family: More than three times a week  Attends Religious Services: Never    Active Member of Clubs or Organizations: No    Attends Banker Meetings: Never    Marital Status: Married  Depression (PHQ2-9): Low Risk (11/27/2023)   Depression (PHQ2-9)    PHQ-2 Score: 0  Recent Concern: Depression (PHQ2-9) - Medium Risk (10/23/2023)   Depression (PHQ2-9)    PHQ-2 Score: 7  Alcohol Screen: Low Risk (10/23/2023)   Alcohol Screen    Last Alcohol Screening Score (AUDIT): 0  Housing: Unknown (10/23/2023)   Epic    Unable to Pay for Housing in the Last Year: No    Number of Times Moved in the Last Year: Not on file    Homeless in the Last Year: No  Utilities: Not At Risk (10/23/2023)   Epic    Threatened with loss of utilities: No  Health Literacy: Adequate Health Literacy (10/23/2023)   B1300 Health Literacy    Frequency of need for help with medical instructions: Never       Review of Systems  Vital Signs:  Advance Care Plan: No documents on file   Physical Exam  Imaging: MR PELVIS W WO CONTRAST Result Date: 01/27/2024 EXAM: MRI OF THE PELVIS WITH AND WITHOUT INTRAVENOUS CONTRAST 01/23/2024 05:32:59 PM TECHNIQUE: Multiplanar magnetic resonance images of the pelvis with and without intravenous contrast. CONTRAST: 6 mL of Gadavist . COMPARISON: None available. CLINICAL HISTORY:  Vaginal mass. FINDINGS: BLADDER / URETHRA: Bladder is nondistended. Urethra is grossly normal. LYMPH NODES: Mildly prominent right anterior perirectal 0.9 cm lymph node (series 3, image 26). Heterogeneously enhancing mildly enlarged 1.2 cm node in the right sciatic foramen (series 21, image 30). No pathologically enlarged left pelvic nodes. VESSELS: No acute vascular abnormality. UTERUS / VAGINA: Status post hysterectomy. Right vaginal wall mass measures 4.9 x 3.2 x 4.0 cm (series 9, image 12 and series 3, image 28), extending from the vaginal cuff superiorly to within 1.0 cm of the introitus at the inferior margin. Mild wall thickening and signal abnormality in the posterior right bladder wall, with loss of the normal fat plane between the right vaginal wall and posterior right bladder wall, cannot exclude posterior right bladder wall invasion by the vaginal mass (series 9, images 12-15). OVARIES / ADNEXA: No adnexal masses. BONES: Moderate degenerative disc disease in the lower lumbar spine. No acute fracture or focal osseous lesion. OTHER: Marked sigmoid diverticulosis. No small or large bowel wall thickening in the pelvis. No pelvic ascites. No fluid collections. IMPRESSION: 1. 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 Possible posterior right bladder wall invasion, see comments. 2. Mildly prominent right anterior perirectal lymph node and a heterogeneously enhancing mildly enlarged node in the right sciatic foramen, suspicious for right pelvic nodal metastatic disease. No left pelvic adenopathy. 3. Marked sigmoid diverticulosis. Electronically signed by: Selinda Blue MD 01/27/2024 12:35 PM EST RP Workstation: [id number]    Labs:  CBC: Recent Labs    06/12/23 0909 09/27/23 0921 11/27/23 0904  WBC 6.5 7.2 6.3  HGB 13.2 13.1 13.4  HCT 40.2 40.6 41.2  PLT 274.0  279.0 260.0    COAGS: No results for input(s): INR, APTT in the last 8760 hours.  BMP: Recent Labs    06/12/23 0909 11/27/23 0904  NA 139 141  K 3.9 3.6  CL 103 103  CO2 30 31  GLUCOSE 77 98  BUN 12  12  CALCIUM 9.0 9.0  CREATININE 0.75 0.72    LIVER FUNCTION TESTS: Recent Labs    06/12/23 0909 11/27/23 0904  BILITOT 0.5 0.5  AST 19 19  ALT 14 15  ALKPHOS 146* 107  PROT 6.1  6.3 6.4  6.3  ALBUMIN 4.1 4.0    TUMOR MARKERS: No results for input(s): AFPTM, CEA, CA199, CHROMGRNA in the last 8760 hours.  Assessment and Plan: 89 y.o. female with past medical history significant for vitamin B12 deficiency, skin cancer, chronic venous insufficiency, diverticulosis, anxiety, hyperlipidemia, hypertension, hypothyroidism, macular degeneration, Mnire's disease, hearing loss, MGUS, and recently diagnosed vaginal cancer.  She presents today for Port-A-Cath placement to assist with treatment.Risks and benefits of image guided port-a-catheter placement was discussed with the patient including, but not limited to bleeding, infection, pneumothorax, or fibrin sheath development and need for additional procedures.  All of the patient's questions were answered, patient is agreeable to proceed. Consent signed and in chart.    Thank you for allowing our service to participate in SHANTEL HELWIG 's care.  Electronically Signed: D. Franky Rakers, PA-C   02/18/2024, 2:25 PM      I spent a total of  20 minutes   in face to face in clinical consultation, greater than 50% of which was counseling/coordinating care for port a cath placement   "

## 2024-02-19 ENCOUNTER — Other Ambulatory Visit: Payer: Self-pay

## 2024-02-19 ENCOUNTER — Ambulatory Visit (HOSPITAL_COMMUNITY)
Admission: RE | Admit: 2024-02-19 | Discharge: 2024-02-19 | Disposition: A | Discharge status: Home / Self Care | Source: Ambulatory Visit | Attending: Hematology and Oncology | Admitting: Hematology and Oncology

## 2024-02-19 ENCOUNTER — Encounter (HOSPITAL_COMMUNITY): Payer: Self-pay

## 2024-02-19 DIAGNOSIS — I10 Essential (primary) hypertension: Secondary | ICD-10-CM | POA: Insufficient documentation

## 2024-02-19 DIAGNOSIS — E039 Hypothyroidism, unspecified: Secondary | ICD-10-CM | POA: Diagnosis not present

## 2024-02-19 DIAGNOSIS — E785 Hyperlipidemia, unspecified: Secondary | ICD-10-CM | POA: Insufficient documentation

## 2024-02-19 DIAGNOSIS — Z79890 Hormone replacement therapy (postmenopausal): Secondary | ICD-10-CM | POA: Insufficient documentation

## 2024-02-19 DIAGNOSIS — Z79899 Other long term (current) drug therapy: Secondary | ICD-10-CM | POA: Insufficient documentation

## 2024-02-19 DIAGNOSIS — Z85828 Personal history of other malignant neoplasm of skin: Secondary | ICD-10-CM | POA: Insufficient documentation

## 2024-02-19 DIAGNOSIS — I872 Venous insufficiency (chronic) (peripheral): Secondary | ICD-10-CM | POA: Diagnosis not present

## 2024-02-19 DIAGNOSIS — C52 Malignant neoplasm of vagina: Secondary | ICD-10-CM | POA: Diagnosis present

## 2024-02-19 HISTORY — PX: IR IMAGING GUIDED PORT INSERTION: IMG5740

## 2024-02-19 MED ORDER — HEPARIN SOD (PORK) LOCK FLUSH 100 UNIT/ML IV SOLN
500.0000 [IU] | Freq: Once | INTRAVENOUS | Status: AC
  Administered 2024-02-19: 500 [IU] via INTRAVENOUS

## 2024-02-19 MED ORDER — LIDOCAINE-EPINEPHRINE 1 %-1:100000 IJ SOLN
20.0000 mL | Freq: Once | INTRAMUSCULAR | Status: AC
  Administered 2024-02-19: 15 mL via INTRADERMAL

## 2024-02-19 MED ORDER — LIDOCAINE-EPINEPHRINE 1 %-1:100000 IJ SOLN
INTRAMUSCULAR | Status: AC
  Filled 2024-02-19: qty 20

## 2024-02-19 MED ORDER — SODIUM CHLORIDE 0.9 % IV SOLN: INTRAVENOUS | Status: DC

## 2024-02-19 MED ORDER — FENTANYL CITRATE (PF) 100 MCG/2ML IJ SOLN
INTRAMUSCULAR | Status: AC | PRN
  Administered 2024-02-19: 50 ug via INTRAVENOUS

## 2024-02-19 MED ORDER — MIDAZOLAM HCL (PF) 2 MG/2ML IJ SOLN
INTRAMUSCULAR | Status: AC | PRN
  Administered 2024-02-19: 1 mg via INTRAVENOUS

## 2024-02-19 MED ORDER — MIDAZOLAM HCL 2 MG/2ML IJ SOLN
INTRAMUSCULAR | Status: AC
  Filled 2024-02-19: qty 2

## 2024-02-19 MED ORDER — HEPARIN SOD (PORK) LOCK FLUSH 100 UNIT/ML IV SOLN
INTRAVENOUS | Status: AC
  Filled 2024-02-19: qty 5

## 2024-02-19 MED ORDER — FENTANYL CITRATE (PF) 100 MCG/2ML IJ SOLN
INTRAMUSCULAR | Status: AC
  Filled 2024-02-19: qty 2

## 2024-02-19 NOTE — Procedures (Signed)
Interventional Radiology Procedure Note  Procedure: RT internal jugular POWER PORT    Complications: None  Estimated Blood Loss:  MIN  Findings: TIP SVCRA    M. TREVOR Levaeh Vice, MD    

## 2024-02-19 NOTE — Discharge Instructions (Addendum)
 Discharge Instructions:   Please call Interventional Radiology clinic [phone] with any questions or concerns.  You may remove your dressing and shower tomorrow.  Do not use EMLA  / Lidocaine  cream for 2 weeks post Port Insertion this will remove the surgical glue from the incision.   Moderate Conscious Sedation, Adult, Care After  This sheet gives you information about how to care for yourself after your procedure. Your health care provider may also give you more specific instructions. If you have problems or questions, contact your health care provider. What can I expect after the procedure? After the procedure, it is common to have: Sleepiness for several hours. Impaired judgment for several hours. Difficulty with balance. Vomiting if you eat too soon. Follow these instructions at home: For the time period you were told by your health care provider: Rest. Do not participate in activities where you could fall or become injured. Do not drive or use machinery. Do not drink alcohol. Do not take sleeping pills or medicines that cause drowsiness. Do not make important decisions or sign legal documents. Do not take care of children on your own. Eating and drinking  Follow the diet recommended by your health care provider. Drink enough fluid to keep your urine pale yellow. If you vomit: Drink water , juice, or soup when you can drink without vomiting. Make sure you have little or no nausea before eating solid foods. General instructions Take over-the-counter and prescription medicines only as told by your health care provider. Have a responsible adult stay with you for the time you are told. It is important to have someone help care for you until you are awake and alert. Do not smoke. Keep all follow-up visits as told by your health care provider. This is important. Contact a health care provider if: You are still sleepy or having trouble with balance after 24 hours. You feel  light-headed. You keep feeling nauseous or you keep vomiting. You develop a rash. You have a fever. You have redness or swelling around the IV site. Get help right away if: You have trouble breathing. You have new-onset confusion at home. Summary After the procedure, it is common to feel sleepy, have impaired judgment, or feel nauseous if you eat too soon. Rest after you get home. Know the things you should not do after the procedure. Follow the diet recommended by your health care provider and drink enough fluid to keep your urine pale yellow. Get help right away if you have trouble breathing or new-onset confusion at home. This information is not intended to replace advice given to you by your health care provider. Make sure you discuss any questions you have with your health care provider. Document Revised: 05/22/2019 Document Reviewed: 12/18/2018 Elsevier Patient Education  2023 Elsevier Inc.    Implanted [place] Insertion, Care After  The following information offers guidance on how to care for yourself after your procedure. Your health care provider may also give you more specific instructions. If you have problems or questions, contact your health care provider. What can I expect after the procedure? After the procedure, it is common to have: Discomfort at the port insertion site. Bruising on the skin over the port. This should improve over 3-4 days. Follow these instructions at home: [facility] care After your port is placed, you will get a manufacturer's information card. The card has information about your port. Keep this card with you at all times. Take care of the port as told by your health care provider.  Ask your health care provider if you or a family member can get training for taking care of the port at home. A home health care nurse will be be available to help care for the port. Make sure to remember what type of port you have. Incision care     Follow instructions from  your health care provider about how to take care of your port insertion site. Make sure you: Wash your hands with soap and water  for at least 20 seconds before and after you change your bandage (dressing). If soap and water  are not available, use hand sanitizer. Change your dressing as told by your health care provider. Leave stitches (sutures), skin glue, or adhesive strips in place. These skin closures may need to stay in place for 2 weeks or longer. If adhesive strip edges start to loosen and curl up, you may trim the loose edges. Do not remove adhesive strips completely unless your health care provider tells you to do that. Check your port insertion site every day for signs of infection. Check for: Redness, swelling, or pain. Fluid or blood. Warmth. Pus or a bad smell. Activity Return to your normal activities as told by your health care provider. Ask your health care provider what activities are safe for you. You may have to avoid lifting. Ask your health care provider how much you can safely lift. General instructions Take over-the-counter and prescription medicines only as told by your health care provider. Do not take baths, swim, or use a hot tub until your health care provider approves. Ask your health care provider if you may take showers. You may only be allowed to take sponge baths. If you were given a sedative during the procedure, it can affect you for several hours. Do not drive or operate machinery until your health care provider says that it is safe. Wear a medical alert bracelet in case of an emergency. This will tell any health care providers that you have a port. Keep all follow-up visits. This is important. Contact a health care provider if: You cannot flush your port with saline as directed, or you cannot draw blood from the port. You have a fever or chills. You have redness, swelling, or pain around your port insertion site. You have fluid or blood coming from your port  insertion site. Your port insertion site feels warm to the touch. You have pus or a bad smell coming from the port insertion site. Get help right away if: You have chest pain or shortness of breath. You have bleeding from your port that you cannot control. These symptoms may be an emergency. Get help right away. Call 911. Do not wait to see if the symptoms will go away. Do not drive yourself to the hospital. Summary Take care of the port as told by your health care provider. Keep the manufacturer's information card with you at all times. Change your dressing as told by your health care provider. Contact a health care provider if you have a fever or chills or if you have redness, swelling, or pain around your port insertion site. Keep all follow-up visits. This information is not intended to replace advice given to you by your health care provider. Make sure you discuss any questions you have with your health care provider. Document Revised: 07/26/2020 Document Reviewed: 07/26/2020 Elsevier Patient Education  2023 [employer].

## 2024-02-19 NOTE — Sedation Documentation (Signed)
 RN Christoffer Currier pulled 2 mg Versed  and 100 mcg Fentanyl  in IR room. Pt. Received 2 mg Versed  and 100 mcg Fentanyl  throughout the procedure.

## 2024-02-19 NOTE — Progress Notes (Signed)
 1050 Ice bag given to use at home to right upper neck and right upper chest as instructed.

## 2024-02-20 ENCOUNTER — Inpatient Hospital Stay

## 2024-02-20 DIAGNOSIS — Z5112 Encounter for antineoplastic immunotherapy: Secondary | ICD-10-CM | POA: Diagnosis not present

## 2024-02-20 DIAGNOSIS — C52 Malignant neoplasm of vagina: Secondary | ICD-10-CM

## 2024-02-20 LAB — CBC WITH DIFFERENTIAL (CANCER CENTER ONLY)
Abs Immature Granulocytes: 0.03 K/uL (ref 0.00–0.07)
Basophils Absolute: 0.1 K/uL (ref 0.0–0.1)
Basophils Relative: 1 %
Eosinophils Absolute: 0.2 K/uL (ref 0.0–0.5)
Eosinophils Relative: 2 %
HCT: 41.6 % (ref 36.0–46.0)
Hemoglobin: 14 g/dL (ref 12.0–15.0)
Immature Granulocytes: 0 %
Lymphocytes Relative: 20 %
Lymphs Abs: 1.8 K/uL (ref 0.7–4.0)
MCH: 30.2 pg (ref 26.0–34.0)
MCHC: 33.7 g/dL (ref 30.0–36.0)
MCV: 89.8 fL (ref 80.0–100.0)
Monocytes Absolute: 1 K/uL (ref 0.1–1.0)
Monocytes Relative: 11 %
Neutro Abs: 5.7 K/uL (ref 1.7–7.7)
Neutrophils Relative %: 66 %
Platelet Count: 267 K/uL (ref 150–400)
RBC: 4.63 MIL/uL (ref 3.87–5.11)
RDW: 13.5 % (ref 11.5–15.5)
WBC Count: 8.7 K/uL (ref 4.0–10.5)
nRBC: 0 % (ref 0.0–0.2)

## 2024-02-20 LAB — CMP (CANCER CENTER ONLY)
ALT: 18 U/L (ref 0–44)
AST: 26 U/L (ref 15–41)
Albumin: 4.3 g/dL (ref 3.5–5.0)
Alkaline Phosphatase: 160 U/L — ABNORMAL HIGH (ref 38–126)
Anion gap: 9 (ref 5–15)
BUN: 16 mg/dL (ref 8–23)
CO2: 29 mmol/L (ref 22–32)
Calcium: 9.8 mg/dL (ref 8.9–10.3)
Chloride: 103 mmol/L (ref 98–111)
Creatinine: 0.73 mg/dL (ref 0.44–1.00)
GFR, Estimated: >60 mL/min
Glucose, Bld: 103 mg/dL — ABNORMAL HIGH (ref 70–99)
Potassium: 4.2 mmol/L (ref 3.5–5.1)
Sodium: 141 mmol/L (ref 135–145)
Total Bilirubin: 0.3 mg/dL (ref 0.0–1.2)
Total Protein: 7.2 g/dL (ref 6.5–8.1)

## 2024-02-20 LAB — TSH: TSH: 3.68 u[IU]/mL (ref 0.350–4.500)

## 2024-02-20 MED FILL — Fosaprepitant Dimeglumine For IV Infusion 150 MG (Base Eq): INTRAVENOUS | Qty: 5 | Status: AC

## 2024-02-21 ENCOUNTER — Inpatient Hospital Stay

## 2024-02-21 VITALS — BP 128/72 | HR 78 | Temp 98.6°F | Resp 16

## 2024-02-21 DIAGNOSIS — Z5112 Encounter for antineoplastic immunotherapy: Secondary | ICD-10-CM | POA: Diagnosis not present

## 2024-02-21 DIAGNOSIS — C52 Malignant neoplasm of vagina: Secondary | ICD-10-CM

## 2024-02-21 LAB — T4: T4, Total: 9.6 ug/dL (ref 4.5–12.0)

## 2024-02-21 MED ORDER — FAMOTIDINE IN NACL 20-0.9 MG/50ML-% IV SOLN
20.0000 mg | Freq: Once | INTRAVENOUS | Status: AC
  Administered 2024-02-21: 20 mg via INTRAVENOUS
  Filled 2024-02-21: qty 50

## 2024-02-21 MED ORDER — SODIUM CHLORIDE 0.9 % IV SOLN
200.0000 mg | Freq: Once | INTRAVENOUS | Status: AC
  Administered 2024-02-21: 200 mg via INTRAVENOUS
  Filled 2024-02-21: qty 200

## 2024-02-21 MED ORDER — DEXAMETHASONE SOD PHOSPHATE PF 10 MG/ML IJ SOLN
10.0000 mg | Freq: Once | INTRAMUSCULAR | Status: AC
  Administered 2024-02-21: 10 mg via INTRAVENOUS
  Filled 2024-02-21: qty 1

## 2024-02-21 MED ORDER — SODIUM CHLORIDE 0.9% FLUSH
10.0000 mL | INTRAVENOUS | Status: DC | PRN
  Administered 2024-02-21: 10 mL

## 2024-02-21 MED ORDER — SODIUM CHLORIDE 0.9 % IV SOLN
308.0000 mg | Freq: Once | INTRAVENOUS | Status: AC
  Administered 2024-02-21: 310 mg via INTRAVENOUS
  Filled 2024-02-21: qty 31

## 2024-02-21 MED ORDER — PALONOSETRON HCL INJECTION 0.25 MG/5ML
0.2500 mg | Freq: Once | INTRAVENOUS | Status: AC
  Administered 2024-02-21: 0.25 mg via INTRAVENOUS
  Filled 2024-02-21: qty 5

## 2024-02-21 MED ORDER — SODIUM CHLORIDE 0.9 % IV SOLN
150.0000 mg | Freq: Once | INTRAVENOUS | Status: AC
  Administered 2024-02-21: 150 mg via INTRAVENOUS
  Filled 2024-02-21: qty 150

## 2024-02-21 MED ORDER — SODIUM CHLORIDE 0.9 % IV SOLN: INTRAVENOUS | Status: DC

## 2024-02-21 NOTE — Addendum Note (Signed)
 Addended by: CLAUDENE SHANDA ORN on: 02/21/2024 01:17 PM   Modules accepted: Orders

## 2024-02-21 NOTE — Patient Instructions (Addendum)
 [facility] - A DEPT OF [facility]  Discharge Instructions: Thank you for choosing [facility] to provide your oncology and hematology care.   If you have a lab appointment with the Cancer Center, please go directly to the Cancer Center and check in at the registration area.   Wear comfortable clothing and clothing appropriate for easy access to any Portacath or PICC line.   We strive to give you quality time with your provider. You may need to reschedule your appointment if you arrive late (15 or more minutes).  Arriving late affects you and other patients whose appointments are after yours.  Also, if you miss three or more appointments without notifying the office, you may be dismissed from the clinic at the provider's discretion.      For prescription refill requests, have your pharmacy contact our office and allow 72 hours for refills to be completed.    Today you received the following chemotherapy and/or immunotherapy agents: Keytruda/Carboplatin      To help prevent nausea and vomiting after your treatment, we encourage you to take your nausea medication as directed.  BELOW ARE SYMPTOMS THAT SHOULD BE REPORTED IMMEDIATELY: *FEVER GREATER THAN 100.4 F (38 C) OR HIGHER *CHILLS OR SWEATING *NAUSEA AND VOMITING THAT IS NOT CONTROLLED WITH YOUR NAUSEA MEDICATION *UNUSUAL SHORTNESS OF BREATH *UNUSUAL BRUISING OR BLEEDING *URINARY PROBLEMS (pain or burning when urinating, or frequent urination) *BOWEL PROBLEMS (unusual diarrhea, constipation, pain near the anus) TENDERNESS IN MOUTH AND THROAT WITH OR WITHOUT PRESENCE OF ULCERS (sore throat, sores in mouth, or a toothache) UNUSUAL RASH, SWELLING OR PAIN  UNUSUAL VAGINAL DISCHARGE OR ITCHING   Items with * indicate a potential emergency and should be followed up as soon as possible or go to the Emergency Department if any problems should occur.  Please show the CHEMOTHERAPY ALERT CARD or  IMMUNOTHERAPY ALERT CARD at check-in to the Emergency Department and triage nurse.  Should you have questions after your visit or need to cancel or reschedule your appointment, please contact [facility]  Dept: [phone]  and follow the prompts.  Office hours are 8:00 a.m. to 4:30 p.m. Monday - Friday. Please note that voicemails left after 4:00 p.m. may not be returned until the following business day.  We are closed weekends and major holidays. You have access to a nurse at all times for urgent questions. Please call the main number to the clinic Dept: [phone] and follow the prompts.   For any non-urgent questions, you may also contact your provider using MyChart. We now offer e-Visits for anyone 44 and older to request care online for non-urgent symptoms. For details visit [email or web address].   Also download the MyChart app! Go to the app store, search "MyChart", open the app, select [facility], and log in with your MyChart username and password.

## 2024-02-24 ENCOUNTER — Ambulatory Visit: Admitting: Radiation Oncology

## 2024-02-24 ENCOUNTER — Telehealth: Payer: Self-pay

## 2024-02-24 NOTE — Telephone Encounter (Signed)
-----   Message from Nurse Elenor ORN, RN sent at 02/21/2024 10:55 AM EST ----- Regarding: Dr. Lonn 1st time Keytruda Dennice f/u tol well Dr. Lonn 1st time Keytruda /Carboplatin  tolerated tx well without incident. Pt call back due.

## 2024-02-25 ENCOUNTER — Ambulatory Visit

## 2024-02-26 ENCOUNTER — Ambulatory Visit

## 2024-02-27 ENCOUNTER — Ambulatory Visit

## 2024-02-28 ENCOUNTER — Ambulatory Visit

## 2024-03-02 ENCOUNTER — Ambulatory Visit

## 2024-03-03 ENCOUNTER — Ambulatory Visit

## 2024-03-04 ENCOUNTER — Ambulatory Visit

## 2024-03-05 ENCOUNTER — Ambulatory Visit

## 2024-03-06 ENCOUNTER — Ambulatory Visit: Payer: Self-pay

## 2024-03-06 ENCOUNTER — Ambulatory Visit

## 2024-03-06 DIAGNOSIS — E538 Deficiency of other specified B group vitamins: Secondary | ICD-10-CM

## 2024-03-06 MED ORDER — CYANOCOBALAMIN 1000 MCG/ML IJ SOLN
1000.0000 ug | Freq: Once | INTRAMUSCULAR | Status: AC
  Administered 2024-03-06: 1000 ug via INTRAMUSCULAR

## 2024-03-06 NOTE — Progress Notes (Signed)
 Pt here for monthly B12 injection per Dr. Catherine   Injection given R arm IM, pt tolerated injection well.    Next injection scheduled for: 1 month.

## 2024-03-12 MED FILL — Fosaprepitant Dimeglumine For IV Infusion 150 MG (Base Eq): INTRAVENOUS | Qty: 5 | Status: AC

## 2024-03-13 ENCOUNTER — Inpatient Hospital Stay

## 2024-03-13 ENCOUNTER — Inpatient Hospital Stay: Attending: Hematology and Oncology | Admitting: Hematology and Oncology

## 2024-03-13 VITALS — BP 158/83 | HR 80 | Temp 97.7°F | Resp 18 | Ht 63.0 in | Wt 124.8 lb

## 2024-03-13 VITALS — BP 143/78 | HR 79 | Resp 16

## 2024-03-13 DIAGNOSIS — N1831 Chronic kidney disease, stage 3a: Secondary | ICD-10-CM

## 2024-03-13 DIAGNOSIS — C52 Malignant neoplasm of vagina: Secondary | ICD-10-CM

## 2024-03-13 LAB — CMP (CANCER CENTER ONLY)
ALT: 15 U/L (ref 0–44)
AST: 19 U/L (ref 15–41)
Albumin: 4.1 g/dL (ref 3.5–5.0)
Alkaline Phosphatase: 156 U/L — ABNORMAL HIGH (ref 38–126)
Anion gap: 10 (ref 5–15)
BUN: 14 mg/dL (ref 8–23)
CO2: 25 mmol/L (ref 22–32)
Calcium: 9 mg/dL (ref 8.9–10.3)
Chloride: 106 mmol/L (ref 98–111)
Creatinine: 0.58 mg/dL (ref 0.44–1.00)
GFR, Estimated: >60 mL/min
Glucose, Bld: 96 mg/dL (ref 70–99)
Potassium: 3.9 mmol/L (ref 3.5–5.1)
Sodium: 141 mmol/L (ref 135–145)
Total Bilirubin: 0.3 mg/dL (ref 0.0–1.2)
Total Protein: 6.5 g/dL (ref 6.5–8.1)

## 2024-03-13 LAB — CBC WITH DIFFERENTIAL (CANCER CENTER ONLY)
Abs Immature Granulocytes: 0.01 10*3/uL (ref 0.00–0.07)
Basophils Absolute: 0.1 10*3/uL (ref 0.0–0.1)
Basophils Relative: 1 %
Eosinophils Absolute: 0.1 10*3/uL (ref 0.0–0.5)
Eosinophils Relative: 2 %
HCT: 39.2 % (ref 36.0–46.0)
Hemoglobin: 13.1 g/dL (ref 12.0–15.0)
Immature Granulocytes: 0 %
Lymphocytes Relative: 36 %
Lymphs Abs: 2.3 10*3/uL (ref 0.7–4.0)
MCH: 29.9 pg (ref 26.0–34.0)
MCHC: 33.4 g/dL (ref 30.0–36.0)
MCV: 89.5 fL (ref 80.0–100.0)
Monocytes Absolute: 0.7 10*3/uL (ref 0.1–1.0)
Monocytes Relative: 10 %
Neutro Abs: 3.4 10*3/uL (ref 1.7–7.7)
Neutrophils Relative %: 51 %
Platelet Count: 176 10*3/uL (ref 150–400)
RBC: 4.38 MIL/uL (ref 3.87–5.11)
RDW: 14 % (ref 11.5–15.5)
WBC Count: 6.5 10*3/uL (ref 4.0–10.5)
nRBC: 0 % (ref 0.0–0.2)

## 2024-03-13 MED ORDER — SODIUM CHLORIDE 0.9 % IV SOLN: INTRAVENOUS | Status: DC

## 2024-03-13 MED ORDER — SODIUM CHLORIDE 0.9 % IV SOLN
200.0000 mg | Freq: Once | INTRAVENOUS | Status: AC
  Administered 2024-03-13: 200 mg via INTRAVENOUS
  Filled 2024-03-13: qty 200

## 2024-03-13 MED ORDER — PALONOSETRON HCL INJECTION 0.25 MG/5ML
0.2500 mg | Freq: Once | INTRAVENOUS | Status: AC
  Administered 2024-03-13: 0.25 mg via INTRAVENOUS
  Filled 2024-03-13: qty 5

## 2024-03-13 MED ORDER — DEXAMETHASONE SOD PHOSPHATE PF 10 MG/ML IJ SOLN
10.0000 mg | Freq: Once | INTRAMUSCULAR | Status: AC
  Administered 2024-03-13: 10 mg via INTRAVENOUS
  Filled 2024-03-13: qty 1

## 2024-03-13 MED ORDER — SODIUM CHLORIDE 0.9 % IV SOLN
150.0000 mg | Freq: Once | INTRAVENOUS | Status: AC
  Administered 2024-03-13: 150 mg via INTRAVENOUS
  Filled 2024-03-13: qty 150

## 2024-03-13 MED ORDER — SODIUM CHLORIDE 0.9 % IV SOLN
308.0000 mg | Freq: Once | INTRAVENOUS | Status: AC
  Administered 2024-03-13: 310 mg via INTRAVENOUS
  Filled 2024-03-13: qty 31

## 2024-03-13 MED ORDER — FAMOTIDINE IN NACL 20-0.9 MG/50ML-% IV SOLN
20.0000 mg | Freq: Once | INTRAVENOUS | Status: AC
  Administered 2024-03-13: 20 mg via INTRAVENOUS
  Filled 2024-03-13: qty 50

## 2024-03-13 NOTE — Assessment & Plan Note (Addendum)
 The patient was diagnosed with vaginal cancer after presentation with vaginal bleeding in August 2025 Subsequent imaging study revealed locally advanced malignancy with vaginal mass measured close to 5 cm with possibility of posterior bladder wall invasion and suspicious regional lymphadenopathy Clinically, she has stage III disease Recent molecular testing reveal the cancer is PD-L1 positive, 25%  The patient has significant hearing loss at baseline The patient was treated with combination of carboplatin  with pembrolizumab  and she tolerated that very well with no side effects Her vaginal bleeding has resolved The only complication I could see is she has mild weight loss We will proceed with treatment without delay Plan to repeat imaging study after 3 cycles of treatment

## 2024-03-13 NOTE — Progress Notes (Signed)
 [facility] OFFICE PROGRESS NOTE  Patient Care Team: Catherine Charlies LABOR, DO as PCP - General (Family Medicine) Elspeth Lauraine DEL, OD as Referring Physician Vannie Hoover (Dentistry) Elisabeth Valli BIRCH, MD as Consulting Physician (Urology) Evern Donnajean Kayser, MD as Referring Physician (Orthopedic Surgery)  Assessment & Plan Vaginal cancer [facility]) The patient was diagnosed with vaginal cancer after presentation with vaginal bleeding in August 2025 Subsequent imaging study revealed locally advanced malignancy with vaginal mass measured close to 5 cm with possibility of posterior bladder wall invasion and suspicious regional lymphadenopathy Clinically, she has stage III disease Recent molecular testing reveal the cancer is PD-L1 positive, 25%  The patient has significant hearing loss at baseline The patient was treated with combination of carboplatin  with pembrolizumab  and she tolerated that very well with no side effects Her vaginal bleeding has resolved The only complication I could see is she has mild weight loss We will proceed with treatment without delay Plan to repeat imaging study after 3 cycles of treatment Stage 3a chronic kidney disease (HCC) We will adjust the dose of her treatment according to her renal function  No orders of the defined types were placed in this encounter.    Almarie Bedford, MD  INTERVAL HISTORY: she returns for treatment follow-up Complications related to previous cycle of chemotherapy included weight loss, Vaginal bleeding has resolved  PHYSICAL EXAMINATION: ECOG PERFORMANCE STATUS: 0 - Asymptomatic  No results found for: [id number]    Latest Ref Rng & Units 03/13/2024    9:33 AM 02/20/2024    8:12 AM 11/27/2023    9:04 AM  CBC  WBC 4.0 - 10.5 K/uL 6.5  8.7  6.3   Hemoglobin 12.0 - 15.0 g/dL 86.8  85.9  86.5   Hematocrit 36.0 - 46.0 % 39.2  41.6  41.2   Platelets 150 - 400 K/uL 176  267  260.0       Chemistry      Component Value  Date/Time   NA 141 03/13/2024 0933   K 3.9 03/13/2024 0933   CL 106 03/13/2024 0933   CO2 25 03/13/2024 0933   BUN 14 03/13/2024 0933   CREATININE 0.58 03/13/2024 0933   CREATININE 0.73 03/29/2022 0921      Component Value Date/Time   CALCIUM 9.0 03/13/2024 0933   ALKPHOS 156 (H) 03/13/2024 0933   AST 19 03/13/2024 0933   ALT 15 03/13/2024 0933   BILITOT 0.3 03/13/2024 0933       Vitals:   03/13/24 0956  BP: (!) 158/83  Pulse: 80  Resp: 18  Temp: 97.7 F (36.5 C)  SpO2: 100%   Filed Weights   03/13/24 0956  Weight: 124 lb 12.8 oz (56.6 kg)   Other relevant data reviewed during this visit included CBC, CMP, TSH

## 2024-03-13 NOTE — Assessment & Plan Note (Addendum)
We will adjust the dose of her treatment according to her renal function

## 2024-03-13 NOTE — Patient Instructions (Signed)
 [facility] - A DEPT OF [place]  Discharge Instructions: Thank you for choosing [facility] to provide your oncology and hematology care.   If you have a lab appointment with the Cancer Center, please go directly to the Cancer Center and check in at the registration area.   Wear comfortable clothing and clothing appropriate for easy access to any Portacath or PICC line.   We strive to give you quality time with your provider. You may need to reschedule your appointment if you arrive late (15 or more minutes).  Arriving late affects you and other patients whose appointments are after yours.  Also, if you miss three or more appointments without notifying the office, you may be dismissed from the clinic at the provider's discretion.      For prescription refill requests, have your pharmacy contact our office and allow 72 hours for refills to be completed.    Today you received the following chemotherapy and/or immunotherapy agents: pembrolizumab  and carboplatin       To help prevent nausea and vomiting after your treatment, we encourage you to take your nausea medication as directed.  BELOW ARE SYMPTOMS THAT SHOULD BE REPORTED IMMEDIATELY: *FEVER GREATER THAN 100.4 F (38 C) OR HIGHER *CHILLS OR SWEATING *NAUSEA AND VOMITING THAT IS NOT CONTROLLED WITH YOUR NAUSEA MEDICATION *UNUSUAL SHORTNESS OF BREATH *UNUSUAL BRUISING OR BLEEDING *URINARY PROBLEMS (pain or burning when urinating, or frequent urination) *BOWEL PROBLEMS (unusual diarrhea, constipation, pain near the anus) TENDERNESS IN MOUTH AND THROAT WITH OR WITHOUT PRESENCE OF ULCERS (sore throat, sores in mouth, or a toothache) UNUSUAL RASH, SWELLING OR PAIN  UNUSUAL VAGINAL DISCHARGE OR ITCHING   Items with * indicate a potential emergency and should be followed up as soon as possible or go to the Emergency Department if any problems should occur.  Please show the CHEMOTHERAPY ALERT  CARD or IMMUNOTHERAPY ALERT CARD at check-in to the Emergency Department and triage nurse.  Should you have questions after your visit or need to cancel or reschedule your appointment, please contact [facility] - A DEPT OF [facility]  Dept: [phone]  and follow the prompts.  Office hours are 8:00 a.m. to 4:30 p.m. Monday - Friday. Please note that voicemails left after 4:00 p.m. may not be returned until the following business day.  We are closed weekends and major holidays. You have access to a nurse at all times for urgent questions. Please call the main number to the clinic Dept: [phone] and follow the prompts.   For any non-urgent questions, you may also contact your provider using MyChart. We now offer e-Visits for anyone 58 and older to request care online for non-urgent symptoms. For details visit [email or web address].   Also download the MyChart app! Go to the app store, search MyChart, open the app, select [facility], and log in with your MyChart username and password.

## 2024-04-03 ENCOUNTER — Inpatient Hospital Stay

## 2024-04-03 ENCOUNTER — Inpatient Hospital Stay: Admitting: Hematology and Oncology

## 2024-04-24 ENCOUNTER — Inpatient Hospital Stay: Attending: Hematology and Oncology

## 2024-04-24 ENCOUNTER — Inpatient Hospital Stay

## 2024-04-24 ENCOUNTER — Inpatient Hospital Stay: Admitting: Hematology and Oncology

## 2024-10-28 ENCOUNTER — Ambulatory Visit
